# Patient Record
Sex: Female | Born: 1975
Health system: Southern US, Community
[De-identification: ages and names within clinical notes are randomized; demographics above are authoritative.]

## PROBLEM LIST (undated history)

## (undated) DIAGNOSIS — F411 Generalized anxiety disorder: Secondary | ICD-10-CM

## (undated) DIAGNOSIS — F419 Anxiety disorder, unspecified: Secondary | ICD-10-CM

## (undated) DIAGNOSIS — F3289 Other specified depressive episodes: Secondary | ICD-10-CM

## (undated) DIAGNOSIS — F329 Major depressive disorder, single episode, unspecified: Secondary | ICD-10-CM

## (undated) DIAGNOSIS — E119 Type 2 diabetes mellitus without complications: Secondary | ICD-10-CM

## (undated) DIAGNOSIS — I10 Essential (primary) hypertension: Secondary | ICD-10-CM

## (undated) DIAGNOSIS — K219 Gastro-esophageal reflux disease without esophagitis: Secondary | ICD-10-CM

## (undated) DIAGNOSIS — F410 Panic disorder [episodic paroxysmal anxiety] without agoraphobia: Secondary | ICD-10-CM

## (undated) DIAGNOSIS — E785 Hyperlipidemia, unspecified: Secondary | ICD-10-CM

## (undated) DIAGNOSIS — E669 Obesity, unspecified: Secondary | ICD-10-CM

## (undated) HISTORY — DX: Major depressive disorder, single episode, unspecified: F32.9

## (undated) HISTORY — DX: Hyperlipidemia, unspecified: E78.5

## (undated) HISTORY — DX: Panic disorder (episodic paroxysmal anxiety): F41.0

## (undated) HISTORY — DX: Gastro-esophageal reflux disease without esophagitis: K21.9

## (undated) HISTORY — DX: Obesity, unspecified: E66.9

## (undated) HISTORY — PX: NO PAST SURGERIES: SHX2092

## (undated) HISTORY — DX: Type 2 diabetes mellitus without complications: E11.9

## (undated) HISTORY — DX: Generalized anxiety disorder: F41.1

## (undated) HISTORY — DX: Other specified depressive episodes: F32.89

## (undated) HISTORY — DX: Anxiety disorder, unspecified: F41.9

---

## 1997-12-13 ENCOUNTER — Emergency Department (HOSPITAL_COMMUNITY): Admission: EM | Admit: 1997-12-13 | Discharge: 1997-12-13 | Payer: Self-pay | Admitting: Internal Medicine

## 1998-02-28 ENCOUNTER — Emergency Department (HOSPITAL_COMMUNITY): Admission: EM | Admit: 1998-02-28 | Discharge: 1998-02-28 | Payer: Self-pay

## 1998-03-04 ENCOUNTER — Emergency Department (HOSPITAL_COMMUNITY): Admission: EM | Admit: 1998-03-04 | Discharge: 1998-03-04 | Payer: Self-pay | Admitting: Internal Medicine

## 1998-05-04 ENCOUNTER — Emergency Department (HOSPITAL_COMMUNITY): Admission: EM | Admit: 1998-05-04 | Discharge: 1998-05-04 | Payer: Self-pay | Admitting: Emergency Medicine

## 1999-01-16 ENCOUNTER — Emergency Department (HOSPITAL_COMMUNITY): Admission: EM | Admit: 1999-01-16 | Discharge: 1999-01-16 | Payer: Self-pay | Admitting: Emergency Medicine

## 1999-07-01 ENCOUNTER — Emergency Department (HOSPITAL_COMMUNITY): Admission: EM | Admit: 1999-07-01 | Discharge: 1999-07-01 | Payer: Self-pay | Admitting: Emergency Medicine

## 2000-02-22 ENCOUNTER — Ambulatory Visit (HOSPITAL_COMMUNITY): Admission: RE | Admit: 2000-02-22 | Discharge: 2000-02-22 | Payer: Self-pay | Admitting: *Deleted

## 2000-03-22 ENCOUNTER — Inpatient Hospital Stay (HOSPITAL_COMMUNITY): Admission: AD | Admit: 2000-03-22 | Discharge: 2000-03-22 | Payer: Self-pay | Admitting: *Deleted

## 2000-03-27 ENCOUNTER — Ambulatory Visit (HOSPITAL_COMMUNITY): Admission: RE | Admit: 2000-03-27 | Discharge: 2000-03-27 | Payer: Self-pay | Admitting: *Deleted

## 2000-06-11 ENCOUNTER — Inpatient Hospital Stay (HOSPITAL_COMMUNITY): Admission: AD | Admit: 2000-06-11 | Discharge: 2000-06-11 | Payer: Self-pay | Admitting: *Deleted

## 2000-06-28 ENCOUNTER — Inpatient Hospital Stay (HOSPITAL_COMMUNITY): Admission: AD | Admit: 2000-06-28 | Discharge: 2000-07-01 | Payer: Self-pay | Admitting: *Deleted

## 2000-08-09 ENCOUNTER — Inpatient Hospital Stay (HOSPITAL_COMMUNITY): Admission: AD | Admit: 2000-08-09 | Discharge: 2000-08-09 | Payer: Self-pay | Admitting: *Deleted

## 2000-08-15 ENCOUNTER — Encounter (HOSPITAL_COMMUNITY): Admission: RE | Admit: 2000-08-15 | Discharge: 2000-08-31 | Payer: Self-pay | Admitting: *Deleted

## 2000-08-16 ENCOUNTER — Inpatient Hospital Stay (HOSPITAL_COMMUNITY): Admission: AD | Admit: 2000-08-16 | Discharge: 2000-08-16 | Payer: Self-pay | Admitting: *Deleted

## 2000-08-29 ENCOUNTER — Encounter (INDEPENDENT_AMBULATORY_CARE_PROVIDER_SITE_OTHER): Payer: Self-pay | Admitting: *Deleted

## 2000-08-29 ENCOUNTER — Inpatient Hospital Stay (HOSPITAL_COMMUNITY): Admission: AD | Admit: 2000-08-29 | Discharge: 2000-09-01 | Payer: Self-pay | Admitting: Obstetrics & Gynecology

## 2001-06-05 ENCOUNTER — Emergency Department (HOSPITAL_COMMUNITY): Admission: EM | Admit: 2001-06-05 | Discharge: 2001-06-05 | Payer: Self-pay

## 2001-08-20 ENCOUNTER — Emergency Department (HOSPITAL_COMMUNITY): Admission: EM | Admit: 2001-08-20 | Discharge: 2001-08-20 | Payer: Self-pay | Admitting: Emergency Medicine

## 2001-08-26 ENCOUNTER — Encounter: Admission: RE | Admit: 2001-08-26 | Discharge: 2001-11-24 | Payer: Self-pay | Admitting: Orthopedic Surgery

## 2001-09-26 ENCOUNTER — Emergency Department (HOSPITAL_COMMUNITY): Admission: EM | Admit: 2001-09-26 | Discharge: 2001-09-26 | Payer: Self-pay | Admitting: Emergency Medicine

## 2002-01-13 ENCOUNTER — Encounter: Payer: Self-pay | Admitting: Emergency Medicine

## 2002-01-13 ENCOUNTER — Emergency Department (HOSPITAL_COMMUNITY): Admission: EM | Admit: 2002-01-13 | Discharge: 2002-01-13 | Payer: Self-pay | Admitting: Emergency Medicine

## 2002-02-23 ENCOUNTER — Emergency Department (HOSPITAL_COMMUNITY): Admission: EM | Admit: 2002-02-23 | Discharge: 2002-02-23 | Payer: Self-pay | Admitting: Emergency Medicine

## 2002-04-15 ENCOUNTER — Emergency Department (HOSPITAL_COMMUNITY): Admission: EM | Admit: 2002-04-15 | Discharge: 2002-04-15 | Payer: Self-pay | Admitting: Emergency Medicine

## 2002-06-15 ENCOUNTER — Emergency Department (HOSPITAL_COMMUNITY): Admission: EM | Admit: 2002-06-15 | Discharge: 2002-06-15 | Payer: Self-pay | Admitting: Emergency Medicine

## 2002-06-15 ENCOUNTER — Encounter: Payer: Self-pay | Admitting: Emergency Medicine

## 2002-11-16 ENCOUNTER — Encounter: Payer: Self-pay | Admitting: Family Medicine

## 2002-11-16 ENCOUNTER — Ambulatory Visit (HOSPITAL_COMMUNITY): Admission: RE | Admit: 2002-11-16 | Discharge: 2002-11-16 | Payer: Self-pay | Admitting: Family Medicine

## 2003-04-14 ENCOUNTER — Emergency Department (HOSPITAL_COMMUNITY): Admission: EM | Admit: 2003-04-14 | Discharge: 2003-04-14 | Payer: Self-pay | Admitting: Emergency Medicine

## 2003-06-08 ENCOUNTER — Inpatient Hospital Stay (HOSPITAL_COMMUNITY): Admission: AD | Admit: 2003-06-08 | Discharge: 2003-06-09 | Payer: Self-pay | Admitting: *Deleted

## 2003-06-17 ENCOUNTER — Encounter: Admission: RE | Admit: 2003-06-17 | Discharge: 2003-06-17 | Payer: Self-pay | Admitting: Obstetrics and Gynecology

## 2003-07-11 ENCOUNTER — Inpatient Hospital Stay (HOSPITAL_COMMUNITY): Admission: AD | Admit: 2003-07-11 | Discharge: 2003-07-11 | Payer: Self-pay | Admitting: *Deleted

## 2003-07-29 ENCOUNTER — Encounter: Admission: RE | Admit: 2003-07-29 | Discharge: 2003-07-29 | Payer: Self-pay | Admitting: Obstetrics and Gynecology

## 2004-02-03 ENCOUNTER — Ambulatory Visit: Payer: Self-pay | Admitting: Obstetrics and Gynecology

## 2004-03-31 ENCOUNTER — Ambulatory Visit: Payer: Self-pay | Admitting: Family Medicine

## 2004-06-16 ENCOUNTER — Ambulatory Visit: Payer: Self-pay | Admitting: *Deleted

## 2004-07-20 ENCOUNTER — Emergency Department (HOSPITAL_COMMUNITY): Admission: EM | Admit: 2004-07-20 | Discharge: 2004-07-20 | Payer: Self-pay | Admitting: Emergency Medicine

## 2004-08-21 ENCOUNTER — Emergency Department (HOSPITAL_COMMUNITY): Admission: EM | Admit: 2004-08-21 | Discharge: 2004-08-21 | Payer: Self-pay | Admitting: Emergency Medicine

## 2004-09-01 ENCOUNTER — Ambulatory Visit: Payer: Self-pay | Admitting: Family Medicine

## 2004-11-17 ENCOUNTER — Ambulatory Visit: Payer: Self-pay | Admitting: *Deleted

## 2005-02-02 ENCOUNTER — Ambulatory Visit: Payer: Self-pay | Admitting: Obstetrics and Gynecology

## 2005-04-19 ENCOUNTER — Ambulatory Visit: Payer: Self-pay | Admitting: Obstetrics & Gynecology

## 2005-07-05 ENCOUNTER — Ambulatory Visit: Payer: Self-pay | Admitting: Family Medicine

## 2005-09-19 ENCOUNTER — Ambulatory Visit: Payer: Self-pay | Admitting: Gynecology

## 2005-12-06 ENCOUNTER — Ambulatory Visit: Payer: Self-pay | Admitting: Obstetrics & Gynecology

## 2006-02-11 ENCOUNTER — Emergency Department (HOSPITAL_COMMUNITY): Admission: EM | Admit: 2006-02-11 | Discharge: 2006-02-11 | Payer: Self-pay | Admitting: Emergency Medicine

## 2006-02-21 ENCOUNTER — Ambulatory Visit: Payer: Self-pay | Admitting: Obstetrics and Gynecology

## 2006-05-10 ENCOUNTER — Ambulatory Visit: Payer: Self-pay | Admitting: Gynecology

## 2006-07-26 ENCOUNTER — Ambulatory Visit: Payer: Self-pay | Admitting: Gynecology

## 2006-10-11 ENCOUNTER — Encounter: Payer: Self-pay | Admitting: Obstetrics and Gynecology

## 2006-10-11 ENCOUNTER — Ambulatory Visit: Payer: Self-pay | Admitting: Obstetrics and Gynecology

## 2006-12-26 ENCOUNTER — Ambulatory Visit: Payer: Self-pay | Admitting: Obstetrics and Gynecology

## 2007-03-13 ENCOUNTER — Ambulatory Visit: Payer: Self-pay | Admitting: Family Medicine

## 2007-05-04 ENCOUNTER — Emergency Department (HOSPITAL_COMMUNITY): Admission: EM | Admit: 2007-05-04 | Discharge: 2007-05-04 | Payer: Self-pay | Admitting: Emergency Medicine

## 2007-05-29 ENCOUNTER — Ambulatory Visit: Payer: Self-pay | Admitting: Obstetrics and Gynecology

## 2007-08-14 ENCOUNTER — Ambulatory Visit: Payer: Self-pay | Admitting: Obstetrics and Gynecology

## 2007-10-28 LAB — CONVERTED CEMR LAB

## 2007-10-30 ENCOUNTER — Ambulatory Visit: Payer: Self-pay | Admitting: Obstetrics and Gynecology

## 2007-10-30 ENCOUNTER — Encounter: Payer: Self-pay | Admitting: Obstetrics and Gynecology

## 2007-10-30 LAB — HM PAP SMEAR: HM Pap smear: NORMAL

## 2008-01-15 ENCOUNTER — Ambulatory Visit: Payer: Self-pay | Admitting: Obstetrics & Gynecology

## 2008-04-01 ENCOUNTER — Ambulatory Visit: Payer: Self-pay | Admitting: Obstetrics & Gynecology

## 2008-04-07 ENCOUNTER — Emergency Department (HOSPITAL_COMMUNITY): Admission: EM | Admit: 2008-04-07 | Discharge: 2008-04-07 | Payer: Self-pay | Admitting: Emergency Medicine

## 2008-04-15 ENCOUNTER — Ambulatory Visit: Payer: Self-pay | Admitting: Family Medicine

## 2008-05-18 ENCOUNTER — Ambulatory Visit: Payer: Self-pay | Admitting: Family Medicine

## 2008-05-18 DIAGNOSIS — F419 Anxiety disorder, unspecified: Secondary | ICD-10-CM | POA: Insufficient documentation

## 2008-05-18 DIAGNOSIS — F410 Panic disorder [episodic paroxysmal anxiety] without agoraphobia: Secondary | ICD-10-CM | POA: Insufficient documentation

## 2008-05-18 HISTORY — DX: Anxiety disorder, unspecified: F41.9

## 2008-06-17 ENCOUNTER — Ambulatory Visit: Payer: Self-pay | Admitting: Family Medicine

## 2008-07-20 ENCOUNTER — Ambulatory Visit: Payer: Self-pay | Admitting: Family Medicine

## 2008-07-20 DIAGNOSIS — F329 Major depressive disorder, single episode, unspecified: Secondary | ICD-10-CM

## 2008-07-20 DIAGNOSIS — F32A Depression, unspecified: Secondary | ICD-10-CM

## 2008-07-20 HISTORY — DX: Depression, unspecified: F32.A

## 2008-09-02 ENCOUNTER — Ambulatory Visit: Payer: Self-pay | Admitting: Obstetrics and Gynecology

## 2008-11-18 ENCOUNTER — Ambulatory Visit: Payer: Self-pay | Admitting: Obstetrics & Gynecology

## 2009-01-25 ENCOUNTER — Emergency Department (HOSPITAL_COMMUNITY): Admission: EM | Admit: 2009-01-25 | Discharge: 2009-01-25 | Payer: Self-pay | Admitting: Emergency Medicine

## 2009-02-03 ENCOUNTER — Ambulatory Visit: Payer: Self-pay | Admitting: Obstetrics and Gynecology

## 2009-04-21 ENCOUNTER — Ambulatory Visit: Payer: Self-pay | Admitting: Obstetrics and Gynecology

## 2009-05-19 ENCOUNTER — Encounter (INDEPENDENT_AMBULATORY_CARE_PROVIDER_SITE_OTHER): Payer: Self-pay | Admitting: *Deleted

## 2009-05-19 ENCOUNTER — Ambulatory Visit: Payer: Self-pay | Admitting: Internal Medicine

## 2009-05-19 DIAGNOSIS — K219 Gastro-esophageal reflux disease without esophagitis: Secondary | ICD-10-CM | POA: Insufficient documentation

## 2009-05-20 ENCOUNTER — Ambulatory Visit: Payer: Self-pay | Admitting: Internal Medicine

## 2009-05-20 DIAGNOSIS — E119 Type 2 diabetes mellitus without complications: Secondary | ICD-10-CM | POA: Insufficient documentation

## 2009-05-20 LAB — CONVERTED CEMR LAB
AST: 16 units/L (ref 0–37)
Alkaline Phosphatase: 54 units/L (ref 39–117)
Basophils Absolute: 0.1 10*3/uL (ref 0.0–0.1)
Basophils Relative: 0.9 % (ref 0.0–3.0)
CO2: 28 meq/L (ref 19–32)
Chloride: 106 meq/L (ref 96–112)
HCT: 44 % (ref 36.0–46.0)
Hemoglobin: 14.3 g/dL (ref 12.0–15.0)
Ketones, ur: NEGATIVE mg/dL
MCHC: 32.5 g/dL (ref 30.0–36.0)
MCV: 92.2 fL (ref 78.0–100.0)
Monocytes Absolute: 0.4 10*3/uL (ref 0.1–1.0)
Neutrophils Relative %: 63.5 % (ref 43.0–77.0)
Nitrite: NEGATIVE
RDW: 12.8 % (ref 11.5–14.6)
Sodium: 142 meq/L (ref 135–145)
Specific Gravity, Urine: >=1.03 (ref 1.000–1.030)
Total Bilirubin: 0.3 mg/dL (ref 0.3–1.2)
Total Protein, Urine: NEGATIVE mg/dL
Urobilinogen, UA: 0.2 (ref 0.0–1.0)
WBC: 8.7 10*3/uL (ref 4.5–10.5)

## 2009-05-25 ENCOUNTER — Telehealth: Payer: Self-pay | Admitting: Internal Medicine

## 2009-06-09 ENCOUNTER — Telehealth: Payer: Self-pay | Admitting: Internal Medicine

## 2009-06-12 ENCOUNTER — Encounter: Payer: Self-pay | Admitting: Internal Medicine

## 2009-06-27 ENCOUNTER — Encounter: Admission: RE | Admit: 2009-06-27 | Discharge: 2009-06-27 | Payer: Self-pay | Admitting: Internal Medicine

## 2009-06-28 ENCOUNTER — Encounter: Payer: Self-pay | Admitting: Internal Medicine

## 2009-07-07 ENCOUNTER — Ambulatory Visit: Payer: Self-pay | Admitting: Family Medicine

## 2009-08-03 ENCOUNTER — Emergency Department (HOSPITAL_COMMUNITY): Admission: EM | Admit: 2009-08-03 | Discharge: 2009-08-03 | Payer: Self-pay | Admitting: Emergency Medicine

## 2009-08-18 ENCOUNTER — Ambulatory Visit: Payer: Self-pay | Admitting: Internal Medicine

## 2009-08-19 LAB — CONVERTED CEMR LAB
Creatinine,U: 208.4 mg/dL
Hgb A1c MFr Bld: 7.2 % — ABNORMAL HIGH (ref 4.6–6.5)
Microalb Creat Ratio: 0.5 mg/g (ref 0.0–30.0)

## 2009-09-19 ENCOUNTER — Ambulatory Visit: Payer: Self-pay | Admitting: Internal Medicine

## 2009-09-19 DIAGNOSIS — J0110 Acute frontal sinusitis, unspecified: Secondary | ICD-10-CM | POA: Insufficient documentation

## 2009-09-22 ENCOUNTER — Ambulatory Visit: Payer: Self-pay | Admitting: Obstetrics & Gynecology

## 2009-11-02 ENCOUNTER — Ambulatory Visit: Payer: Self-pay | Admitting: Internal Medicine

## 2009-11-02 DIAGNOSIS — H6120 Impacted cerumen, unspecified ear: Secondary | ICD-10-CM | POA: Insufficient documentation

## 2009-12-08 ENCOUNTER — Ambulatory Visit: Payer: Self-pay | Admitting: Obstetrics & Gynecology

## 2009-12-15 ENCOUNTER — Telehealth: Payer: Self-pay | Admitting: Internal Medicine

## 2009-12-15 ENCOUNTER — Ambulatory Visit: Payer: Self-pay | Admitting: Internal Medicine

## 2009-12-22 ENCOUNTER — Ambulatory Visit (HOSPITAL_COMMUNITY): Admission: RE | Admit: 2009-12-22 | Discharge: 2009-12-22 | Payer: Self-pay | Admitting: Obstetrics & Gynecology

## 2010-01-17 ENCOUNTER — Telehealth (INDEPENDENT_AMBULATORY_CARE_PROVIDER_SITE_OTHER): Payer: Self-pay | Admitting: *Deleted

## 2010-01-26 ENCOUNTER — Ambulatory Visit: Payer: Self-pay | Admitting: Obstetrics and Gynecology

## 2010-01-30 ENCOUNTER — Encounter: Payer: Self-pay | Admitting: Internal Medicine

## 2010-02-23 ENCOUNTER — Ambulatory Visit: Payer: Self-pay | Admitting: Obstetrics and Gynecology

## 2010-03-01 ENCOUNTER — Telehealth: Payer: Self-pay | Admitting: Internal Medicine

## 2010-03-14 ENCOUNTER — Ambulatory Visit
Admission: RE | Admit: 2010-03-14 | Discharge: 2010-03-14 | Payer: Self-pay | Source: Home / Self Care | Attending: Internal Medicine | Admitting: Internal Medicine

## 2010-03-14 ENCOUNTER — Other Ambulatory Visit: Payer: Self-pay | Admitting: Internal Medicine

## 2010-03-14 LAB — HEMOGLOBIN A1C: Hgb A1c MFr Bld: 7.4 % — ABNORMAL HIGH (ref 4.6–6.5)

## 2010-03-28 NOTE — Letter (Signed)
Summary: Generic Letter  [facility]  [address] [place], [place] [address]   Phone: [phone]  Fax: [phone]    05/19/2009  Aslan Hardaway [address] [place], [place]  [address]  To whom it may concern,  When patient (Emily [name]) experience panic attacks please allow 20 minutes time for medical exception to treat her condition.     Sincerely,   Dr. Rene Paci

## 2010-03-28 NOTE — Assessment & Plan Note (Signed)
Summary: NEW [employer] PT--PKG/GIVEN--STC   Vital Signs:  Patient profile:   35 year old female Height:      64 inches (162.56 cm) Weight:      247.0 pounds (112.27 kg) BMI:     42.55 O2 Sat:      98 % on Room air Temp:     98.1 degrees F (36.72 degrees C) oral Pulse rate:   79 / minute BP sitting:   124 / 88  (left arm) Cuff size:   large  Vitals Entered By: Orlan Leavens (May 19, 2009 8:14 AM)  O2 Flow:  Room air CC: New patient Is Patient Diabetic? No Pain Assessment Patient in pain? no        Primary Care Provider:  Newt Lukes MD  CC:  New patient.  History of Present Illness: new pt to me and our practice - here to est care patient is here today for annual physical. Patient feels well and has no complaints.   needs refills on meds needs letter for work re: panic attacks and permission for medical time out as needed for tx of same GERD - well controlled, no reflux flares or abd pain obesity - working at gym with trainer - water aerobics, weights; diet subobtimal anxiety w/ panic attacks - 1-2 episodes per mo - no escalation of symptoms or attacks  Preventive Screening-Counseling & Management  Alcohol-Tobacco     Alcohol drinks/day: 0     Alcohol Counseling: not indicated; patient does not drink     Smoking Status: never     Tobacco Counseling: not indicated; no tobacco use  Caffeine-Diet-Exercise     Diet Counseling: to improve diet; diet is suboptimal     Does Patient Exercise: yes     Exercise Counseling: to improve exercise regimen     Depression Counseling: not indicated; screening negative for depression  Safety-Violence-Falls     Seat Belt Use: yes     Seat Belt Counseling: not indicated; patient wears seat belts     Helmet Use: n/a     Firearms in the Home: no firearms in the home     Smoke Detectors: yes     Violence in the Home: no risk noted     Sexual Abuse: no     Fall Risk Counseling: not indicated; no significant falls  noted  Clinical Review Panels:  Prevention   Last Pap Smear:  NEGATIVE FOR INTRAEPITHELIAL LESIONS OR MALIGNANCY. (10/30/2007)  Immunizations   Last Tetanus Booster:  Tdap (05/19/2009)   Current Medications (verified): 1)  Ativan 0.5 Mg Tabs (Lorazepam) .... Take 1 Tablet Every 8 Hours As Needed 2)  Omeprazole 20 Mg Tbec (Omeprazole) .... Take 1 Tablet Two Times A Day 3)  Medroxyprogesterone Acetate 150 Mg/ml Susp (Medroxyprogesterone Acetate) .Marland Kitchen.. 1 Im Q 3 Mos  Allergies (verified): No Known Drug Allergies  Past History:  Past Medical History: Anemia  Depression/anxiety with panic attacks Eczema GERD morbid obesiety  [facility]  Past Surgical History: Denies surgical history  Social History: Has 20 yr old son Dorma Russell who lives with her.  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 I have reviewed all other systems and they were negative.   Physical Exam  General:  overweight-appearing.  alert, well-developed, well-nourished, and cooperative to examination.   sister at side Eyes:  vision grossly intact; pupils equal, round and reactive to light.  conjunctiva and lids normal.    Ears:  normal pinnae bilaterally, without erythema, swelling, or tenderness to palpation. TMs clear, without effusion, or cerumen impaction. Hearing grossly normal bilaterally  Mouth:  teeth and gums in good repair; mucous membranes moist, without lesions or ulcers. oropharynx clear without exudate,  no erythema.  Neck:  thick,  supple, full ROM, no masses, no thyromegaly; no thyroid nodules or tenderness. no JVD or carotid bruits.   Lungs:  normal respiratory effort, no intercostal retractions or use of accessory muscles; normal breath sounds bilaterally - no crackles and no wheezes.    Heart:  normal rate, regular rhythm, no murmur, and no rub. BLE without edema. normal DP pulses and normal  cap refill in all 4 extremities    Abdomen:  soft, non-tender, normal bowel sounds, no distention; no masses and no appreciable hepatomegaly or splenomegaly.   Genitalia:  defer to gyn Msk:  No deformity or scoliosis noted of thoracic or lumbar spine.   Neurologic:  alert & oriented X3 and cranial nerves II-XII symetrically intact.  strength normal in all extremities, sensation intact to light touch, and gait normal. speech fluent without dysarthria or aphasia; follows commands with good comprehension.  Skin:  acanthosis nigrans at nape of neck- no rashes, vesicles, ulcers, or erythema. No nodules or irregularity to palpation.  Psych:  Oriented X3, memory intact for recent and remote, normally interactive, good eye contact, not anxious appearing, not depressed appearing, and not agitated.      Impression & Recommendations:  Problem # 1:  PREVENTIVE HEALTH CARE (ICD-V70.0) Patient has been counseled on age-appropriate routine health concerns for screening and prevention. These are reviewed and up-to-date. Immunizations are up-to-date or declined. Labs to be done in AM when fasting, then reviewed.   Problem # 2:  ANXIETY (ICD-300.00) letter provided as per pt request for time at work as needed for panic attack tx The following medications were removed from the medication list:    Zoloft 50 Mg Tabs (Sertraline hcl) .Marland Kitchen... 1/2 tab by mouth daily x 1 wk then 1 by mouth daily Her updated medication list for this problem includes:    Ativan 0.5 Mg Tabs (Lorazepam) .Marland Kitchen... Take 1 tablet every 8 hours as needed  Problem # 3:  GERD (ICD-530.81)  Her updated medication list for this problem includes:    Omeprazole 20 Mg Tbec (Omeprazole) .Marland Kitchen... Take 1 tablet two times a day  Problem # 4:  OBESITY, UNSPECIFIED (ICD-278.00) education and counseling on diet and exercise Ht: 64 (05/19/2009)   Wt: 247.0 (05/19/2009)   BMI: 42.55 (05/19/2009)  Problem # 5:  FAMILY HISTORY OF ASTHMA (ICD-V17.5) pt concenred  about FH of sarcoid and BOOP -  exam benign and no pulm symptoms present at this time - consider need for CXR or pulm eval at future date  Complete Medication List: 1)  Ativan 0.5 Mg Tabs (Lorazepam) .... Take 1 tablet every 8 hours as needed 2)  Omeprazole 20 Mg Tbec (Omeprazole) .... Take 1 tablet two times a day 3)  Medroxyprogesterone Acetate 150 Mg/ml Susp (Medroxyprogesterone acetate) .Marland Kitchen.. 1 im q 3 mos 4)  Oscal 500/200 D-3 500-200 Mg-unit Tabs (Calcium-vitamin d) .... 2 by mouth two times a day  Other Orders: Tdap => 56yrs IM ([address]) Admin 1st Vaccine (60454)  Patient Instructions: 1)  it was good to see you today. 2)  return in AM fasting (nothing to eat to drink  for 8 hours) to fo physical lab work - your results will be posted on the phone tree for review in 48-72 hours from the time of test completion; call [phone] and enter your 9 digit MRN (listed above on this page, just below your name); if any changes need to be made or there are abnormal results, you will be contacted directly.  3)  letter for work regarding panic attacks as requested 4)  calcium recommendation as below 5)  followup with gynecology for your PAP/pelvic and depo shots 6)  it is important that you continue to work on losing weight as you are doing - monitor your diet and consume fewer calories such as less carbohydrates (sugar) and less fat. you also need to continue to increase your physical activity level -work with your trainer on this as you are doing 7)  refills on medications today 8)  Please schedule a follow-up appointment in 6 months, sooner if problems, sooner if problems.  Prescriptions: OMEPRAZOLE 20 MG TBEC (OMEPRAZOLE) take 1 tablet two times a day  #60 x 5   Entered and Authorized by:   Newt Lukes MD   Signed by:   Newt Lukes MD on 05/19/2009   Method used:   Print then Give to Patient   RxID:   [id number] ATIVAN 0.5 MG TABS (LORAZEPAM) take 1 tablet every 8 hours as  needed  #30 x 2   Entered and Authorized by:   Newt Lukes MD   Signed by:   Newt Lukes MD on 05/19/2009   Method used:   Print then Give to Patient   RxID:   [id number]    Immunizations Administered:  Tetanus Vaccine:    Vaccine Type: Tdap    Site: right deltoid    Mfr: GlaxoSmithKline    Dose: 0.5 ml    Route: IM    Given by: Orlan Leavens    Exp. Date: 12/22/2009    Lot #: [id number]    VIS given: 05/19/09    Pap Smear  Procedure date:  10/28/2007  Findings:      Pt states [facility] Interpretation/Result:Negative for intraepithelial Lesion or Malignancy, she alos stated had a pelvic check in 10/2008

## 2010-03-28 NOTE — Assessment & Plan Note (Signed)
Summary: PER PT 4 MTH FU--STC   Vital Signs:  Patient profile:   35 year old female Height:      64 inches (162.56 cm) Weight:      246.8 pounds (112.18 kg) O2 Sat:      99 % on Room air Temp:     98.7 degrees F (37.06 degrees C) oral Pulse rate:   72 / minute BP sitting:   124 / 82  (left arm) Cuff size:   large  Vitals Entered By: Orlan Leavens RMA (December 15, 2009 8:16 AM)  O2 Flow:  Room air CC: 4 month follow-up Is Patient Diabetic? Yes Did you bring your meter with you today? No Pain Assessment Patient in pain? no      CBG Result 138 CBG Device ID pt check this am at home Comments Decline flu shot   Primary Care Provider:  Newt Lukes MD  CC:  4 month follow-up.  History of Present Illness: c/o left ear feeling clogged - onset 4 days ago - no ear pain, no drainage - no fever or tooth pain- denies placing foreign body into ears  also reviewed prior OV: 1) DM2 - new dx 3/2011at [facility] - has been to a nutrition class, planning more. checks sugars 1-2x/day - range 110-210 - inc metformin dose 07/2009- reports compliance with ongoing medical treatment and no changes in medication dose or frequency. denies adverse side effects related to current therapy. no signs or symptoms hypogylcemia  2) anxiety/ panic attacks - continues to use medical "time out" at work as needed for tx of same "meditation time calms me down"; 1-2 episodes per mo - no escalation of symptoms or attacks, even with recent death of family member (uncle, dad)  3)GERD - well controlled, no reflux flares or abd pain -  4) obesity - working at gym with trainer, but only sporadic due to time restraints - water aerobics, weights; diet still subobtimal  Clinical Review Panels:  Immunizations   Last Tetanus Booster:  Tdap (05/19/2009)  Lipid Management   Cholesterol:  171 (05/20/2009)   LDL (bad choesterol):  112 (05/20/2009)   HDL (good cholesterol):  34.20 (05/20/2009)  Diabetes Management  HgBA1C:  7.2 (08/18/2009)   Creatinine:  0.8 (05/20/2009)  CBC   WBC:  8.7 (05/20/2009)   RBC:  4.77 (05/20/2009)   Hgb:  14.3 (05/20/2009)   Hct:  44.0 (05/20/2009)   Platelets:  274.0 (05/20/2009)   MCV  92.2 (05/20/2009)   MCHC  32.5 (05/20/2009)   RDW  12.8 (05/20/2009)   PMN:  63.5 (05/20/2009)   Lymphs:  30.3 (05/20/2009)   Monos:  4.7 (05/20/2009)   Eosinophils:  0.6 (05/20/2009)   Basophil:  0.9 (05/20/2009)  Complete Metabolic Panel   Glucose:  126 (05/20/2009)   Sodium:  142 (05/20/2009)   Potassium:  4.0 (05/20/2009)   Chloride:  106 (05/20/2009)   CO2:  28 (05/20/2009)   BUN:  5 (05/20/2009)   Creatinine:  0.8 (05/20/2009)   Albumin:  3.8 (05/20/2009)   Total Protein:  7.5 (05/20/2009)   Calcium:  9.4 (05/20/2009)   Total Bili:  0.3 (05/20/2009)   Alk Phos:  54 (05/20/2009)   SGPT (ALT):  23 (05/20/2009)   SGOT (AST):  16 (05/20/2009)   Current Medications (verified): 1)  Ativan 0.5 Mg Tabs (Lorazepam) .... Take 1 Tablet Every 8 Hours As Needed 2)  Omeprazole 20 Mg Tbec (Omeprazole) .... Take 1 Tablet Two Times A Day 3)  Medroxyprogesterone  Acetate 150 Mg/ml Susp (Medroxyprogesterone Acetate) .Marland Kitchen.. 1 Im Q 3 Mos 4)  Metformin Hcl 500 Mg Tabs (Metformin Hcl) .Marland Kitchen.. 1 By Mouth Two Times A Day 5)  Onetouch Ultra Test  Strp (Glucose Blood) .... Check Blood Sugar Two Times A Day  Dx: 250.00 6)  Onetouch Lancets  Misc (Lancets) .... Use Two Times A Day  Dx: 250.00 7)  Viactiv Multi-Vitamin  Chew (Multiple Vitamins-Calcium) .... Take 1 Three Times A Day 8)  Tylenol Sinus Max St 30-500 Mg Tabs (Pseudoephedrine-Apap) .Marland Kitchen.. 1 or 2 By Mouth Three Times A Day As Needed For Sinus and Headache Symptoms  Allergies (verified): No Known Drug Allergies  Past History:  Past Medical History: Anemia  Depression/anxiety with panic attacks Eczema GERD morbid obesity diabetes mellitus type 2    MD roster:   [facility]  Family History: Family History of  CAD Female 1st degree relative <50- father had CABG and angioplasty around age 72 Family History Diabetes 1st degree relative- father - age 36 Pulmonary fibrosis - sister at age 22 Sarcoidosis - mother age 14 Family History of Asthma- mother and sister  dad expired age 13s due to CAD/MI  Social History: Has 64 yr old son Dorma Russell who lives with her.  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    Physical Exam  General:  overweight-appearing.  alert, well-developed, well-nourished, and cooperative to examination. Lungs:  normal respiratory effort, no intercostal retractions or use of accessory muscles; normal breath sounds bilaterally - no crackles and no wheezes.    Heart:  normal rate, regular rhythm, no murmur, and no rub. BLE without edema.    Impression & Recommendations:  Problem # 1:  DIABETES MELLITUS, TYPE II (ICD-250.00)  Her updated medication list for this problem includes:    Metformin Hcl 500 Mg Tabs (Metformin hcl) .Marland Kitchen... 1 by mouth two times a day  Orders: TLB-A1C / Hgb A1C (Glycohemoglobin) (83036-A1C)  new dx 04/2009 cont nutrition followup -  need for weight loss rviewed and encouraged cont metformin , consider inc dose  Labs Reviewed: Creat: 0.8 (05/20/2009)    Reviewed HgBA1c results: 7.2 (08/18/2009)  7.3 (05/20/2009)  Problem # 2:  DEPRESSION (ICD-311) stable off zoloft even with family stressors (dad's death 16-Nov-2009) support offered Her updated medication list for this problem includes:    Ativan 0.5 Mg Tabs (Lorazepam) .Marland Kitchen... Take 1 tablet every 8 hours as needed  Problem # 3:  ANXIETY (ICD-300.00)  Her updated medication list for this problem includes:    Ativan 0.5 Mg Tabs (Lorazepam) .Marland Kitchen... Take 1 tablet every 8 hours as neede  on zolft prev but now controls with meditation and as needed ativan -  letter  previously provided 04/2009 as per pt request for time at work as needed for panic attack tx  Problem # 4:  OBESITY, UNSPECIFIED (ICD-278.00)  education and counseling on diet and exercise Ht: 64 (05/19/2009)   Wt: 247.0 (05/19/2009)   BMI: 42.55 (05/19/2009)  Ht: 64 (08/18/2009)   Wt: 247.4 (08/18/2009)   Ht: 64 (11/02/2009)   Wt: 247 (11/02/2009)   BMI: 42.55 (05/19/2009)  Ht: 64 (12/15/2009)   Wt: 246.8 (12/15/2009)   BMI: 42.55 (05/19/2009)  Complete Medication List: 1)  Ativan 0.5 Mg Tabs (Lorazepam) .... Take 1 tablet every 8 hours as needed 2)  Omeprazole  20 Mg Tbec (Omeprazole) .... Take 1 tablet two times a day 3)  Medroxyprogesterone Acetate 150 Mg/ml Susp (Medroxyprogesterone acetate) .Marland Kitchen.. 1 im q 3 mos 4)  Metformin Hcl 500 Mg Tabs (Metformin hcl) .Marland Kitchen.. 1 by mouth two times a day 5)  Onetouch Ultra Test Strp (Glucose blood) .... Check blood sugar two times a day  dx: 250.00 6)  Onetouch Lancets Misc (Lancets) .... Use two times a day  dx: 250.00 7)  Viactiv Multi-vitamin Chew (Multiple vitamins-calcium) .... Take 1 three times a day 8)  Tylenol Sinus Max St 30-500 Mg Tabs (Pseudoephedrine-apap) .Marland Kitchen.. 1 or 2 by mouth three times a day as needed for sinus and headache symptoms  Patient Instructions: 1)  it was good to see you today. 2)  test(s) ordered today - your results will be posted on the phone tree for review in 48-72 hours from the time of test completion; call [phone] and enter your 9 digit MRN (listed above on this page, just below your name); if any changes need to be made or there are abnormal results, you will be contacted directly. 3)  medications reviewed, no changes today 4)  Please schedule a follow-up appointment in 3-4 months to monitor diabetes and weight, call sooner if problems.    Orders Added: 1)  Est. Patient Level IV [91478] 2)  TLB-A1C / Hgb A1C (Glycohemoglobin) [83036-A1C]

## 2010-03-28 NOTE — Assessment & Plan Note (Signed)
Summary: 3 MTH FU--STC  RS'D PER PHONE CALL FROM NURSE/NWS   Vital Signs:  Patient profile:   35 year old female Height:      64 inches (162.56 cm) Weight:      247.4 pounds (112.45 kg) O2 Sat:      99 % on Room air Temp:     97.9 degrees F (36.61 degrees C) oral Pulse rate:   77 / minute BP sitting:   130 / 82  (left arm) Cuff size:   large  Vitals Entered By: Orlan Leavens (August 18, 2009 8:18 AM)  O2 Flow:  Room air CC: 3 month follow-up Is Patient Diabetic? Yes Did you bring your meter with you today? Yes Pain Assessment Patient in pain? no        Primary Care Provider:  Newt Lukes MD  CC:  3 month follow-up.  History of Present Illness:  here for followup -  1) DM2 - new dx 3/2011at [facility] - has been to a nutrition class, planning more. checks sugars 1-2x/day - range 110-210 - reports compliance with ongoing medical treatment and no changes in medication dose or frequency. denies adverse side effects related to current therapy.   2) anxiety/ panic attacks  continues to use medical "time out" at work as needed for tx of same "meditation time calms me down" 1-2 episodes per mo - no escalation of symptoms or attacks, even with recent death of family member (uncle)  3)GERD - well controlled, no reflux flares or abd pain  4) obesity - working at gym with trainer, but only sporadic due to time restraints - water aerobics, weights; diet still subobtimal  Current Medications (verified): 1)  Ativan 0.5 Mg Tabs (Lorazepam) .... Take 1 Tablet Every 8 Hours As Needed 2)  Omeprazole 20 Mg Tbec (Omeprazole) .... Take 1 Tablet Two Times A Day 3)  Medroxyprogesterone Acetate 150 Mg/ml Susp (Medroxyprogesterone Acetate) .Marland Kitchen.. 1 Im Q 3 Mos 4)  Oscal 500/200 D-3 500-200 Mg-Unit Tabs (Calcium-Vitamin D) .... 2 By Mouth Two Times A Day 5)  Metformin Hcl 500 Mg Tabs (Metformin Hcl) .Marland Kitchen.. 1 By Mouth  Every Morning 6)  Onetouch Ultra Test  Strp (Glucose Blood) .... Check Blood Sugar  Two Times A Day  Dx: 250.00 7)  Onetouch Lancets  Misc (Lancets) .... Use Two Times A Day  Dx: 250.00  Allergies (verified): No Known Drug Allergies  Past History:  Past Medical History: Anemia  Depression/anxiety with panic attacks Eczema GERD morbid obesity diabetes mellitus type 2  MD roster: [facility]  Review of Systems  The patient denies weight loss, weight gain, chest pain, and headaches.    Physical Exam  General:  overweight-appearing.  alert, well-developed, well-nourished, and cooperative to examination.  Lungs:  normal respiratory effort, no intercostal retractions or use of accessory muscles; normal breath sounds bilaterally - no crackles and no wheezes.    Heart:  normal rate, regular rhythm, no murmur, and no rub. BLE without edema.    Impression & Recommendations:  Problem # 1:  DIABETES MELLITUS, TYPE II (ICD-250.00)  new dx 04/2009 home log revewed - cont nutrition followup -  need for weight loss rviewed and encouraged inc metformin  check labs including microalb -  pt to f/u with eye doc this summer   Her updated medication list for this problem includes:    Metformin Hcl 500 Mg Tabs (Metformin hcl) .Marland Kitchen... 1 by mouth two times a day  Labs  Reviewed: Creat: 0.8 (05/20/2009)    Reviewed HgBA1c results: 7.3 (05/20/2009)  Orders: TLB-A1C / Hgb A1C (Glycohemoglobin) (83036-A1C) TLB-Microalbumin/Creat Ratio, Urine (82043-MALB)  Problem # 2:  OBESITY, UNSPECIFIED (ICD-278.00)  education and counseling on diet and exercise Ht: 64 (05/19/2009)   Wt: 247.0 (05/19/2009)   BMI: 42.55 (05/19/2009)  Ht: 64 (08/18/2009)   Wt: 247.4 (08/18/2009)   Problem # 3:  ANXIETY (ICD-300.00) on zolft prev but now controls with meditation and as needed ativan -  Her updated medication list for this problem includes:    Ativan 0.5 Mg Tabs (Lorazepam) .Marland Kitchen... Take 1 tablet every 8 hours as needed  letter previously provided 04/2009 as per pt  request for time at work as needed for panic attack tx  Complete Medication List: 1)  Ativan 0.5 Mg Tabs (Lorazepam) .... Take 1 tablet every 8 hours as needed 2)  Omeprazole 20 Mg Tbec (Omeprazole) .... Take 1 tablet two times a day 3)  Medroxyprogesterone Acetate 150 Mg/ml Susp (Medroxyprogesterone acetate) .Marland Kitchen.. 1 im q 3 mos 4)  Oscal 500/200 D-3 500-200 Mg-unit Tabs (Calcium-vitamin d) .... 2 by mouth two times a day 5)  Metformin Hcl 500 Mg Tabs (Metformin hcl) .Marland Kitchen.. 1 by mouth two times a day 6)  Onetouch Ultra Test Strp (Glucose blood) .... Check blood sugar two times a day  dx: 250.00 7)  Onetouch Lancets Misc (Lancets) .... Use two times a day  dx: 250.00  Patient Instructions: 1)  it was good to see you today. 2)  test(s) ordered today - your results will be posted on the phone tree for review in 48-72 hours from the time of test completion; call [phone] and enter your 9 digit MRN (listed above on this page, just below your name); if any changes need to be made or there are abnormal results, you will be contacted directly. 3)  increase the metformin as discussed - your prescriptions have been electronically submitted to your pharmacy. Please take as directed. Contact our office if you believe you're having problems with the medication(s).  4)  it is important that continue to work on losing weight - monitor your diet and consume fewer calories such as less carbohydrates (sugar) and less fat. you also need to increase your physical activity level - start by walking for 10-20 minutes 3 times per week and work up to 30 minutes 4-5 times each week.  5)  Please schedule a follow-up appointment in 4 months, sooner if problems.  6)  See your eye doctor yearly to check for diabetic eye damage. Prescriptions: METFORMIN HCL 500 MG TABS (METFORMIN HCL) 1 by mouth two times a day  #60 x 3   Entered and Authorized by:   Newt Lukes MD   Signed by:   Newt Lukes MD on 08/18/2009    Method used:   Electronically to        [facility] (retail)       [address]       [place], [place]  [address]       Ph: [phone]       Fax: [phone]   RxID:   [id number]

## 2010-03-28 NOTE — Assessment & Plan Note (Signed)
Summary: ?sinus infection/cd   Vital Signs:  Patient profile:   35 year old female Height:      64 inches (162.56 cm) Weight:      247 pounds (112.27 kg) O2 Sat:      97 % on Room air Temp:     98.3 degrees F (36.83 degrees C) oral Pulse rate:   86 / minute BP sitting:   130 / 82  (left arm) Cuff size:   large  Vitals Entered By: Orlan Leavens RMA (September 19, 2009 3:32 PM)  O2 Flow:  Room air CC: ? sinus infection, URI symptoms Is Patient Diabetic? Yes Did you bring your meter with you today? No Pain Assessment Patient in pain? no        Primary Care Provider:  Newt Lukes MD  CC:  ? sinus infection and URI symptoms.  History of Present Illness:  URI Symptoms      This is a 35 year old woman who presents with URI symptoms.  The symptoms began 3 days ago.  The severity is described as moderate.  The patient reports nasal congestion, purulent nasal discharge, and earache, but denies sore throat, dry cough, productive cough, and sick contacts.  The patient denies fever, stiff neck, dyspnea, wheezing, rash, vomiting, diarrhea, and use of an antipyretic.  The patient also reports headache and severe fatigue.  The patient denies itchy watery eyes, itchy throat, sneezing, seasonal symptoms, and muscle aches.  Risk factors for Strep sinusitis include unilateral facial pain and absence of cough.  The patient denies the following risk factors for Strep sinusitis: double sickening, tooth pain, and tender adenopathy.     also reviewed prior OV: 1) DM2 - new dx 3/2011at [facility] - has been to a nutrition class, planning more. checks sugars 1-2x/day - range 110-210 - reports compliance with ongoing medical treatment and no changes in medication dose or frequency. denies adverse side effects related to current therapy.   2) anxiety/ panic attacks  continues to use medical "time out" at work as needed for tx of same "meditation time calms me down" 1-2 episodes per mo - no escalation of  symptoms or attacks, even with recent death of family member (uncle)  3)GERD - well controlled, no reflux flares or abd pain  4) obesity - working at gym with trainer, but only sporadic due to time restraints - water aerobics, weights; diet still subobtimal  Current Medications (verified): 1)  Ativan 0.5 Mg Tabs (Lorazepam) .... Take 1 Tablet Every 8 Hours As Needed 2)  Omeprazole 20 Mg Tbec (Omeprazole) .... Take 1 Tablet Two Times A Day 3)  Medroxyprogesterone Acetate 150 Mg/ml Susp (Medroxyprogesterone Acetate) .Marland Kitchen.. 1 Im Q 3 Mos 4)  Metformin Hcl 500 Mg Tabs (Metformin Hcl) .Marland Kitchen.. 1 By Mouth Two Times A Day 5)  Onetouch Ultra Test  Strp (Glucose Blood) .... Check Blood Sugar Two Times A Day  Dx: 250.00 6)  Onetouch Lancets  Misc (Lancets) .... Use Two Times A Day  Dx: 250.00 7)  Viactiv Multi-Vitamin  Chew (Multiple Vitamins-Calcium) .... Take 1 Three Times A Day  Allergies (verified): No Known Drug Allergies  Past History:  Past Medical History: Anemia  Depression/anxiety with panic attacks Eczema GERD morbid obesity diabetes mellitus type 2   MD roster: [facility] - [facility] ([facility])  Review of Systems  The patient denies chest pain, syncope, hemoptysis, abdominal pain, and severe indigestion/heartburn.    Physical Exam  General:  overweight-appearing.  alert, well-developed,  well-nourished, and cooperative to examination. mildly ill and very fatigued Eyes:  vision grossly intact; pupils equal, round and reactive to light.  conjunctiva and lids normal.    Ears:  normal pinnae bilaterally, without erythema, swelling, or tenderness to palpation. TMs clear, without effusion, or cerumen impaction. Hearing grossly normal bilaterally  Nose:  +frontal sinus tenderness to palp over right side Mouth:  teeth and gums in good repair; mucous membranes moist, without lesions or ulcers. oropharynx clear without exudate, erythema.  Lungs:  normal respiratory effort, no intercostal  retractions or use of accessory muscles; normal breath sounds bilaterally - no crackles and no wheezes.    Heart:  normal rate, regular rhythm, no murmur, and no rub. BLE without edema.  Neurologic:  alert & oriented X3 and cranial nerves II-XII symetrically intact.  strength normal in all extremities, sensation intact to light touch, and gait normal. speech fluent without dysarthria or aphasia; follows commands with good comprehension.    Impression & Recommendations:  Problem # 1:  ACUTE FRONTAL SINUSITIS (ICD-461.1)  Her updated medication list for this problem includes:    Amoxicillin-pot Clavulanate 875-125 Mg Tabs (Amoxicillin-pot clavulanate) .Marland Kitchen... 1 by mouth two times a day x 7 days    Tylenol Sinus Max St 30-500 Mg Tabs (Pseudoephedrine-apap) .Marland Kitchen... 1 or 2 by mouth three times a day as needed for sinus and headache symptoms  Instructed on treatment. Call if symptoms persist or worsen.   Orders: Prescription Created Electronically [phone])  Complete Medication List: 1)  Ativan 0.5 Mg Tabs (Lorazepam) .... Take 1 tablet every 8 hours as needed 2)  Omeprazole 20 Mg Tbec (Omeprazole) .... Take 1 tablet two times a day 3)  Medroxyprogesterone Acetate 150 Mg/ml Susp (Medroxyprogesterone acetate) .Marland Kitchen.. 1 im q 3 mos 4)  Metformin Hcl 500 Mg Tabs (Metformin hcl) .Marland Kitchen.. 1 by mouth two times a day 5)  Onetouch Ultra Test Strp (Glucose blood) .... Check blood sugar two times a day  dx: 250.00 6)  Onetouch Lancets Misc (Lancets) .... Use two times a day  dx: 250.00 7)  Viactiv Multi-vitamin Chew (Multiple vitamins-calcium) .... Take 1 three times a day 8)  Amoxicillin-pot Clavulanate 875-125 Mg Tabs (Amoxicillin-pot clavulanate) .Marland Kitchen.. 1 by mouth two times a day x 7 days 9)  Tylenol Sinus Max St 30-500 Mg Tabs (Pseudoephedrine-apap) .Marland Kitchen.. 1 or 2 by mouth three times a day as needed for sinus and headache symptoms  Patient Instructions: 1)  it was good to see you today. 2)  Augmentin antibioitcs  and tylenol sinus for your sinus symptoms as discussed - your antibiotic prescription has been electronically submitted to your pharmacy and printed presciption for flex card use given to you for tylenol sinus. Please take all as directed. Contact our office if you believe you're having problems with the medication(s).  3)  Get plenty of rest, drink lots of clear liquids, and use Ibuprofen for fever and comfort (if unrelieved with tylenol sinus). Return in 7-10 days if you're not better:sooner if you're feeling worse. Prescriptions: TYLENOL SINUS MAX ST 30-500 MG TABS (PSEUDOEPHEDRINE-APAP) 1 or 2 by mouth three times a day as needed for sinus and headache symptoms  #20 x 0   Entered and Authorized by:   Newt Lukes MD   Signed by:   Newt Lukes MD on 09/19/2009   Method used:   Print then Give to Patient   RxID:   [id number] AMOXICILLIN-POT CLAVULANATE 875-125 MG TABS (AMOXICILLIN-POT CLAVULANATE) 1 by mouth two  times a day x 7 days  #14 x 0   Entered and Authorized by:   Newt Lukes MD   Signed by:   Newt Lukes MD on 09/19/2009   Method used:   Electronically to        [facility] (retail)       [address]       [place], [place]  [address]       Ph: [phone]       Fax: [phone]   RxID:   [id number]

## 2010-03-28 NOTE — Progress Notes (Signed)
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 call when form is ready for pick-up. Initial call taken by: Orlan Leavens,  June 09, 2009 11:30 AM  Follow-up for Phone Call        completed - will need $20 form charge for pick up if not already collected -thanks Follow-up by: Newt Lukes MD,  June 09, 2009 4:53 PM  Additional Follow-up for Phone Call Additional follow up Details #1::        Notified pt form is readdy for pick-up, but there will be a $20 fee. She states ok will pick-up today. Giving form to [facility] who take care of paperwork/charges for forms Additional Follow-up by: Orlan Leavens,  June 10, 2009 8:56 AM    Additional Follow-up for Phone Call Additional follow up Details #2::    Called pt again to see when she will be able to pick-form up.Pt states will pick-up Monday 06/20/09. will get paperwork @ my desk. willneed to pay $20 fee before she can pick form up Follow-up by: Orlan Leavens,  June 16, 2009 3:15 PM

## 2010-03-28 NOTE — Assessment & Plan Note (Signed)
Summary: EAR IS CLOGGED UP/NWS   Vital Signs:  Patient profile:   35 year old female Height:      64 inches (162.56 cm) Weight:      247 pounds (112.27 kg) O2 Sat:      96 % on Room air Temp:     97.2 degrees F (36.22 degrees C) oral Pulse rate:   79 / minute BP sitting:   132 / 90  (left arm) Cuff size:   large  Vitals Entered By: Orlan Leavens RMA (November 02, 2009 4:01 PM)  O2 Flow:  Room air CC: (L) ear clogged Is Patient Diabetic? Yes Did you bring your meter with you today? No Pain Assessment Patient in pain? no        Primary Care Benn Tarver:  Newt Lukes MD  CC:  (L) ear clogged.  History of Present Illness: c/o left ear feeling clogged - onset 4 days ago - no ear pain, no drainage - no fever or tooth pain- denies placing foreign body into ears  also reviewed prior OV: 1) DM2 - new dx 3/2011at [facility] - has been to a nutrition class, planning more. checks sugars 1-2x/day - range 110-210 - reports compliance with ongoing medical treatment and no changes in medication dose or frequency. denies adverse side effects related to current therapy.   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 Take 1 Tablet Every 8 Hours As Needed 2)  Omeprazole 20 Mg Tbec (Omeprazole) .... Take 1 Tablet Two Times A Day 3)  Medroxyprogesterone Acetate 150 Mg/ml Susp (Medroxyprogesterone Acetate) .Marland Kitchen.. 1 Im Q 3 Mos 4)   Metformin Hcl 500 Mg Tabs (Metformin Hcl) .Marland Kitchen.. 1 By Mouth Two Times A Day 5)  Onetouch Ultra Test  Strp (Glucose Blood) .... Check Blood Sugar Two Times A Day  Dx: 250.00 6)  Onetouch Lancets  Misc (Lancets) .... Use Two Times A Day  Dx: 250.00 7)  Viactiv Multi-Vitamin  Chew (Multiple Vitamins-Calcium) .... Take 1 Three Times A Day 8)  Tylenol Sinus Max St 30-500 Mg Tabs (Pseudoephedrine-Apap) .Marland Kitchen.. 1 or 2 By Mouth Three Times A Day As Needed For Sinus and Headache Symptoms  Allergies (verified): No Known Drug Allergies  Past History:  Past Medical History: Anemia  Depression/anxiety with panic attacks Eczema GERD morbid obesity diabetes mellitus type 2    MD roster: gyn - women's health ([facility])  Review of Systems       The patient complains of decreased hearing.  The patient denies fever, vision loss, hoarseness, chest pain, syncope, headaches, and hemoptysis.    Physical Exam  General:  overweight-appearing.  alert, well-developed, well-nourished, and cooperative to examination. nontoxic Eyes:  vision grossly intact; pupils equal, round and reactive to light.  conjunctiva and lids normal.    Ears:  before irrigation, occluded L TM with soft cerumen - after irrigation, normal pinnae bilaterally, without erythema,  swelling, or tenderness to palpation. TMs clear, without effusion, or cerumen impaction. Hearing grossly normal bilaterally  Mouth:  teeth and gums in good repair; mucous membranes moist, without lesions or ulcers. oropharynx clear without exudate, no erythema.  Lungs:  normal respiratory effort, no intercostal retractions or use of accessory muscles; normal breath sounds bilaterally - no crackles and no wheezes.    Heart:  normal rate, regular rhythm, no murmur, and no rub. BLE without edema.  Additional Exam:  Procedure: ear irrigation Reason: wax impaction Risk/Benefit were discussed with patient who agrees to proceed. ear was irrigated with warm water. Large amount  of wax was removed. Instrumentation with metal ear loop was performed to accomplish the wax removal. Patient tolerated procedure well. Complications: none    Impression & Recommendations:  Problem # 1:  CERUMEN IMPACTION, LEFT (ICD-380.4)  assoc with hearing decrease, improved after irrigation  Orders: Cerumen Impaction Removal ([address])  Problem # 2:  DIABETES MELLITUS, TYPE II (ICD-250.00)  Her updated medication list for this problem includes:    Metformin Hcl 500 Mg Tabs (Metformin hcl) .Marland Kitchen... 1 by mouth two times a day  new dx 04/2009 cont nutrition followup -  need for weight loss rviewed and encouraged cont metformin   Labs Reviewed: Creat: 0.8 (05/20/2009)    Reviewed HgBA1c results: 7.2 (08/18/2009)  7.3 (05/20/2009)  Problem # 3:  OBESITY, UNSPECIFIED (ICD-278.00)  education and counseling on diet and exercise Ht: 64 (05/19/2009)   Wt: 247.0 (05/19/2009)   BMI: 42.55 (05/19/2009)  Ht: 64 (08/18/2009)   Wt: 247.4 (08/18/2009)   Ht: 64 (11/02/2009)   Wt: 247 (11/02/2009)   BMI: 42.55 (05/19/2009)  Problem # 4:  ANXIETY (ICD-300.00)  Her updated medication list for this problem includes:    Ativan 0.5 Mg Tabs (Lorazepam) .Marland Kitchen... Take 1 tablet every 8 hours as needed  on zolft prev but now controls with meditation and as needed ativan -  letter previously provided 04/2009 as per pt request for time at work as needed for panic attack tx  Complete Medication List: 1)  Ativan 0.5 Mg Tabs (Lorazepam) .... Take 1 tablet every 8 hours as needed 2)  Omeprazole 20 Mg Tbec (Omeprazole) .... Take 1 tablet two times a day 3)  Medroxyprogesterone Acetate 150 Mg/ml Susp (Medroxyprogesterone acetate) .Marland Kitchen.. 1 im q 3 mos 4)  Metformin Hcl 500 Mg Tabs (Metformin hcl) .Marland Kitchen.. 1 by mouth two times a day 5)  Onetouch Ultra Test Strp (Glucose blood) .... Check blood sugar two times a day  dx: 250.00 6)  Onetouch Lancets Misc (Lancets) .... Use two times a day  dx: 250.00 7)  Viactiv  Multi-vitamin Chew (Multiple vitamins-calcium) .... Take 1 three times a day 8)  Tylenol Sinus Max St 30-500 Mg Tabs (Pseudoephedrine-apap) .Marland Kitchen.. 1 or 2 by mouth three times a day as needed for sinus and headache symptoms  Patient Instructions: 1)  ear irrigated and cleaned today - 2)  use over the counter drops for ear wax to prevent recurrence 3)  Please keep scheduled follow-up appointment as planned, call sooner if problems.

## 2010-03-28 NOTE — Progress Notes (Signed)
Summary: blood sugar  Phone Note Call from Patient   Caller: Patient/ [phone] Reason for Call: Talk to Nurse Summary of Call: Pt left msg on triage req call back from nurse Valentina Gu). I called pt back wanting to know exact numbers for A1C and her cholestrol levels. also req rx for BS meter. Let pt know she can pick up meter @ office will send test strips & lancets to her pharmacy. Initial call taken by: Orlan Leavens,  May 25, 2009 3:59 PM    New/Updated Medications: ONETOUCH ULTRA TEST  STRP (GLUCOSE BLOOD) check blood sugar two times a day  Dx: 250.00 ONETOUCH LANCETS  MISC (LANCETS) use two times a day  Dx: 250.00 Prescriptions: ONETOUCH LANCETS  MISC (LANCETS) use two times a day  Dx: 250.00  #60 x 5   Entered by:   Orlan Leavens   Authorized by:   Newt Lukes MD   Signed by:   Orlan Leavens on 05/25/2009   Method used:   Electronically to        [facility] (retail)       [address]       [place], [place]  [address]       Ph: [phone]       Fax: [phone]   RxID:   [id number] ONETOUCH ULTRA TEST  STRP (GLUCOSE BLOOD) check blood sugar two times a day  Dx: 250.00  #60 x 5   Entered by:   Orlan Leavens   Authorized by:   Newt Lukes MD   Signed by:   Orlan Leavens on 05/25/2009   Method used:   Electronically to        [facility] (retail)       [address]       [place], [place]  [address]       Ph: [phone]       Fax: [phone]   RxID:   [id number]

## 2010-03-28 NOTE — Progress Notes (Signed)
Summary: Muscle Relaxer  Phone Note Call from Patient Call back at [facility] Phone [phone] Call back at [facility] ON HOME #   Summary of Call: Patient is requesting a call back.  Initial call taken by: Lamar Sprinkles, CMA,  December 15, 2009 3:22 PM  Follow-up for Phone Call        left mess to call office back. ...................Marland KitchenLamar Sprinkles, CMA  December 15, 2009 5:26 PM   Pt c/o low back pain and she says MD offered to send in rx for muscle relaxers to the pharmacy. She would like rx for same to go to [facility].  Follow-up by: Lamar Sprinkles, CMA,  December 16, 2009 10:07 AM  Additional Follow-up for Phone Call Additional follow up Details #1::        robaxin - erx done Additional Follow-up by: Newt Lukes MD,  December 16, 2009 10:08 AM    Additional Follow-up for Phone Call Additional follow up Details #2::    left detailed mess informing pt of above. Follow-up by: Lanier Prude, [facility]),  December 16, 2009 10:58 AM  New/Updated Medications: METHOCARBAMOL 750 MG TABS (METHOCARBAMOL) 1 by mouth three times a day as needed for muscle spasm pain Prescriptions: METHOCARBAMOL 750 MG TABS (METHOCARBAMOL) 1 by mouth three times a day as needed for muscle spasm pain  #40 x 1   Entered and Authorized by:   Newt Lukes MD   Signed by:   Newt Lukes MD on 12/16/2009   Method used:   Electronically to        [facility] (retail)       [address]       [place], [place]  [address]       Ph: [phone]       Fax: [phone]   RxID:   [id number]

## 2010-03-28 NOTE — Letter (Signed)
Summary: [facility]  [facility]   Imported By: Sherian Rein 07/04/2009 09:45:03  _____________________________________________________________________  External Attachment:    Type:   Image     Comment:   External Document

## 2010-03-28 NOTE — Letter (Signed)
Summary: Accommodation request/never picked up  Accommodation request/never picked up   Imported By: Lester Twain 08/19/2009 09:09:16  _____________________________________________________________________  External Attachment:    Type:   Image     Comment:   External Document

## 2010-03-28 NOTE — Progress Notes (Signed)
Summary: PA Omeprazole  Phone Note From Pharmacy   Summary of Call: PA Omeprazole called [facility] [phone] case [id number], form sent to Dr Ruby Cola  January 17, 2010 3:11 PM PA Faxed to [facility] @ [phone], awaiting approval. Dagoberto Reef  January 30, 2010 10:12 AM  PA Approved 01/09/10-07/29/10, case # [id number], pt aware  Initial call taken by: Dagoberto Reef,  February 02, 2010 10:22 AM

## 2010-03-30 NOTE — Medication Information (Signed)
Summary: Prior Auth and Approval/[facility]  Prior Auth and Approval/[facility]   Imported By: Lester [place] 02/07/2010 09:39:17  _____________________________________________________________________  External Attachment:    Type:   Image     Comment:   External Document

## 2010-03-30 NOTE — Assessment & Plan Note (Signed)
Summary: 3-4 MTH FU--STC   Vital Signs:  Patient profile:   35 year old female Height:      64 inches (162.56 cm) Weight:      242.0 pounds (110.00 kg) BMI:     41.69 O2 Sat:      99 % on Room air Temp:     98.2 degrees F (36.78 degrees C) oral Pulse rate:   80 / minute BP sitting:   132 / 88  (left arm) Cuff size:   large  Vitals Entered By: Orlan Leavens RMA (March 14, 2010 8:20 AM)  O2 Flow:  Room air CC: 3 month follow-up Is Patient Diabetic? Yes Did you bring your meter with you today? Yes Pain Assessment Patient in pain? no      Comments Req refill on ativan   Primary Care Provider:  Newt Lukes MD  CC:  3 month follow-up.  History of Present Illness: here for f/u  1) DM2 - new dx 3/2011at [facility] - has been to a nutrition class, planning more. checks sugars 1-2x/day - range 110-210 - inc metformin dose 07/2009 and added onglyza 02/2009 due to elev cbgs - reports compliance with ongoing medical treatment and no changes in medication dose or frequency. denies adverse side effects related to current therapy. no signs or symptoms hypogylcemia  2) anxiety/ panic attacks - continues to use medical "time out" at work as needed for tx of same "meditation time calms me down"; 1-2 episodes per mo - no escalation of symptoms or attacks, even with recent death of family member (uncle, dad)  3)GERD - well controlled, no reflux flares or abd pain -  4) obesity - working at gym with trainer, but only sporadic due to time restraints - water aerobics, weights; diet still subobtimal - goal 215# by end of 07/2010  Clinical Review Panels:  Lipid Management   Cholesterol:  171 (05/20/2009)   LDL (bad choesterol):  112 (05/20/2009)   HDL (good cholesterol):  34.20 (05/20/2009)  Diabetes Management   HgBA1C:  7.7 (12/15/2009)   Creatinine:  0.8 (05/20/2009)   Last Foot Exam:  yes (03/14/2010)  CBC   WBC:  8.7 (05/20/2009)   RBC:  4.77 (05/20/2009)   Hgb:  14.3 (05/20/2009)   Hct:  44.0 (05/20/2009)   Platelets:  274.0 (05/20/2009)   MCV  92.2 (05/20/2009)   MCHC  32.5 (05/20/2009)   RDW  12.8 (05/20/2009)   PMN:  63.5 (05/20/2009)   Lymphs:  30.3 (05/20/2009)   Monos:  4.7 (05/20/2009)   Eosinophils:  0.6 (05/20/2009)   Basophil:  0.9 (05/20/2009)  Complete Metabolic Panel   Glucose:  126 (05/20/2009)   Sodium:  142 (05/20/2009)   Potassium:  4.0 (05/20/2009)   Chloride:  106 (05/20/2009)   CO2:  28 (05/20/2009)   BUN:  5 (05/20/2009)   Creatinine:  0.8 (05/20/2009)   Albumin:  3.8 (05/20/2009)   Total Protein:  7.5 (05/20/2009)   Calcium:  9.4 (05/20/2009)   Total Bili:  0.3 (05/20/2009)   Alk Phos:  54 (05/20/2009)   SGPT (ALT):  23 (05/20/2009)   SGOT (AST):  16 (05/20/2009)   Current Medications (verified): 1)  Ativan 0.5 Mg Tabs (Lorazepam) .... Take 1 Tablet Every 8 Hours As Needed 2)  Omeprazole 20 Mg Tbec (Omeprazole) .... Take 1 Tablet Two Times A Day 3)  Medroxyprogesterone Acetate 150 Mg/ml Susp (Medroxyprogesterone Acetate) .Marland Kitchen.. 1 Im Q 3 Mos 4)  Metformin Hcl 1000 Mg Tabs (Metformin Hcl) .[facility] Kitchen[facility]  1 By Mouth Two Times A Day 5)  Onetouch Ultra Test  Strp (Glucose Blood) .... Check Blood Sugar Two Times A Day  Dx: 250.00 6)  Onetouch Lancets  Misc (Lancets) .... Use Two Times A Day  Dx: 250.00 7)  Viactiv Multi-Vitamin  Chew (Multiple Vitamins-Calcium) .... Take 1 Three Times A Day 8)  Tylenol Sinus Max St 30-500 Mg Tabs (Pseudoephedrine-Apap) .Marland Kitchen.. 1 or 2 By Mouth Three Times A Day As Needed For Sinus and Headache Symptoms 9)  Methocarbamol 750 Mg Tabs (Methocarbamol) .Marland Kitchen.. 1 By Mouth Three Times A Day As Needed For Muscle Spasm Pain 10)  Onglyza 2.5 Mg Tabs (Saxagliptin Hcl) .Marland Kitchen.. 1 By Mouth Once Daily  Allergies (verified): No Known Drug Allergies  Past History:  Past Medical History: Anemia  Depression/anxiety with panic attacks Eczema GERD morbid obesity diabetes mellitus type 2    MD roster:    gyn - [facility]  ([facility])  Family History: Family History of CAD Female 1st degree relative <50- father had CABG and angioplasty around age 12 Family History Diabetes 1st degree relative- father - age 72 Pulmonary fibrosis - sister at age 26 Sarcoidosis - mother age 36 Family History of Asthma- mother and sister  dad expired age 55s due to CAD/MI    Social History: Has 56 yr old son Dorma Russell who lives with her.  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    Physical Exam  General:  overweight-appearing.  alert, well-developed, well-nourished, and cooperative to examination. Lungs:  normal respiratory effort, no intercostal retractions or use of accessory muscles; normal breath sounds bilaterally - no crackles and no wheezes.    Heart:  normal rate, regular rhythm, no murmur, and no rub. BLE without edema.  Psych:  Oriented X3, memory intact for recent and remote, normally interactive, good eye contact, not anxious appearing, not depressed appearing, and not agitated.     Diabetes Management Exam:    Foot Exam (with socks and/or shoes not present):       Sensory-Pinprick/Light touch:          Left medial foot (L-4): normal          Left dorsal foot (L-5): normal          Left lateral foot (S-1): normal          Right medial foot (L-4): normal          Right dorsal foot (L-5): normal          Right lateral foot (S-1): normal       Sensory-Monofilament:          Left foot: normal          Right foot: normal       Inspection:          Left foot: normal          Right foot: normal       Nails:          Left foot: normal          Right foot: normal   Impression & Recommendations:  Problem # 1:  DIABETES MELLITUS, TYPE II (ICD-250.00)  Her updated medication list for this problem includes:    Metformin Hcl 1000 Mg Tabs (Metformin hcl) .Marland Kitchen... 1 by mouth two times a day     Onglyza  2.5 Mg Tabs (Saxagliptin hcl) .Marland Kitchen... 1 by mouth once daily    Lisinopril 10 Mg Tabs (Lisinopril) .Marland Kitchen... 1 by mouth once daily  Orders: TLB-A1C / Hgb A1C (Glycohemoglobin) (83036-A1C) Prescription Created Electronically ([id number])  new dx 04/2009 cont nutrition followup -  need for weight loss rviewed and encouraged cont metformin , added onglyza 02/2009 and doing well -  cont same also add ACEI for kidney and mild elev bp - erx done  Labs Reviewed: Creat: 0.8 (05/20/2009)    Reviewed HgBA1c results: 7.7 (12/15/2009)  7.2 (08/18/2009)  Problem # 2:  OBESITY, UNSPECIFIED (ICD-278.00)  education and counseling on diet and exercise Ht: 64 (05/19/2009)   Wt: 247.0 (05/19/2009)   BMI: 42.55 (05/19/2009)  Ht: 64 (08/18/2009)   Wt: 247.4 (08/18/2009)   Ht: 64 (11/02/2009)   Wt: 247 (11/02/2009)   BMI: 42.55 (05/19/2009)  Ht: 64 (12/15/2009)   Wt: 246.8 (12/15/2009)   BMI: 42.55 (05/19/2009)  Ht: 64 (03/14/2010)   Wt: 242.0 (03/14/2010)   BMI: 41.69 (03/14/2010)  Problem # 3:  ANXIETY (ICD-300.00)  Her updated medication list for this problem includes:    Ativan 0.5 Mg Tabs (Lorazepam) .Marland Kitchen... Take 1 tablet every 8 hours as needed  on zolft prev but now controls with meditation and as needed ativan -  letter previously provided 04/2009 as per pt request for time at work as needed for panic attack tx  Problem # 4:  GERD (ICD-530.81)  Her updated medication list for this problem includes:    Omeprazole 20 Mg Tbec (Omeprazole) .Marland Kitchen... Take 1 tablet two times a day  Labs Reviewed: Hgb: 14.3 (05/20/2009)   Hct: 44.0 (05/20/2009)  Complete Medication List: 1)  Ativan 0.5 Mg Tabs (Lorazepam) .... Take 1 tablet every 8 hours as needed 2)  Omeprazole 20 Mg Tbec (Omeprazole) .... Take 1 tablet two times a day 3)  Medroxyprogesterone Acetate 150 Mg/ml Susp (Medroxyprogesterone acetate) .Marland Kitchen.. 1 im q 3 mos 4)  Metformin Hcl 1000 Mg Tabs (Metformin hcl) .Marland Kitchen.. 1 by mouth two times a day 5)   Onetouch Ultra Test Strp (Glucose blood) .... Check blood sugar two times a day  dx: 250.00 6)  Onetouch Lancets Misc (Lancets) .... Use two times a day  dx: 250.00 7)  Viactiv Multi-vitamin Chew (Multiple vitamins-calcium) .... Take 1 three times a day 8)  Tylenol Sinus Max St 30-500 Mg Tabs (Pseudoephedrine-apap) .Marland Kitchen.. 1 or 2 by mouth three times a day as needed for sinus and headache symptoms 9)  Methocarbamol 750 Mg Tabs (Methocarbamol) .Marland Kitchen.. 1 by mouth three times a day as needed for muscle spasm pain 10)  Onglyza 2.5 Mg Tabs (Saxagliptin hcl) .Marland Kitchen.. 1 by mouth once daily 11)  Lisinopril 10 Mg Tabs (Lisinopril) .Marland Kitchen.. 1 by mouth once daily  Patient Instructions: 1)  it was good to see you today. 2)  test(s) ordered today - your results will be posted on the phone tree for review in 48-72 hours from the time of test completion; call [phone] and enter your 9 digit MRN (listed above on this page, just below your name); if any changes need to be made or there are abnormal results, you will be contacted directly. 3)  medications reviewed, continue same dose onglyza and add lisinopril for diabetic kidney and blood pressure. 4)  Please schedule a follow-up appointment in 3-4 months to monitor diabetes and weight, call sooner if problems.  5)  will call for your employment paperwork pickup when complete Prescriptions: ATIVAN 0.5 MG  TABS (LORAZEPAM) take 1 tablet every 8 hours as needed  #30 x 2   Entered and Authorized by:   Newt Lukes MD   Signed by:   Newt Lukes MD on 03/14/2010   Method used:   Printed then faxed to ...       [facility]       [address]       [place], [place]  [address]       Ph: [phone]       Fax: [phone]   RxID:   [id number] LISINOPRIL 10 MG TABS (LISINOPRIL) 1 by mouth once daily  #30 x 3   Entered and Authorized by:   Newt Lukes MD   Signed by:   Newt Lukes MD on 03/14/2010    Method used:   Printed then faxed to ...       [facility]       [address]       [place], [place]  [address]       Ph: [phone]       Fax: [phone]   RxID:   [id number]    Orders Added: 1)  Est. Patient Level IV [88416] 2)  TLB-A1C / Hgb A1C (Glycohemoglobin) [83036-A1C] 3)  Prescription Created Electronically [phone]

## 2010-03-30 NOTE — Progress Notes (Signed)
Summary: Elevated BP and CBG  Phone Note Call from Patient Call back at [facility] Phone [phone]   Caller: Patient Summary of Call: Pt called to report BP of 135/94 and CBG of 193. Pt states she is dizzy, lightheaded and mildly fatigued. Pt says her CBGs have been elevated for the last few days at an average of 150. Pt is concerned about elevation and is requesting med adjustment. Initial call taken by: Margaret Pyle, CMA,  March 01, 2010 11:38 AM  Follow-up for Phone Call        cont metformin 1000 two times a day and start onglyza one once daily  - erx done for same - keep ROV as planned 03/14/09 to review BP and cbg in further detail Follow-up by: Newt Lukes MD,  March 01, 2010 12:14 PM  Additional Follow-up for Phone Call Additional follow up Details #1::        Pt advised and aware coupon are in cabinet for pt pick up Additional Follow-up by: Margaret Pyle, CMA,  March 01, 2010 2:33 PM    New/Updated Medications: ONGLYZA 2.5 MG TABS (SAXAGLIPTIN HCL) 1 by mouth once daily Prescriptions: ONGLYZA 2.5 MG TABS (SAXAGLIPTIN HCL) 1 by mouth once daily  #30 x 3   Entered and Authorized by:   Newt Lukes MD   Signed by:   Newt Lukes MD on 03/01/2010   Method used:   Electronically to        [facility]retail)       [address]       [place], [place]  [address]       Ph: [phone]       Fax: [phone]   RxID:   [id number]

## 2010-04-25 ENCOUNTER — Telehealth: Payer: Self-pay | Admitting: Internal Medicine

## 2010-05-04 NOTE — Progress Notes (Signed)
Summary: omeprazole  Phone Note Refill Request Message from:  Fax from Pharmacy on April 25, 2010 11:34 AM  Refills Requested: Medication #1:  OMEPRAZOLE 20 MG TBEC take 1 tablet two times a day Lunette Tapp [phone]   Method Requested: Electronic Initial call taken by: Orlan Leavens RMA,  April 25, 2010 11:34 AM    Prescriptions: OMEPRAZOLE 20 MG TBEC (OMEPRAZOLE) take 1 tablet two times a day  #60 x 5   Entered by:   Orlan Leavens RMA   Authorized by:   Newt Lukes MD   Signed by:   Orlan Leavens RMA on 04/25/2010   Method used:   Electronically to        [facility]* (retail)       [address]       [place], [place]  [address]       Ph: [phone]       Fax: [phone]   RxID:   [id number]

## 2010-05-12 ENCOUNTER — Ambulatory Visit: Payer: BC Managed Care – PPO

## 2010-05-12 DIAGNOSIS — Z3049 Encounter for surveillance of other contraceptives: Secondary | ICD-10-CM

## 2010-05-15 LAB — DIFFERENTIAL
Basophils Absolute: 0 10*3/uL (ref 0.0–0.1)
Eosinophils Relative: 1 % (ref 0–5)
Lymphs Abs: 3.2 10*3/uL (ref 0.7–4.0)
Monocytes Absolute: 0.5 10*3/uL (ref 0.1–1.0)
Neutro Abs: 6.2 10*3/uL (ref 1.7–7.7)

## 2010-05-15 LAB — URINALYSIS, ROUTINE W REFLEX MICROSCOPIC
Bilirubin Urine: NEGATIVE
Urobilinogen, UA: 1 mg/dL (ref 0.0–1.0)
pH: 6 (ref 5.0–8.0)

## 2010-05-15 LAB — URINE MICROSCOPIC-ADD ON

## 2010-05-15 LAB — POCT CARDIAC MARKERS
CKMB, poc: <1 ng/mL — ABNORMAL LOW (ref 1.0–8.0)
Myoglobin, poc: 37.2 ng/mL (ref 12–200)
Troponin i, poc: <0.05 ng/mL (ref 0.00–0.09)

## 2010-05-15 LAB — BASIC METABOLIC PANEL
BUN: 7 mg/dL (ref 6–23)
CO2: 24 mEq/L (ref 19–32)
Calcium: 9.5 mg/dL (ref 8.4–10.5)
Chloride: 108 mEq/L (ref 96–112)
Creatinine, Ser: 0.76 mg/dL (ref 0.4–1.2)

## 2010-05-15 LAB — CBC
HCT: 40.8 % (ref 36.0–46.0)
Platelets: 264 10*3/uL (ref 150–400)
RDW: 13.4 % (ref 11.5–15.5)

## 2010-05-15 LAB — RAPID URINE DRUG SCREEN, HOSP PERFORMED
Benzodiazepines: NOT DETECTED
Cocaine: NOT DETECTED
Opiates: NOT DETECTED

## 2010-05-17 ENCOUNTER — Encounter: Payer: Self-pay | Admitting: *Deleted

## 2010-06-12 ENCOUNTER — Telehealth: Payer: Self-pay

## 2010-06-12 NOTE — Telephone Encounter (Signed)
Patient called lmovm c/o low back pain that radiates down Left side pelvis and leg. She is requesting a call back. I returned call to patient

## 2010-06-12 NOTE — Telephone Encounter (Signed)
Spoke with patient who is not able to come in today. I advised can seek urgent is next avail appt not good.

## 2010-06-13 LAB — DIFFERENTIAL
Basophils Absolute: 0.1 10*3/uL (ref 0.0–0.1)
Eosinophils Relative: 0 % (ref 0–5)
Lymphocytes Relative: 34 % (ref 12–46)
Lymphs Abs: 3.5 10*3/uL (ref 0.7–4.0)
Monocytes Absolute: 0.6 10*3/uL (ref 0.1–1.0)
Neutro Abs: 6.2 10*3/uL (ref 1.7–7.7)

## 2010-06-13 LAB — URINE CULTURE: Colony Count: >=100000

## 2010-06-13 LAB — POCT PREGNANCY, URINE: Preg Test, Ur: NEGATIVE

## 2010-06-13 LAB — CBC
HCT: 42.8 % (ref 36.0–46.0)
MCHC: 33.9 g/dL (ref 30.0–36.0)
MCV: 91.5 fL (ref 78.0–100.0)
Platelets: 287 10*3/uL (ref 150–400)
RDW: 13.5 % (ref 11.5–15.5)
WBC: 10.4 10*3/uL (ref 4.0–10.5)

## 2010-06-13 LAB — POCT I-STAT, CHEM 8
BUN: 5 mg/dL — ABNORMAL LOW (ref 6–23)
Creatinine, Ser: 0.8 mg/dL (ref 0.4–1.2)
Hemoglobin: 16 g/dL — ABNORMAL HIGH (ref 12.0–15.0)
Potassium: 3.8 mEq/L (ref 3.5–5.1)
Sodium: 142 mEq/L (ref 135–145)
TCO2: 25 mmol/L (ref 0–100)

## 2010-06-13 LAB — COMPREHENSIVE METABOLIC PANEL
AST: 19 U/L (ref 0–37)
Albumin: 3.7 g/dL (ref 3.5–5.2)
BUN: 5 mg/dL — ABNORMAL LOW (ref 6–23)
Calcium: 9.4 mg/dL (ref 8.4–10.5)
Creatinine, Ser: 0.77 mg/dL (ref 0.4–1.2)
GFR calc Af Amer: >60 mL/min (ref >60)
Total Protein: 7.1 g/dL (ref 6.0–8.3)

## 2010-06-13 LAB — URINALYSIS, ROUTINE W REFLEX MICROSCOPIC
Bilirubin Urine: NEGATIVE
Glucose, UA: 100 mg/dL — AB
Nitrite: NEGATIVE
Protein, ur: NEGATIVE mg/dL
Specific Gravity, Urine: 1.022 (ref 1.005–1.030)
pH: 6 (ref 5.0–8.0)

## 2010-06-13 LAB — POCT CARDIAC MARKERS
CKMB, poc: <1 ng/mL — ABNORMAL LOW (ref 1.0–8.0)
Myoglobin, poc: 33.2 ng/mL (ref 12–200)
Myoglobin, poc: 37.8 ng/mL (ref 12–200)

## 2010-06-13 LAB — APTT: aPTT: 26 seconds (ref 24–37)

## 2010-06-13 LAB — URINE MICROSCOPIC-ADD ON

## 2010-06-20 ENCOUNTER — Telehealth: Payer: Self-pay | Admitting: Internal Medicine

## 2010-06-20 ENCOUNTER — Encounter: Payer: Self-pay | Admitting: Internal Medicine

## 2010-06-20 ENCOUNTER — Other Ambulatory Visit (INDEPENDENT_AMBULATORY_CARE_PROVIDER_SITE_OTHER): Payer: BC Managed Care – PPO

## 2010-06-20 ENCOUNTER — Ambulatory Visit (INDEPENDENT_AMBULATORY_CARE_PROVIDER_SITE_OTHER): Payer: BC Managed Care – PPO | Admitting: Internal Medicine

## 2010-06-20 DIAGNOSIS — F411 Generalized anxiety disorder: Secondary | ICD-10-CM

## 2010-06-20 DIAGNOSIS — E119 Type 2 diabetes mellitus without complications: Secondary | ICD-10-CM

## 2010-06-20 LAB — HEMOGLOBIN A1C: Hgb A1c MFr Bld: 7.4 % — ABNORMAL HIGH (ref 4.6–6.5)

## 2010-06-20 MED ORDER — SAXAGLIPTIN HCL 2.5 MG PO TABS: 2.5000 mg | ORAL_TABLET | Freq: Every day | ORAL | Status: DC

## 2010-06-20 MED ORDER — LORAZEPAM 0.5 MG PO TABS: 0.5000 mg | ORAL_TABLET | Freq: Three times a day (TID) | ORAL | Status: DC | PRN

## 2010-06-20 MED ORDER — OMEPRAZOLE 20 MG PO CPDR: 20.0000 mg | DELAYED_RELEASE_CAPSULE | Freq: Every day | ORAL | Status: DC

## 2010-06-20 MED ORDER — LISINOPRIL 10 MG PO TABS: 10.0000 mg | ORAL_TABLET | Freq: Every day | ORAL | Status: DC

## 2010-06-20 MED ORDER — METFORMIN HCL 1000 MG PO TABS: 1000.0000 mg | ORAL_TABLET | Freq: Two times a day (BID) | ORAL | Status: DC

## 2010-06-20 MED ORDER — CITALOPRAM HYDROBROMIDE 10 MG PO TABS: 10.0000 mg | ORAL_TABLET | Freq: Every day | ORAL | Status: DC

## 2010-06-20 NOTE — Telephone Encounter (Signed)
Please call patient - unchanged a1c results (7.4). No medication changes recommended, continue improved compliance as we discussed at OV. Thanks.

## 2010-06-20 NOTE — Progress Notes (Signed)
  Subjective:    Patient ID: Emily [name], female    DOB: 05-Sep-1975, 35 y.o.   MRN: [id number]  HPI  here for follow up - reviewed chronic medcal issues:  DM2 - new dx 3/2011at [facility] - has been to a nutrition class. checks sugars 1-2x/day - range 110-210 - inc metformin dose 07/2009 and added onglyza 02/2009. reports variable compliance with ongoing medical treatment and no changes in medication dose or frequency. denies adverse side effects related to current therapy. no signs or symptoms hypogylcemia  anxiety/ panic attacks - continues to use medical "time out" at work as needed for tx of same "meditation time calms me down"; 1-2 episodes per mo - complains of escalation of symptoms or attacks, related to death of family members (uncle, dad) summer 2011 and job termination planned 06/2010  GERD - well controlled, no reflux flares or abd pain -  obesity - working at gym with trainer, but only sporadic due to time restraints - water aerobics, weights; diet still subobtimal - goal 215# by end of 07/2010  Past Medical History  Diagnosis Date  . DIABETES MELLITUS, TYPE II 05/20/2009  . Obesity, unspecified 05/19/2009  . ANXIETY 05/18/2008  . PANIC DISORDER 05/18/2008  . DEPRESSION 07/20/2008  . Acute frontal sinusitis 09/19/2009  . GERD 05/19/2009    Review of Systems  Constitutional: Negative for fever.  Respiratory: Negative for cough.   Cardiovascular: Negative for chest pain.       Objective:   Physical Exam  Constitutional: She appears well-developed and well-nourished. No distress.       Obese.  Cardiovascular: Normal rate, regular rhythm and normal heart sounds.   Pulmonary/Chest: Effort normal and breath sounds normal. She has no wheezes.  Psychiatric: She has a normal mood and affect. Her behavior is normal.  There were no vitals taken for this visit. Lab Results  Component Value Date   WBC 10.1 08/03/2009   HGB 13.9 08/03/2009   HCT 40.8 08/03/2009   PLT 264 08/03/2009   CHOL  171 05/20/2009   TRIG 123.0 05/20/2009   HDL 34.20* 05/20/2009   ALT 23 05/20/2009   AST 16 05/20/2009   NA 139 08/03/2009   K 3.8 08/03/2009   CL 108 08/03/2009   CREATININE 0.76 08/03/2009   BUN 7 08/03/2009   CO2 24 08/03/2009   TSH 1.35 05/20/2009   INR 0.9 04/07/2008   HGBA1C 7.4* 03/14/2010   MICROALBUR 1.1 08/18/2009   Wt Readings from Last 3 Encounters:  03/14/10 242 lb (109.77 kg)  12/15/09 246 lb 12.8 oz (111.948 kg)  11/02/09 247 lb (112.038 kg)        Assessment & Plan:  See problem list. Medications and labs reviewed today.

## 2010-06-20 NOTE — Assessment & Plan Note (Signed)
exac by stressors and work and family and health Intol of sertraline previously - try citalopram in addition to prn BZ - new erx done

## 2010-06-20 NOTE — Patient Instructions (Addendum)
It was good to see you today. Test(s) ordered today. Your results will be called to you after review (48-72hours after test completion). If any changes need to be made, you will be notified at that time. Medications reviewed - will start citalopram as discussed - Your prescription(s) have been submitted to your pharmacy. Please take as directed and contact our office if you believe you are having problem(s) with the medication(s). Refill on other medication(s) as discussed today. Please schedule followup in 3-4 months, call sooner if problems.

## 2010-06-20 NOTE — Telephone Encounter (Signed)
Pt Notified with lab results.[facility]4/24/12@4 :48pm/LMB

## 2010-06-20 NOTE — Assessment & Plan Note (Signed)
The current medical regimen is effective;  continue present plan and medications.  Reminded of need for exercise, diet and ongoing weight loss Check a1c now

## 2010-07-10 ENCOUNTER — Telehealth: Payer: Self-pay

## 2010-07-10 DIAGNOSIS — M545 Low back pain, unspecified: Secondary | ICD-10-CM

## 2010-07-10 NOTE — Telephone Encounter (Signed)
celexa SE should improved with time - stop if intolerable, otherwise, continue as rx'd - thanks

## 2010-07-10 NOTE — Telephone Encounter (Signed)
Pt advised of referral and will expect a call from [facility] with appt info.   Pt also states that Celexa is causing nausea and diarrhea. She started medication Friday. Pt says that is this a SE that will subside in time she is willing to continue

## 2010-07-10 NOTE — Telephone Encounter (Signed)
will order MRI as per pt preference

## 2010-07-10 NOTE — Telephone Encounter (Signed)
Pt called requesting referral to have MRI for persistent lower back pain. Pt states that she will no longer have Medical Insurance after 05/31 and would like MRI scheduled as soon as possible.

## 2010-07-11 ENCOUNTER — Emergency Department (HOSPITAL_COMMUNITY)
Admission: EM | Admit: 2010-07-11 | Discharge: 2010-07-11 | Disposition: A | Discharge status: Home / Self Care | Payer: BC Managed Care – PPO | Attending: Emergency Medicine | Admitting: Emergency Medicine

## 2010-07-11 DIAGNOSIS — I10 Essential (primary) hypertension: Secondary | ICD-10-CM | POA: Insufficient documentation

## 2010-07-11 DIAGNOSIS — R42 Dizziness and giddiness: Secondary | ICD-10-CM | POA: Insufficient documentation

## 2010-07-11 DIAGNOSIS — E119 Type 2 diabetes mellitus without complications: Secondary | ICD-10-CM | POA: Insufficient documentation

## 2010-07-11 DIAGNOSIS — L259 Unspecified contact dermatitis, unspecified cause: Secondary | ICD-10-CM | POA: Insufficient documentation

## 2010-07-11 DIAGNOSIS — R110 Nausea: Secondary | ICD-10-CM | POA: Insufficient documentation

## 2010-07-11 DIAGNOSIS — Z79899 Other long term (current) drug therapy: Secondary | ICD-10-CM | POA: Insufficient documentation

## 2010-07-11 DIAGNOSIS — M549 Dorsalgia, unspecified: Secondary | ICD-10-CM | POA: Insufficient documentation

## 2010-07-11 LAB — POCT CARDIAC MARKERS: Troponin i, poc: <0.05 ng/mL (ref 0.00–0.09)

## 2010-07-11 LAB — POTASSIUM: Potassium: 4 mEq/L (ref 3.5–5.1)

## 2010-07-11 LAB — URINALYSIS, ROUTINE W REFLEX MICROSCOPIC
Nitrite: NEGATIVE
Specific Gravity, Urine: 1.024 (ref 1.005–1.030)
Urobilinogen, UA: 0.2 mg/dL (ref 0.0–1.0)
pH: 5 (ref 5.0–8.0)

## 2010-07-11 LAB — DIFFERENTIAL
Eosinophils Absolute: 0 10*3/uL (ref 0.0–0.7)
Lymphocytes Relative: 31 % (ref 12–46)
Lymphs Abs: 3.5 10*3/uL (ref 0.7–4.0)
Neutro Abs: 7.4 10*3/uL (ref 1.7–7.7)
Neutrophils Relative %: 64 % (ref 43–77)

## 2010-07-11 LAB — CBC
Hemoglobin: 14.2 g/dL (ref 12.0–15.0)
MCH: 29.8 pg (ref 26.0–34.0)
Platelets: 323 10*3/uL (ref 150–400)
RBC: 4.77 MIL/uL (ref 3.87–5.11)
WBC: 11.5 10*3/uL — ABNORMAL HIGH (ref 4.0–10.5)

## 2010-07-11 LAB — URINE MICROSCOPIC-ADD ON

## 2010-07-11 LAB — POCT PREGNANCY, URINE: Preg Test, Ur: NEGATIVE

## 2010-07-11 NOTE — Telephone Encounter (Signed)
Pt was advised by ER MD to be seen by Neurologist for dizziness. Pt is requesting referral

## 2010-07-11 NOTE — Group Therapy Note (Signed)
Emily [name], Emily [name] NO.:  [id number]   MEDICAL RECORD NO.:  [id number]          PATIENT TYPE:  WOC   LOCATION:  [facility]                   FACILITY:  [facility]   PHYSICIAN:  Argentina Donovan, MD        DATE OF BIRTH:  [birth year]   DATE OF SERVICE:  10/11/2006                                  CLINIC NOTE   The patient is a 35 year old gravida 2, para 1-0-0-1 who has been on  Depo-Provera now for the last couple years and is in for her injection.  She has had no complaint of any bleeding, spotting, or any other health  problems with exception of her obesity.  She has no medical problems.   She does weigh 247 pounds and is 5-4 inches tall.  Blood pressure  135/83, temperature 97.8, and pulse of 87 per minute.  She is in for her breast examination and breasts are very large and  pendulous, no dominant masses palpated and no nipple discharge.  ABDOMEN:  Soft, flat, obese, nontender.  No masses or organomegaly  palpable.  GENITALIA:  Examination external genitalia is normal.  BUS is within  normal limits.  Vagina is clean and well rugated.  Cervix is clean and  parous.  The uterus and adnexa could not be well palpated cause of  habitus of the patient.  Pap smear was taken.  The last Pap smear 1 year  ago in May was completely normal.   IMPRESSION:  Normal gynecological examination.   PLAN:  Depo-Provera 150 and to return in 3 months for another injection.  She has been counseled to take calcium with vitamin D supplement and  multivitamins, in general it would be advantageous we have told her.           ______________________________  Argentina Donovan, MD     PR/MEDQ  D:  10/11/2006  T:  10/11/2006  Job:  [id number]

## 2010-07-11 NOTE — Group Therapy Note (Signed)
NAMEAINSLIE, MAZUREK NO.:  [id number]   MEDICAL RECORD NO.:  [id number]          PATIENT TYPE:  WOC   LOCATION:  [facility]                   FACILITY:  [facility]   PHYSICIAN:  Argentina Donovan, MD        DATE OF BIRTH:  March 01, 1975   DATE OF SERVICE:  10/30/2007                                  CLINIC NOTE   The patient is a 35 year old gravida 2, para 1-0-1-1 who has been on  Depo-Provera for sometime.  She is in for her shot and her annual  examination.  In annual examination, her breasts are pendulous and  large.  No dominant masses.  No nipple discharge.  Abdomen is soft,  nontender, and obese with no organomegaly noted.  External genitalia is  normal.  BUS within normal limits.  Vagina clean and well rugated.  Cervix is parous.  Uterus and adnexa could not be outlined because of  habitus of the patient.  Pap smear was taken.   Normal gynecological examination.           ______________________________  Argentina Donovan, MD     PR/MEDQ  D:  10/30/2007  T:  10/31/2007  Job:  [id number]

## 2010-07-11 NOTE — Telephone Encounter (Signed)
Pt advised and states that she is at the ER for lightheadedness and fatigue. Pt is unsure of sxs are due to medication but will call back once seen and update of ER MD findings.

## 2010-07-11 NOTE — Group Therapy Note (Signed)
[name], [name] NO.:  [id number]   MEDICAL RECORD NO.:  [id number]          PATIENT TYPE:  WOC   LOCATION:  [facility]                   FACILITY:  [facility]   PHYSICIAN:  Argentina Donovan, MD        DATE OF BIRTH:  [birth year]   DATE OF SERVICE:  12/26/2006                                  CLINIC NOTE   The patient is a 35 year old African American female who has been on  long-term Depo-Provera to control dysfunctional bleeding.  She came in  today for her routine injection and was complaining of what she thought  was a lump between her breasts.   EXAMINATION:  Her breasts are large, pendulous with no dominant masses.  But what she is complaining of seems to be a soreness that is a typical  costochondritis CC syndrome, on the right side around the fourth rib  where it joins the sternum.  Other than that, everything seems fine.  She will get her Depo-Provera as per usual.   DIAGNOSIS:  Costochondritis.           ______________________________  Argentina Donovan, MD     PR/MEDQ  D:  12/26/2006  T:  12/27/2006  Job:  [id number]

## 2010-07-11 NOTE — Telephone Encounter (Signed)
Pt needs to be seen here, not neurology, for ER f/u - will make referral at that time if still needed - thanks

## 2010-07-11 NOTE — Telephone Encounter (Signed)
Pt advised and transferred to schedule appt.  

## 2010-07-13 LAB — POCT I-STAT, CHEM 8
BUN: 8 mg/dL (ref 6–23)
Calcium, Ion: 1.06 mmol/L — ABNORMAL LOW (ref 1.12–1.32)
Creatinine, Ser: 0.8 mg/dL (ref 0.4–1.2)
Glucose, Bld: 137 mg/dL — ABNORMAL HIGH (ref 70–99)
Hemoglobin: 15.3 g/dL — ABNORMAL HIGH (ref 12.0–15.0)
TCO2: 24 mmol/L (ref 0–100)

## 2010-07-14 NOTE — Discharge Summary (Signed)
[facility]  Patient:    Emily [name], Emily [name]                    MRN: [id number] Adm. Date:  06/28/00 Disc. Date: 07/01/00 Attending:  Bing Neighbors. Clearance Coots, M.D. Dictator:   Marvis Moeller, C.N.M.                           Discharge Summary  ADMISSION DIAGNOSES:          1. A 33-week intrauterine pregnancy.                               2. Asymptomatic bacteriuria with persistent                                  Pseudomonas.  DISCHARGE DIAGNOSES:          1. A 33-3/7 week intrauterine pregnancy.                               2. Pseudomonas bacteriuria, treated.  HISTORY:                      This is a 35 year old, G2, P0-0-1-0, who was being followed at the [facility] Department. Her urine C&S at the [facility] Department grew out greater than 100,000 colonies or Pseudomonas aeruginosa. The patient still had a positive urinalysis and denied any symptoms of fever, chills, nausea, vomiting, back pain, dysuria, hematuria. Her prenatal care was uncomplicated other than having some mild tension-type headaches.  LABORATORY DATA:              Her labs were significant for three negative urine cultures done due to urinalysis being positive, and on June 24, 2000 her urine C&S did grow out Pseudomonas.  PAST MEDICAL HISTORY:         She did have cystitis twice but no history of kidney infection.  MEDICATIONS:                  Prenatal vitamins.  ALLERGIES:                    None.  PAST SURGICAL HISTORY:        None.  FAMILY HISTORY:               Noncontributory.  ADMISSION PHYSICAL EXAMINATION:                  The patient is a well-nourished, well-developed female in no distress. Lungs clear to auscultation. Heart regular rate and rhythm. Abdomen gravid consistent with 33-week size, nontender. Pelvic exam is omitted. Extremities without edema.  HOSPITAL COURSE:              The decision was made to admit the patient  for 72 hours worth of Fortax treatment. This was in consultation with Dr. Clearance Coots to Dr. Rockey Situ. Roxan Hockey in the infectious disease department. Her exam remained benign with no CVA tenderness and no fevers. On hospital day #3 she was reporting good fetal movement, no complaints, and reviewing her records again, she had no pregnancy complications. Her vital signs were stable. Doptone fetal heart tones were in the 150s. She had negative CVA tenderness. Urine  culture done on May 3 came back negative. She was clinically doing well so the plan was made in consultation with Dr. Clearance Coots to discharge her home and to follow up a urine culture in one week. She was to follow up in one week at [facility] and her discharge condition was good.  DISCHARGE MEDICATIONS:        Prenatal vitamin one a day. DD:  10/03/00 TD:  10/03/00 Job: [id number]

## 2010-07-14 NOTE — Group Therapy Note (Signed)
Emily [name], Emily [name]                       ACCOUNT NO.:  [id number]   MEDICAL RECORD NO.:  [id number]                   PATIENT TYPE:  OUT   LOCATION:  [facility]                           FACILITY:  [facility]   PHYSICIAN:  Tinnie Gens, MD                     DATE OF BIRTH:  03-Nov-1975   DATE OF SERVICE:  06/17/2003                                    CLINIC NOTE   CHIEF COMPLAINT:  Abnormal uterine bleeding.   HISTORY OF PRESENT ILLNESS:  The patient is a 35 year old gravida 2 para 1-0-  1-1 who is approximately 3 years post delivery.  She got Depo x2 post  delivery and then has had regular cycles from then on.  She reports that her  last cycle prior to this one was normal and then for the past month she has  been bleeding.  She was seen in the MAU for this on June 09, 2003 at which  time she had a normal ultrasound except for a prominent endometrium  measuring 16 mm and she had a normal hemoglobin at 13.1, a negative UPT, a  negative wet prep, negative GC and chlamydia testing.   PAST MEDICAL HISTORY:  Negative.   PAST SURGICAL HISTORY:  Negative.   MEDICATIONS:  None.   ALLERGIES:  None known.   GYNECOLOGICAL HISTORY:  Menarche at age 41, periods once a month, last 7  days.  She uses condoms for birth control.  No history of abnormal Pap.  No  history of STDs.   OBSTETRICAL HISTORY:  She is a G2 P1.  She had one SAB in 1999 and a vaginal  delivery at 40 weeks two-and-a-half years ago.   FAMILY HISTORY:  Significant for diabetes, coronary artery disease,  hypertension, blood clots, sarcoidosis, BOOP, and eczema.   SOCIAL HISTORY:  She does work outside the home.  She lives with her parents  and her son.  She denies tobacco, alcohol, or drug use.   REVIEW OF SYMPTOMS:  A 14-point review of systems is reviewed and is  positive for weight gain and fatigue as well as vaginal bleeding.  Otherwise, it is negative.   PHYSICAL EXAMINATION:  VITAL SIGNS:  Her pulse is 71, blood  pressure 111/74,  weight 243.8, height 5 feet 4 inches.  GENERAL:  She is an obese black female in no acute distress.  NECK:  Reveals normal thyroid without mass.  GENITOURINARY:  She has normal external female genitalia.  The vagina is  rugated.  The cervix is visualized, has numerous nabothian cysts present  both anteriorly and posteriorly with somewhat friable.  The uterus was small  and anteverted.  The adnexa were without mass or tenderness.   IMPRESSION:  1. Abnormal uterine bleeding, questionably related to weight gain; rule out     thyroid disease.  2. Gynecological examination.   PLAN:  1. Check TSH today.  2. Ovcon-35 one p.o. daily for  bleeding control.  3. Follow up in 4-6 weeks to see how her bleeding is doing.  4. Pap smear today.                                               Tinnie Gens, MD    TP/MEDQ  D:  06/17/2003  T:  06/17/2003  Job:  [id number]

## 2010-07-14 NOTE — Group Therapy Note (Signed)
NAMEBROOKLIN, RIEGER NO.:  [id number]   MEDICAL RECORD NO.:  [id number]          PATIENT TYPE:  WOC   LOCATION:  [facility]                   FACILITY:  [facility]   PHYSICIAN:  Tinnie Gens, MD        DATE OF BIRTH:  December 23, 1975   DATE OF SERVICE:                                    CLINIC NOTE   She is presenting for a Depo-Provera shot and is also due for a Pap smear.   This is a 35 year old, G2, P 1-0-1-1, who is happy with using Depo for the  past year.  She does report some slight spotting which on further  questioning seems to be post coital spotting.  It last occurred a few days  ago.  Her other concern is sweating in intertriginous areas particularly  below her breasts and somewhat in her groin.  She has been to a  dermatologist for this and was at one point treated for a fungus.  She does  not have a rash at present and is doing the measures that the dermatologist  recommended.  She has otherwise been amenorrheic for the past year while she  has been on Depo.  She denies having a history of STIs or any previous  episodes of post coital spotting in the past.  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  She does try to watch her diet, and she goes to [employer] for the purpose  of weight loss.  Of note, she has a strong family history of diabetes but  regularly gets tested at the health department.  This last was done a couple  of years ago.   REVIEW OF SYSTEMS:  Otherwise essentially negative.   PHYSICAL EXAMINATION:  VITAL SIGNS:  Temp 97.4, pulse 89, BP 115/73, weight  236.4, and she is 5 feet 4 inches.  GENERAL:  No acute distress.  NECK:  Thyroid normal size, shape, position.  ABDOMEN:  Protuberant, nontender.  No masses palpated.  PELVIC:  NEFG.  Vagina pink with normal rugae.  Cervix pink, multiparous,  one small  nabothian cyst but no bleeding with Pap smear.  Uterus difficult  to outline.  Adnexa without masses or tenderness.   ASSESSMENT:  1.  Obesity.  2.  Routine gynecologic care.  3.  Possible post coital bleeding.   PLAN:  1.  Pap, GC, and Chlamydia are sent.  2.  She is advised to return to the health department for her glucose      tolerance test on a periodic basis.  3.  Discussed measures for keeping intertriginous areas dry and treatment if      fungal rash appears.  4.  She is given Depo-Provera 150 mg IM today and will return in three      months for same.     ______________________________  Sharolyn Douglas, CNM    ______________________________  Tinnie Gens, MD    DP/MEDQ  D:  07/05/2005  T:  07/06/2005  Job:  [id number]

## 2010-07-16 ENCOUNTER — Ambulatory Visit
Admission: RE | Admit: 2010-07-16 | Discharge: 2010-07-16 | Disposition: A | Discharge status: Home / Self Care | Payer: BC Managed Care – PPO | Source: Ambulatory Visit | Attending: Internal Medicine | Admitting: Internal Medicine

## 2010-07-16 DIAGNOSIS — M545 Low back pain, unspecified: Secondary | ICD-10-CM

## 2010-07-17 ENCOUNTER — Encounter: Payer: Self-pay | Admitting: Internal Medicine

## 2010-07-17 ENCOUNTER — Ambulatory Visit (INDEPENDENT_AMBULATORY_CARE_PROVIDER_SITE_OTHER): Payer: BC Managed Care – PPO | Admitting: Internal Medicine

## 2010-07-17 ENCOUNTER — Encounter: Payer: Self-pay | Admitting: *Deleted

## 2010-07-17 ENCOUNTER — Telehealth: Payer: Self-pay | Admitting: Internal Medicine

## 2010-07-17 VITALS — BP 122/78 | HR 89 | Temp 97.6°F | Ht 64.0 in | Wt 234.4 lb

## 2010-07-17 DIAGNOSIS — IMO0002 Reserved for concepts with insufficient information to code with codable children: Secondary | ICD-10-CM

## 2010-07-17 DIAGNOSIS — M5416 Radiculopathy, lumbar region: Secondary | ICD-10-CM

## 2010-07-17 DIAGNOSIS — M546 Pain in thoracic spine: Secondary | ICD-10-CM

## 2010-07-17 MED ORDER — HYDROCODONE-ACETAMINOPHEN 5-500 MG PO TABS: 1.0000 | ORAL_TABLET | ORAL | Status: DC | PRN

## 2010-07-17 MED ORDER — METHOCARBAMOL 750 MG PO TABS: 750.0000 mg | ORAL_TABLET | Freq: Three times a day (TID) | ORAL | Status: DC | PRN

## 2010-07-17 NOTE — Telephone Encounter (Signed)
Please cal pt - MRI shows low disc protrusion on left side may be causing her sx - will refer to Nsurg for eval and tx as needed - thanks

## 2010-07-17 NOTE — Patient Instructions (Signed)
It was good to see you today. We have reviewed your prior records including labs and tests today - will hold on referral to neurology at this time. we'll make referral to Dr. Jordan Likes Neurosurgeon (or whoever can see you next). Our office will contact you regarding appointment(s) once made. Until that appointment, use Diclofenac, muscle relaxer and Vicodin as needed for severe pain symptoms - Your prescription(s) have been submitted to your pharmacy. Please take as directed and contact our office if you believe you are having problem(s) with the medication(s). Work note provided today Other medications reviewed, no other changes at this time.

## 2010-07-17 NOTE — Telephone Encounter (Signed)
Message copied by Rene Paci ANN on Mon Jul 17, 2010 12:58 PM ------      Message from: Margaret Pyle      Created: Mon Jul 17, 2010  8:24 AM                   ----- Message -----         From: Rad Results In Interface         Sent: 07/17/2010   8:13 AM           To: Margaret Pyle, CMA

## 2010-07-17 NOTE — Progress Notes (Signed)
  Subjective:    Patient ID: Emily [name], female    DOB: [birth year], 35 y.o.   MRN: [id number]  HPI  Here for ER follow up - seen 5/15 for dizziness - improved with meclizine but worse with pain  Continued pain in low back radiating into left hip/buttock - MRI l spine done - results reviewed today: L5-S1 left disc protrusion Pain not improved with pred, has not yet started diclofenac from ER Past Medical History  Diagnosis Date  . Obesity, unspecified   . PANIC DISORDER   . ANXIETY   . DEPRESSION   . DIABETES MELLITUS, TYPE II dx 04/2009  . GERD      Review of Systems  Constitutional: Negative for fever.  Respiratory: Negative for shortness of breath.   Cardiovascular: Negative for chest pain.  Musculoskeletal: Positive for back pain. Negative for joint swelling.  Neurological: Negative for syncope, weakness, light-headedness, numbness and headaches.       Objective:   Physical Exam BP 122/78  Pulse 89  Temp(Src) 97.6 F (36.4 C) (Oral)  Ht 5\' 4"  (1.626 m)  Wt 234 lb 6.4 oz (106.323 kg)  BMI 40.23 kg/m2  SpO2 98% Physical Exam  Constitutional: She is obese; oriented to person, place, and time. She appears well-developed and well-nourished. Uncomfortable but no acute distress.  HENT: Head: Normocephalic and atraumatic. Ears; B TMs ok, no erythema or effusion; Nose: Nose normal.  Mouth/Throat: Oropharynx is clear and moist. No oropharyngeal exudate.  Eyes: Conjunctivae and EOM are normal. Pupils are equal, round, and reactive to light. No scleral icterus.  Neck: Normal range of motion. Neck supple. No JVD present. No thyromegaly present.  Cardiovascular: Normal rate, regular rhythm and normal heart sounds.  No murmur heard. No BLE edema. Pulmonary/Chest: Effort normal and breath sounds normal. No respiratory distress. She has no wheezes. Musculoskeletal:  Back: full range of motion of thoracic and lumbar spine. Non tender to palpation. DTR's are symmetrically intact.  Sensation intact in all dermatomes of the lower extremities. Full strength to manual muscle testing - able to heel toe walk without difficulty and ambulates with a normal gait. Neurological: She is alert and oriented to person, place, and time. No cranial nerve deficit. Coordination normal.  Psychiatric: She has a normal mood and affect. Her behavior is normal. Judgment and thought content normal.   Lab Results  Component Value Date   WBC 11.5* 07/11/2010   HGB 15.3* 07/11/2010   HCT 45.0 07/11/2010   PLT 323 07/11/2010   CHOL 171 05/20/2009   TRIG 123.0 05/20/2009   HDL 34.20* 05/20/2009   ALT 23 05/20/2009   AST 16 05/20/2009   NA 135 07/11/2010   K 4.0 DELTA CHECK NOTED REPEATED TO VERIFY 07/11/2010   CL 107 07/11/2010   CREATININE 0.80 07/11/2010   BUN 8 07/11/2010   CO2 24 08/03/2009   TSH 1.35 05/20/2009   INR 0.9 04/07/2008   HGBA1C 7.4* 06/20/2010   MICROALBUR 1.1 08/18/2009          Assessment & Plan:  See problem list. Medications and labs reviewed today.

## 2010-07-17 NOTE — Telephone Encounter (Signed)
Pt in office today and will be advised by MD

## 2010-07-17 NOTE — Assessment & Plan Note (Signed)
MRI L spine reviewed with pt today - large disc protrusion L L5-S1 - refer to Nsurg done (Pool) Little relief with prior pred pack - ok to use diclofenac as rx'd by ER, continue Robaxin and few vicodin provided for breakthru symptoms until seen by Nsurg Work note provided for today at pt request for OV

## 2010-07-17 NOTE — Telephone Encounter (Signed)
Left message on machine for pt to return my call  

## 2010-07-20 ENCOUNTER — Observation Stay (HOSPITAL_COMMUNITY)
Admission: RE | Admit: 2010-07-20 | Discharge: 2010-07-20 | Disposition: A | Discharge status: Home / Self Care | Payer: BC Managed Care – PPO | Source: Ambulatory Visit | Attending: Neurological Surgery | Admitting: Neurological Surgery

## 2010-07-20 ENCOUNTER — Ambulatory Visit (HOSPITAL_COMMUNITY): Payer: BC Managed Care – PPO

## 2010-07-20 DIAGNOSIS — K219 Gastro-esophageal reflux disease without esophagitis: Secondary | ICD-10-CM | POA: Insufficient documentation

## 2010-07-20 DIAGNOSIS — Z01812 Encounter for preprocedural laboratory examination: Secondary | ICD-10-CM | POA: Insufficient documentation

## 2010-07-20 DIAGNOSIS — E669 Obesity, unspecified: Secondary | ICD-10-CM | POA: Insufficient documentation

## 2010-07-20 DIAGNOSIS — E119 Type 2 diabetes mellitus without complications: Secondary | ICD-10-CM | POA: Insufficient documentation

## 2010-07-20 DIAGNOSIS — Z79899 Other long term (current) drug therapy: Secondary | ICD-10-CM | POA: Insufficient documentation

## 2010-07-20 DIAGNOSIS — M5126 Other intervertebral disc displacement, lumbar region: Principal | ICD-10-CM | POA: Insufficient documentation

## 2010-07-20 HISTORY — PX: SPINE SURGERY: SHX786

## 2010-07-20 LAB — COMPREHENSIVE METABOLIC PANEL
CO2: 27 mEq/L (ref 19–32)
Calcium: 9.4 mg/dL (ref 8.4–10.5)
Creatinine, Ser: 0.63 mg/dL (ref 0.4–1.2)
GFR calc non Af Amer: >60 mL/min (ref >60)
Glucose, Bld: 125 mg/dL — ABNORMAL HIGH (ref 70–99)
Total Protein: 6.6 g/dL (ref 6.0–8.3)

## 2010-07-20 LAB — PROTIME-INR
INR: 1.07 (ref 0.00–1.49)
Prothrombin Time: 14.1 seconds (ref 11.6–15.2)

## 2010-07-20 LAB — GLUCOSE, CAPILLARY
Glucose-Capillary: 137 mg/dL — ABNORMAL HIGH (ref 70–99)
Glucose-Capillary: 149 mg/dL — ABNORMAL HIGH (ref 70–99)

## 2010-07-20 LAB — CBC
Hemoglobin: 13.9 g/dL (ref 12.0–15.0)
MCHC: 33.4 g/dL (ref 30.0–36.0)
RDW: 13.1 % (ref 11.5–15.5)
WBC: 10.5 10*3/uL (ref 4.0–10.5)

## 2010-07-20 LAB — DIFFERENTIAL
Basophils Absolute: 0 10*3/uL (ref 0.0–0.1)
Basophils Relative: 0 % (ref 0–1)
Lymphocytes Relative: 38 % (ref 12–46)
Monocytes Absolute: 0.7 10*3/uL (ref 0.1–1.0)
Neutro Abs: 5.8 10*3/uL (ref 1.7–7.7)

## 2010-07-20 LAB — SURGICAL PCR SCREEN
MRSA, PCR: NEGATIVE
Staphylococcus aureus: NEGATIVE

## 2010-07-20 LAB — APTT: aPTT: 25 seconds (ref 24–37)

## 2010-07-28 ENCOUNTER — Ambulatory Visit: Payer: BC Managed Care – PPO

## 2010-07-28 DIAGNOSIS — Z3049 Encounter for surveillance of other contraceptives: Secondary | ICD-10-CM

## 2010-08-08 NOTE — Op Note (Signed)
Emily [name], Emily [name]           ACCOUNT NO.:  [id number]  MEDICAL RECORD NO.:  [id number]           PATIENT TYPE:  O  LOCATION:  3534                         FACILITY:  [facility]  PHYSICIAN:  Tia Alert, MD     DATE OF BIRTH:  1976-02-11  DATE OF PROCEDURE:  07/20/2010 DATE OF DISCHARGE:  07/20/2010                              OPERATIVE REPORT   PREOPERATIVE DIAGNOSIS:  Left L5-S1 herniated disk with left S1 radiculopathy.  POSTOPERATIVE DIAGNOSIS:  Left L5-S1 herniated disk with left S1 radiculopathy.  PROCEDURE:  Left L5-S1 hemilaminectomy, medial facetectomy, and foraminotomy followed by microdiskectomy at L5-S1 left utilizing microscopic dissection.  SURGEON:  Tia Alert, MD  ASSISTANT:  Donalee Citrin, MD  ANESTHESIA:  General endotracheal.  COMPLICATIONS:  None apparent.  INDICATIONS FOR PROCEDURE:  Ms. Yzaguirre is a 35 year old female who presented to the office with severe left leg pain in S1 distribution. She had an MRI which showed a large herniated disk at L5-S1.  I recommended a decompressive laminectomy and microdiskectomy at L5-S1 on the left.  She understood the risks, benefits, and expected outcome and wished to proceed.  DESCRIPTION OF PROCEDURE:  The patient was taken to operating room and after induction of adequate generalized endotracheal anesthesia, she was rolled into the prone position on the Wilson frame and all pressure points were padded.  Her lumbar region was prepped with DuraPrep and then draped in usual sterile fashion.  Local anesthesia 4 mL was injected and a dorsal midline incision was made and carried down to lumbosacral fascia.  The fascia was opened and the paraspinous musculature was taken down in subperiosteal fashion at L5-S1 on the left.  Intraoperative x-ray confirmed my level and then I used a Kerrison punch and a high-speed drill to perform a hemilaminectomy, medial facetectomy, and foraminotomy at L4-S1 on the left side.   The underlying yellow ligament was opened and removed in a piecemeal fashion to expose the underlying dura and S1 nerve root.  The S1 nerve root was retracted medially by a D'Errico retractor.  We brought in the operating microscope.  We found a large disk herniation.  There was a calcified component to the midportion of the disk.  I was able to remove a free fragment at the beginning, then incised the disk space.  I was able to bring down the calcified portion with an Epstein curette.  I performed a thorough intradiscal diskectomy reaching the midline.  Once the diskectomy was complete, we palpated with a coronary dilator into the midline and along the S1 nerve root.  We felt no more compression of the nerve root.  We felt like we had an adequate decompression and diskectomy.  We irrigated with saline solution containing bacitracin, dried all bleeding points, lined the dura with Gelfoam, then closed the fascia with 0 Vicryl, closed the subcutaneous and subcuticular tissue with 2-0 and 3-0 Vicryl and closed the skin with Benzoin and Steri-Strips.  The drapes were removed.  Sterile dressing was applied.  The patient was awakened from general anesthesia and transferred to recovery room in stable condition.  At the end of the procedure, all  sponge, needle and instrument counts were correct.     Tia Alert, MD     DSJ/MEDQ  D:  07/20/2010  T:  07/21/2010  Job:  [id number]  Electronically Signed by Marikay Alar MD on 08/08/2010 09:46:33 AM

## 2010-08-09 NOTE — Discharge Summary (Signed)
  NAMEOLIVINE, HIERS NO.:  [id number]  MEDICAL RECORD NO.:  [id number]  LOCATION:  3534                         FACILITY:  [facility]  PHYSICIAN:  Tia Alert, MD     DATE OF BIRTH:  Aug 03, 1975  DATE OF ADMISSION:  07/20/2010 DATE OF DISCHARGE:  07/20/2010                              DISCHARGE SUMMARY   PROCEDURE:  Left L5-S1 microdiskectomy.  HOSPITAL COURSE:  The patient was admitted on Jul 20, 2010, taken to the operating room where she underwent a left L5-S1 microdiskectomy.  The patient tolerated the procedure well, was taken to the recovery room, and then to the floor in stable condition.  For details of the operative procedure, please see the dictated operative note.  The patient's hospital course was routine.  There were no complications.  She did very well following her surgery.  She had resolution of her leg pain.  She had appropriate back soreness and good strength in lower extremity.  She was ambulating in the halls.  She voided and ate without difficulty. She was discharged home in stable condition.  On Jul 20, 2010, we plans to follow up in 2 weeks.  FINAL DIAGNOSIS:  Microdiskectomy of the left L5-S1 on the left.     Tia Alert, MD     DSJ/MEDQ  D:  08/08/2010  T:  08/09/2010  Job:  [id number]  Electronically Signed by Marikay Alar MD on 08/09/2010 04:36:32 PM

## 2010-09-01 ENCOUNTER — Other Ambulatory Visit: Payer: Self-pay | Admitting: Internal Medicine

## 2010-09-01 ENCOUNTER — Ambulatory Visit (INDEPENDENT_AMBULATORY_CARE_PROVIDER_SITE_OTHER): Payer: Self-pay | Admitting: Internal Medicine

## 2010-09-01 ENCOUNTER — Encounter: Payer: Self-pay | Admitting: Internal Medicine

## 2010-09-01 VITALS — BP 128/82 | HR 92 | Temp 96.6°F | Ht 64.0 in | Wt 241.2 lb

## 2010-09-01 DIAGNOSIS — E119 Type 2 diabetes mellitus without complications: Secondary | ICD-10-CM

## 2010-09-01 DIAGNOSIS — F411 Generalized anxiety disorder: Secondary | ICD-10-CM

## 2010-09-01 DIAGNOSIS — E669 Obesity, unspecified: Secondary | ICD-10-CM

## 2010-09-01 DIAGNOSIS — M5416 Radiculopathy, lumbar region: Secondary | ICD-10-CM

## 2010-09-01 DIAGNOSIS — IMO0002 Reserved for concepts with insufficient information to code with codable children: Secondary | ICD-10-CM

## 2010-09-01 DIAGNOSIS — F410 Panic disorder [episodic paroxysmal anxiety] without agoraphobia: Secondary | ICD-10-CM

## 2010-09-01 DIAGNOSIS — K219 Gastro-esophageal reflux disease without esophagitis: Secondary | ICD-10-CM

## 2010-09-01 MED ORDER — SAXAGLIPTIN HCL 2.5 MG PO TABS: 2.5000 mg | ORAL_TABLET | Freq: Every day | ORAL | Status: DC

## 2010-09-01 MED ORDER — METFORMIN HCL 1000 MG PO TABS
1000.0000 mg | ORAL_TABLET | Freq: Two times a day (BID) | ORAL | Status: DC
Start: 2010-09-01 — End: 2012-06-13

## 2010-09-01 MED ORDER — ONETOUCH LANCETS MISC: Status: DC

## 2010-09-01 MED ORDER — OMEPRAZOLE 20 MG PO CPDR: 20.0000 mg | DELAYED_RELEASE_CAPSULE | Freq: Every day | ORAL | Status: DC

## 2010-09-01 MED ORDER — CITALOPRAM HYDROBROMIDE 10 MG PO TABS: 10.0000 mg | ORAL_TABLET | Freq: Every day | ORAL | Status: DC

## 2010-09-01 MED ORDER — LORAZEPAM 0.5 MG PO TABS: 0.5000 mg | ORAL_TABLET | Freq: Three times a day (TID) | ORAL | Status: DC | PRN

## 2010-09-01 MED ORDER — GLUCOSE BLOOD VI STRP: ORAL_STRIP | Status: AC

## 2010-09-01 MED ORDER — LISINOPRIL 10 MG PO TABS: 10.0000 mg | ORAL_TABLET | Freq: Every day | ORAL | Status: DC

## 2010-09-01 NOTE — Assessment & Plan Note (Signed)
Her diabetes is under fair control on oral medications, but could improve.  She explained that poor diet is her biggest difficulty.  Her recent weight gain is likely due to inactivity following lumbar surgery, but this has improved.  She has been walking every day at 6am if the weather has not been too hot.  Leg tingling may be related to diabetes, or may be related to her lumbar radiculopathy since she believes it has improved since surgery.  We will re-evaluate this on follow-up.  Plan: -Emphasized the importance of improving diet -Encouraged her to continue and lengthen her walks -Return to clinic in 3 wks for follow-up, for HbA1c, and to meet with our diabetes coordinator.  She also needs a pneumovax on that day.  -Records release signed to attain Lipid Panel information from [facility] [employer]) from March/April.

## 2010-09-01 NOTE — Assessment & Plan Note (Signed)
Emily [name]' weight is back up to 241 lbs Today from 234 lbs at last visit, likely due to inactivity since undergoing lumbar surgery.  We spoke at length about her difficulties eating well while taking care of her son.  Plan:  -Emphasized importance of improving diet -Encouraged her to continue and lengthen her morning walks -When she returns to clinic in 3 wks she will meet with our diabetes coordinator for advice.

## 2010-09-01 NOTE — Patient Instructions (Signed)
We have renewed your prescriptions for your medicines today.  Please return to clinic in 3 wks to see Dr. Yaakov Guthrie, have follow-up blood testing, and to meet with our diabetes coordinator.

## 2010-09-01 NOTE — Progress Notes (Deleted)
  Subjective:    Patient ID: Emily [name], female    DOB: 1975/08/03, 35 y.o.   MRN: [id number]  HPI    Review of Systems     Objective:   Physical Exam        Assessment & Plan:

## 2010-09-01 NOTE — Progress Notes (Signed)
Patient seen and examined. I agree with assessment and plan as per Dr. Yaakov Guthrie.

## 2010-09-01 NOTE — Assessment & Plan Note (Addendum)
Underwent L5-S1 L hemilaminectomy, medial facetectomy, and foraminotomy followed by microdiskectomy at L5-S1 on 07/21/10 by Dr. Yetta Barre in neurosurgery.  Ms. Lemley' back and leg pain are significant reduced since surgery.  Plan:  -Continue methocarbamol 750mg  TID PRN.

## 2010-09-01 NOTE — Progress Notes (Signed)
Subjective:    Patient ID: Emily [name], female    DOB: [birth year], 35 y.o.   MRN: [id number]  HPI  Ms. Campus is a 35 y/o woman with a a history of diabetes mellitus type 2, obesity, and anxiety who presents for diabetes care.  She has no acute issues today.  She is new to our clinic and was previous seen by Dr. Felicity Coyer at [facility].  She takes metformin and saxagliptan for her diabetes.  Her HbA1c has been between 7.2% and 7.7% since February 2010.  She explains that she has difficulty eating healthy, especially in the setting of caring for a 17 year old son with autism.  She does not overeat very much, but the foods she picks are often unhealthy.  She has lumbar radiculopathy which was significantly decreasing her mobility until surgery in May by Dr. Yetta Barre, since which her pain has been significantly improved.  While she did gain weight due to inactivity during recovery, she has since begun to take morning walks down her block.  She does experience some tingling and burning in her feet, but believes this has improved since the surgery as well.  She believes that she can increase her physical activity now that she has recovered from the surgery and has much less pain than beforehand.    She has a history of panic attacks and anxiety.  These have been improved in recent months compared to last year.  She takes scheduled citalopram for this.  She also takes Ativan when she feels she needs extra help with anxiety, but she has not needed to take this for six months.     Review of Systems  Review of Systems - History obtained from the patient General ROS: negative for - chills, fatigue, fever or sleep disturbance Endocrine ROS: negative for - palpitations, polydipsia/polyuria or unexpected weight changes Respiratory ROS: negative for - cough or shortness of breath Cardiovascular ROS: negative for - chest pain, palpitations or shortness of breath Gastrointestinal ROS: negative for -  constipation, diarrhea or nausea/vomiting Genito-Urinary ROS: negative for - dysuria or urinary frequency/urgency Neurological ROS: positive for - tingling and burning in feet Dermatological ROS: positive for - dry skin on feet, negative for- rashes      Objective:   Physical Exam  General: alert, well-developed, and cooperative to examination.  Head: normocephalic and atraumatic.  Eyes: vision grossly intact, pupils equal, pupils round, pupils reactive to light, no injection and anicteric.  Mouth: pharynx pink and moist, no erythema, and no exudates.  Lungs: normal respiratory effort, no accessory muscle use, normal breath sounds, no crackles, and no wheezes. Heart: normal rate, regular rhythm, no murmur, no gallop, and no rub.  Abdomen: soft, non-tender, normal bowel sounds, no distention, no guarding, no rebound tenderness. Pulses: 2+ DP/PT pulses bilaterally Extremities: No cyanosis, clubbing, edema         Foot exam: Sensation intact to light touch throughout.  No sores or erythema.  Mild dryness around the periphery of the soles bilaterally. Neurologic: alert & oriented X3, cranial nerves II-XII intact, strength normal in all extremities. Skin: turgor normal and no rashes.  Psych: Oriented X3, memory intact for recent and remote, normally interactive, good eye contact, not anxious appearing, and not depressed appearing.       Assessment & Plan:

## 2010-09-01 NOTE — Assessment & Plan Note (Addendum)
Emily [name] has been doing well with her anxiety and panic attacks recently.  We spoke at length about how she copes with her panic attacks, and she is happy with her current medicines for this.  Plan:  -Continue citalopram 10mg  daily and Ativan 0.5mg  [id number]

## 2010-09-19 ENCOUNTER — Ambulatory Visit: Payer: BC Managed Care – PPO | Admitting: Internal Medicine

## 2010-09-19 ENCOUNTER — Ambulatory Visit (INDEPENDENT_AMBULATORY_CARE_PROVIDER_SITE_OTHER): Payer: Self-pay | Admitting: Internal Medicine

## 2010-09-19 ENCOUNTER — Other Ambulatory Visit: Payer: Self-pay | Admitting: Internal Medicine

## 2010-09-19 ENCOUNTER — Other Ambulatory Visit (INDEPENDENT_AMBULATORY_CARE_PROVIDER_SITE_OTHER): Payer: Self-pay

## 2010-09-19 ENCOUNTER — Encounter: Payer: Self-pay | Admitting: Internal Medicine

## 2010-09-19 DIAGNOSIS — E119 Type 2 diabetes mellitus without complications: Secondary | ICD-10-CM

## 2010-09-19 DIAGNOSIS — Z23 Encounter for immunization: Secondary | ICD-10-CM

## 2010-09-19 DIAGNOSIS — F411 Generalized anxiety disorder: Secondary | ICD-10-CM

## 2010-09-19 LAB — LDL CHOLESTEROL, DIRECT: Direct LDL: 96.5 mg/dL

## 2010-09-19 LAB — LIPID PANEL
HDL: 38.5 mg/dL — ABNORMAL LOW (ref >39.00)
Total CHOL/HDL Ratio: 4
VLDL: 51.2 mg/dL — ABNORMAL HIGH (ref 0.0–40.0)

## 2010-09-19 MED ORDER — PNEUMOCOCCAL VAC POLYVALENT 25 MCG/0.5ML IJ INJ: 0.5000 mL | INJECTION | Freq: Once | INTRAMUSCULAR | Status: DC

## 2010-09-19 NOTE — Assessment & Plan Note (Signed)
Med use complicated by uninsured status - now following with [facility] for same Meeting with nutritionist this week for same - On metformin - also onglyza was doing well - but $$$ prohibitive. Samples given today until further discussion with new provider Reminded of need for exercise, diet and ongoing weight loss Check a1c now +lipids pneumo shot done today, continue ACEI

## 2010-09-19 NOTE — Patient Instructions (Signed)
It was good to see you today. Medications reviewed, no changes at this time. Samples on onglyza and card for 30days free medicine given to you today Test(s) ordered today. Your results will be called to you after review (48-72hours after test completion). If any changes need to be made, you will be notified at that time. Pneumonia shot done today Continue to work with your new doctors at the outpatient clinic and call us as needed

## 2010-09-19 NOTE — Progress Notes (Signed)
  Subjective:    Patient ID: Emily [name], female    DOB: [birth year], 35 y.o.   MRN: [id number]  HPI  here for follow up - reviewed chronic medcal issues: Changed PCP to [facility] due to loss of insurance & need for med asst but also plans to follow here "as needed"  DM2 - new dx 04/2009 at [facility] - has been to a nutrition class. checks sugars 1-2x/day - range 110-210 - inc metformin dose 07/2009 and added onglyza 02/2009. reports variable compliance with ongoing medical treatment, but denies adverse side effects related to current therapy.  anxiety/ panic attacks - on citalopram for same + prn ativan. complains of escalation of symptoms or attacks, related to death of family members (uncle, dad) summer 2011 and job termination 06/2010  GERD - well controlled, no reflux flares or abd pain -  obesity - working at gym with trainer, but only sporadic due to time restraints - water aerobics, weights; diet still subobtimal -   Past Medical History  Diagnosis Date  . Obesity, unspecified   . PANIC DISORDER   . ANXIETY   . DEPRESSION   . DIABETES MELLITUS, TYPE II dx 04/2009  . GERD     Review of Systems  Constitutional: Negative for fever.  Respiratory: Negative for cough.   Cardiovascular: Negative for chest pain.       Objective:   Physical Exam  Constitutional: She appears well-developed and well-nourished. No distress.       Obese.  Cardiovascular: Normal rate, regular rhythm and normal heart sounds.   Pulmonary/Chest: Effort normal and breath sounds normal. She has no wheezes.  Psychiatric: She has a normal mood and affect. Her behavior is normal.  BP 122/96  Pulse 98  Temp(Src) 97.8 F (36.6 C) (Oral)  Ht 5\' 4"  (1.626 m)  Wt 240 lb 12.8 oz (109.226 kg)  BMI 41.33 kg/m2  SpO2 98% Lab Results  Component Value Date   WBC 10.5 07/20/2010   HGB 13.9 07/20/2010   HCT 41.6 07/20/2010   PLT 325 07/20/2010   CHOL 170 09/19/2010   TRIG 256.0* 09/19/2010   HDL 38.50* 09/19/2010   LDLDIRECT 96.5 09/19/2010   ALT 41* 07/20/2010   AST 24 07/20/2010   NA 137 07/20/2010   K 3.6 07/20/2010   CL 101 07/20/2010   CREATININE 0.63 07/20/2010   BUN 9 07/20/2010   CO2 27 07/20/2010   TSH 1.35 05/20/2009   INR 1.07 07/20/2010   HGBA1C 8.4* 09/19/2010   MICROALBUR 1.1 08/18/2009   Wt Readings from Last 3 Encounters:  09/19/10 240 lb 12.8 oz (109.226 kg)  09/01/10 241 lb 3.2 oz (109.408 kg)  07/17/10 234 lb 6.4 oz (106.323 kg)      Assessment & Plan:  See problem list. Medications and labs reviewed today.

## 2010-09-19 NOTE — Assessment & Plan Note (Signed)
exac by stressors and work and family and health Prior sertraline not tolerated - doing well on citalopram in addition to prn BZ - continue same

## 2010-09-21 ENCOUNTER — Encounter: Payer: Self-pay | Admitting: Internal Medicine

## 2010-09-21 ENCOUNTER — Ambulatory Visit (INDEPENDENT_AMBULATORY_CARE_PROVIDER_SITE_OTHER): Payer: Self-pay | Admitting: Internal Medicine

## 2010-09-21 VITALS — BP 130/82 | HR 98 | Temp 98.0°F | Ht 63.0 in

## 2010-09-21 DIAGNOSIS — M659 Synovitis and tenosynovitis, unspecified: Secondary | ICD-10-CM

## 2010-09-21 DIAGNOSIS — M7751 Other enthesopathy of right foot: Secondary | ICD-10-CM

## 2010-09-21 MED ORDER — DICLOFENAC SODIUM 75 MG PO TBEC: 75.0000 mg | DELAYED_RELEASE_TABLET | Freq: Two times a day (BID) | ORAL | Status: DC

## 2010-09-21 MED ORDER — KETOROLAC TROMETHAMINE 60 MG/2ML IM SOLN
60.0000 mg | Freq: Once | INTRAMUSCULAR | Status: AC
  Administered 2010-09-21: 60 mg via INTRAMUSCULAR

## 2010-09-21 NOTE — Patient Instructions (Addendum)
It was good to see you today. Shot for pain today - Voltaren antiinflammatory pain meds for ankle tendonitis - ice 3-4x/day and keep elevated, wear good shoes and avoid injury until pain resolved If worse, call for xray or reevalution as needed

## 2010-09-21 NOTE — Progress Notes (Signed)
  Subjective:    Patient ID: Emily [name], female    DOB: Jan 08, 1976, 35 y.o.   MRN: [id number]  HPI  complains of right ankle pain Onset 12h ago -  Pain over inner right ankle - Denies precipitating injury - no rolling ankle or fall No hx same No bruising or swelling  Past Medical History  Diagnosis Date  . Obesity, unspecified   . PANIC DISORDER   . ANXIETY   . DEPRESSION   . DIABETES MELLITUS, TYPE II dx 04/2009  . GERD     Review of Systems  Constitutional: Negative for fever.  Cardiovascular: Negative for leg swelling.  Musculoskeletal: Positive for gait problem. Negative for joint swelling.       Objective:   Physical Exam BP 130/82  Pulse 98  Temp(Src) 98 F (36.7 C) (Oral)  Ht 5\' 3"  (1.6 m)  SpO2 98%  Constitutional: She is overweight -oriented to person, place, and time. She appears well-developed and well-nourished. No distress. Cardiovascular: Normal rate, regular rhythm and normal heart sounds.  No murmur heard. No BLE edema. Pulmonary/Chest: Effort normal and breath sounds normal. No respiratory distress. She has no wheezes.  Musculoskeletal: R ankle - tender over medial malleolus - no swelling or bruise - Normal range of motion, no joint effusions. No gross deformities. neurovasc intact       Assessment & Plan:  R ankle pain - due to tendonitis - no trauma for hx avulsion fx or injjury - NSAIDs - IM now and po votaren x 10d - ice and elevate, avoid painful activity - good foot/shoe support

## 2010-09-22 ENCOUNTER — Encounter: Payer: Self-pay | Admitting: Internal Medicine

## 2010-09-22 ENCOUNTER — Other Ambulatory Visit: Payer: Self-pay | Admitting: Dietician

## 2010-09-22 ENCOUNTER — Ambulatory Visit (INDEPENDENT_AMBULATORY_CARE_PROVIDER_SITE_OTHER): Payer: Self-pay | Admitting: Internal Medicine

## 2010-09-22 ENCOUNTER — Ambulatory Visit (INDEPENDENT_AMBULATORY_CARE_PROVIDER_SITE_OTHER): Payer: Self-pay | Admitting: Dietician

## 2010-09-22 VITALS — BP 115/78 | HR 85 | Temp 98.2°F | Resp 20 | Ht 64.0 in | Wt 243.8 lb

## 2010-09-22 DIAGNOSIS — F329 Major depressive disorder, single episode, unspecified: Secondary | ICD-10-CM

## 2010-09-22 DIAGNOSIS — M76829 Posterior tibial tendinitis, unspecified leg: Secondary | ICD-10-CM | POA: Insufficient documentation

## 2010-09-22 DIAGNOSIS — E119 Type 2 diabetes mellitus without complications: Secondary | ICD-10-CM

## 2010-09-22 LAB — GLUCOSE, CAPILLARY: Glucose-Capillary: 156 mg/dL — ABNORMAL HIGH (ref 70–99)

## 2010-09-22 MED ORDER — WAVESENSE PRESTO W/DEVICE KIT: PACK | Status: DC

## 2010-09-22 MED ORDER — CITALOPRAM HYDROBROMIDE 20 MG PO TABS: 10.0000 mg | ORAL_TABLET | Freq: Every day | ORAL | Status: DC

## 2010-09-22 NOTE — Progress Notes (Signed)
Diabetes Self-Management Training (DSMT)  Initial Visit  09/22/2010 [name], identified by name and date of birth, is a 35 y.o. female with Type 2 Diabetes. Year of diabetes diagnosis: 2011 Other persons present: no  ASSESSMENT Patient concerns are Medication, Monitoring, Healthy Lifestyle, Problem solving, Glycemic control, Weight control and Support.  There were no vitals taken for this visit. There is no height or weight on file to calculate BMI. Lab Results  Component Value Date   LDLCALC 112* 05/20/2009   Lab Results  Component Value Date   HGBA1C 8.4* 09/19/2010   Medication Nutrition Monitor: wavesense presto  Labs reviewed.  DIABETES BUNDLE: A1C in past 6 months? Yes.  Less than 7%? No LDL in past year? Yes.  Less than 100 mg/dL? No Microalbumin ratio in past year? No. Patient taking ACE or ARB? Yes. Blood pressure less than 130/80? Yes. Foot exam in last year? Yes. Eye exam in past year? Yes. Tobacco use? No. Pneumovax? Yes Flu vaccine? Yes Asprin? No  Family history of diabetes: No  Support systems: friends and family  Special needs: None  Prior DM Education: Yes   Medications See Medications list.  Is interested in learning more  Patients belief/attitude about diabetes: Diabetes can be controlled.  Self foot exams daily: Yes  Diabetes Complications: None   Exercise Plan Doing walking for 30 minutesa day.   Self-Monitoring Frequency of testing: daily  Hyperglycemia: No Hypoglycemia: No   Meal Planning Some knowledge and Interested in improving   Assessment comments: VERY MOtivated to make behavior changes and is currently working on some. Has good social support in her family and friends and good outlook. Suggest encouraging continued behavior change without weight gain and hypoglycemia suggested using medications such as DPP-4 inhibitors and incretin analogs/mimetics.    INDIVIDUAL DIABETES EDUCATION PLAN:  Nutrition  management Medication Monitoring _______________________________________________________________________  Intervention TOPICS COVERED TODAY:  Nutrition management  Role of diet in the treatment of diabetes and the relationship between the three main macronutritents and blood glucose control. Food label reading, portion sizes and measuring food. Reviewed blood glucose goals for pre and post meals and how to evaluate the patients' food intake on their blood glucose level. Information on hints to eating out and maintain blood glucose control. Medication  Reviewed patients medication for diabetes, action, purpose, timing of dose and side effects. Monitoring  Purpose and frequency of SMBG.  PATIENTS GOALS/PLAN (copy and paste in patient instructions so patient receives a copy): 1.  Learning Objective:       Understand actions of medications. Know how to get support for behavior change 2.  Behavioral Objective:         Nutrition: To improve blood glucose control I will follow meal plan of low calories and low carbs for weight loss and blood sugar control Half of the time 50% Medications: To improve blood glucose levels, I will take my medication as prescribed Most of the time 75% Monitoring: To identify blood glucose trends, I will test my blood glucose once daily as needed and report blood sugar consistently more than 200mg /dl day and  Sometimes 16%  Personalized Follow-Up Plan for Ongoing Self Management Support:  Doctor's Office, friends, family and CDE visits ______________________________________________________________________   Outcomes Expected outcomes: Demonstrated interest in learning.Expect positive changes in lifestyle.  Self-care Barriers: Lack of material resources  Education material provided: carb counting handout  Patient to contact team via Phone if problems or questions.  Time in: 1530     Time  out: 1600  Future DSMT - 4-6 wks   Emily [name]

## 2010-09-22 NOTE — Progress Notes (Signed)
Subjective:    Patient ID: Emily [name], female    DOB: 1975/12/05, 35 y.o.   MRN: [id number]  HPI Emily [name] is a 35 year old woman with a history of type 2 DM, obesity, and depression who presents for follow-up.  She was previously seen by Dr. Felicity Coyer at [facility] and switching to our clinic since she lost insurance.  However, she actually returned to Dr. Felicity Coyer this week for both a diabetes follow-up and an acute visit for ankle pain.  She returns to [place] today for diabetes follow-up as well.    Her HbA1c was elevated to 8.4% this week from 7.4% in April.  She reports worsened diet.  She also has not had her pills at times.  For 2-3 weeks before last seeing me, she was without both metformin and onglyza.  She has not been on metformin for 3 weeks and just started onglyza this week, so she was without onglyza for over a month.  She reports no fever, chills, numbness, tingling, or polyuria.  She has a lipid panel done by Dr. Felicity Coyer that showed no evidence of hyperlipidemia, and she received a pneumovax.  She has depression treated with citalopram.  She reports that the county pharmacy does not have this on formulary and she would need to get it through the medication assistance program, so she has been not taking this for a few weeks as well.   She also has had recent ankle pain with use and no history of trauma.  She was diagnosed with tendonitis by Dr. Felicity Coyer and given a toradol shot, 10 days of diclofenac, and advised to ice and rest the ankle.      Review of Systems ROS: General: no fevers, chills, weight loss, changes in appetite Skin: no rash HEENT: no blurry vision, hearing changes, sore throat Pulm: no dyspnea, coughing, wheezing CV: no chest pain, palpitations, shortness of breath Abd: no abdominal pain, nausea/vomiting, diarrhea/constipation GU: no dysuria, hematuria, polyuria Ext: no arthralgias, myalgias Neuro: no weakness, numbness, or tingling Psych: No  thoughts of self-harm    Objective:   Physical Exam General: alert, well-developed, and cooperative to examination.  Head: normocephalic and atraumatic.  Eyes: vision grossly intact, pupils equal, pupils round, pupils reactive to light, no injection and anicteric.  Mouth: pharynx pink and moist, no erythema, and no exudates.  Neck: no JVD.  Lungs: normal respiratory effort, no accessory muscle use, normal breath sounds, no crackles, and no wheezes. Heart: normal rate, regular rhythm, no murmur, no gallop, and no rub.  Msk: no joint swelling, no joint warmth, and no redness over joints.  Pulses: 2+ DP/PT pulses bilaterally Extremities: No cyanosis, clubbing, edema  R foot: Pain to palpation over ankle medially, worst inferior to medial malleolus.  Attempts to plantarflex against resistance cause mild pain.  Attempts to plantarflex-evert against resistance cause significantly more pain.  Neurologic: alert & oriented X3, cranial nerves II-XII intact, strength normal in all extremities, sensation intact to light touch, and gait normal.  Skin: turgor normal and no rashes.  Psych: Oriented X3, memory intact for recent and remote, normally interactive, good eye contact, not anxious appearing, and not depressed appearing.    Assessment & Plan:

## 2010-09-22 NOTE — Assessment & Plan Note (Signed)
Citalopram not on county pharmacy formulary and Ms. Hogrefe does not want to have to get this through the med assistance program.  [facility] offers 20mg  as part of the $4 plan.  I gave a new prescription today (printed) for 20mg  Citalopram 1/2 tablet daily.  Instructed Ms. Delio to take this to [facility] and also inquire about a pill cutter there.  If these particular pills cannot be cut in half, she will have to get Celexa 10mg  through med assistance program at the [employer].

## 2010-09-22 NOTE — Progress Notes (Signed)
I discussed patient with resident Dr. Yaakov Guthrie.  I agree with the clinical findings and plans as outlined in his note.

## 2010-09-22 NOTE — Assessment & Plan Note (Addendum)
HbA1c this week elevated from April.  This is due to worsened diet and worsened medication compliance.  Ms. Victory Dakin notes improved sugars this week since restarting onglyza and after restarting metformin 2-3 weeks earlier.    Patient and I spoke with Rudell Cobb today about the rules of the orange card program.  She cannot continue to see [facility], so we alone will be following her diabetes for now on.  Plan: Continue metformin 1000mg  BID and Onglyza.  Monitor daily sugars and return to clinic in one month to re-evaluate. Due to her obesity, onglyza (weight neutral) is a better medication for her than glipizide would be, so if possible it would be best if she could stay on this beyond her 1 month of supply she current has.  The med assistance program through county pharmacy may be able to get onglyza. Meet with Jamison Neighbor today.

## 2010-09-22 NOTE — Progress Notes (Deleted)
  Subjective:    Patient ID: [name], female    DOB: [birth year], 35 y.o.   MRN: [id number]  HPI    Review of Systems     Objective:   Physical Exam        Assessment & Plan:   DIABETES MELLITUS, TYPE II HbA1c this week elevated from April.  This is due to worsened diet and worsened medication compliance.  Ms. Victory Dakin notes improved sugars this week since restarting onglyza and after restarting metformin 2-3 weeks earlier.    Patient and I spoke with Rudell Cobb today about the rules of the orange card program.  She cannot continue to see  primary care, so we alone will be following her diabetes for now on.  Plan: Continue metformin 1000mg  BID and Onglyza.  Monitor daily sugars and return to clinic in one month to re-evaluate. Consider switching onglyza to glipizide at that time for financial reasons in this patient.  Med assistance program through county pharmacy may be able to get onglyza, but as Ms. Swiech' situation changes in the future there could be an issue of continuity on this expensive medicine.  DEPRESSION Citalopram not on county pharmacy formulary and Ms. Craigo does not want to have to get this through the med assistance program.  [facility] offers 20mg  as part of the $4 plan.  I gave a new prescription today (printed) for 20mg  Citalopram 1/2 tablet daily.  Instructed Ms. Konicek to take this to [facility] and also inquire about a pill cutter there.  If these particular pills cannot be cut in half, she will have to get Celexa 10mg  through med assistance program at the [employer].    Posterior tibial tendonitis Ms. Vosler's ankle pain is most likely posterior tibial tendonitis based on location and exam findings.  Received a toradol shot by Dr. Felicity Coyer already.  Agree with Dr. Diamantina Monks plan of 10 days of antiinflammatory, icing, elevation, and rest of the ankle.   Emphasized importance of good arch supports in shoes in future, especially when going a lot  of walking.  I also explained that weight loss may help prevent progression to pes planus.

## 2010-09-22 NOTE — Patient Instructions (Signed)
Please return in 1 month

## 2010-09-22 NOTE — Assessment & Plan Note (Addendum)
[name]'s ankle pain is most likely posterior tibial tendonitis based on location and exam findings.  Received a toradol shot by Dr. Felicity Coyer already.  Agree with Dr. Diamantina Monks plan of 10 days of antiinflammatory, icing, elevation, and rest of the ankle.   Emphasized importance of good arch supports in shoes in future, especially when going a lot of walking.  I also explained that weight loss may help prevent progression to pes planus.

## 2010-10-13 ENCOUNTER — Ambulatory Visit: Payer: BC Managed Care – PPO

## 2010-10-24 ENCOUNTER — Encounter: Payer: Self-pay | Admitting: Internal Medicine

## 2010-10-24 ENCOUNTER — Ambulatory Visit (INDEPENDENT_AMBULATORY_CARE_PROVIDER_SITE_OTHER): Payer: Self-pay | Admitting: Internal Medicine

## 2010-10-24 VITALS — BP 134/89 | HR 99 | Temp 96.9°F | Ht 64.0 in | Wt 242.4 lb

## 2010-10-24 DIAGNOSIS — E119 Type 2 diabetes mellitus without complications: Secondary | ICD-10-CM

## 2010-10-24 MED ORDER — GLIPIZIDE 5 MG PO TABS: 5.0000 mg | ORAL_TABLET | Freq: Two times a day (BID) | ORAL | Status: DC

## 2010-10-24 MED ORDER — TRAMADOL HCL 50 MG PO TABS: 50.0000 mg | ORAL_TABLET | Freq: Four times a day (QID) | ORAL | Status: DC | PRN

## 2010-10-24 NOTE — Assessment & Plan Note (Signed)
We will switch to Glipizide and will continue Metformin at the same dose 1000 mg twice daily.

## 2010-10-24 NOTE — Progress Notes (Signed)
  Subjective:    Patient ID: Emily [name], female    DOB: 1975-12-07, 35 y.o.   MRN: [id number]  HPI  Patient 35 year old female with past medical history as outlined below who presents to clinic for regular followup on diabetes and blood pressure. She reports not being able to afford one of her blood pressure medications and is questioning if we can possibly change the medication regimen to something more affordable. She denies recent sicknesses or hospitalizations, no episodes chest pain or shortness of breath, no abdominal urinary concerns, no fevers or chills.  Review of Systems Constitutional: Denies fever, chills, diaphoresis, appetite change and fatigue.  HEENT: Denies photophobia, eye pain, redness, hearing loss, ear pain, congestion, sore throat, rhinorrhea, sneezing, mouth sores, trouble swallowing, neck pain, neck stiffness and tinnitus.   Respiratory: Denies SOB, DOE, cough, chest tightness,  and wheezing.   Cardiovascular: Denies chest pain, palpitations and leg swelling.  Gastrointestinal: Denies nausea, vomiting, abdominal pain, diarrhea, constipation, blood in stool and abdominal distention.  Genitourinary: Denies dysuria, urgency, frequency, hematuria, flank pain and difficulty urinating.  Musculoskeletal: Denies myalgias, back pain, joint swelling, arthralgias and gait problem.  Skin: Denies pallor, rash and wound.  Neurological: Denies dizziness, seizures, syncope, weakness, light-headedness, numbness and headaches.  Hematological: Denies adenopathy. Easy bruising, personal or family bleeding history  Psychiatric/Behavioral: Denies suicidal ideation, mood changes, confusion, nervousness, sleep disturbance and agitation      Objective:   Physical Exam Constitutional: Vital signs reviewed.  Patient is a well-developed and well-nourished in no acute distress and cooperative with exam. Alert and oriented x3.  Cardiovascular: RRR, S1 normal, S2 normal, no MRG, pulses  symmetric and intact bilaterally Pulmonary/Chest: CTAB, no wheezes, rales, or rhonchi Abdominal: Soft. Non-tender, non-distended, bowel sounds are normal, no masses, organomegaly, or guarding present.  Psychiatric: Normal mood and affect. speech and behavior is normal. Judgment and thought content normal. Cognition and memory are normal.          Assessment & Plan:

## 2010-12-18 ENCOUNTER — Ambulatory Visit: Payer: Self-pay | Admitting: Internal Medicine

## 2010-12-20 ENCOUNTER — Ambulatory Visit: Payer: Self-pay | Admitting: Internal Medicine

## 2011-01-12 ENCOUNTER — Encounter: Payer: Self-pay | Admitting: Dietician

## 2011-03-14 ENCOUNTER — Encounter: Payer: Self-pay | Admitting: Internal Medicine

## 2011-03-14 ENCOUNTER — Ambulatory Visit (INDEPENDENT_AMBULATORY_CARE_PROVIDER_SITE_OTHER): Payer: Self-pay | Admitting: Internal Medicine

## 2011-03-14 DIAGNOSIS — N938 Other specified abnormal uterine and vaginal bleeding: Secondary | ICD-10-CM | POA: Insufficient documentation

## 2011-03-14 DIAGNOSIS — N949 Unspecified condition associated with female genital organs and menstrual cycle: Secondary | ICD-10-CM

## 2011-03-14 DIAGNOSIS — F329 Major depressive disorder, single episode, unspecified: Secondary | ICD-10-CM

## 2011-03-14 DIAGNOSIS — K219 Gastro-esophageal reflux disease without esophagitis: Secondary | ICD-10-CM

## 2011-03-14 DIAGNOSIS — M76829 Posterior tibial tendinitis, unspecified leg: Secondary | ICD-10-CM

## 2011-03-14 DIAGNOSIS — E119 Type 2 diabetes mellitus without complications: Secondary | ICD-10-CM

## 2011-03-14 LAB — POCT GLYCOSYLATED HEMOGLOBIN (HGB A1C): Hemoglobin A1C: 7.8

## 2011-03-14 LAB — GLUCOSE, CAPILLARY: Glucose-Capillary: 200 mg/dL — ABNORMAL HIGH (ref 70–99)

## 2011-03-14 LAB — CBC
Hemoglobin: 14 g/dL (ref 12.0–15.0)
MCH: 29.9 pg (ref 26.0–34.0)
MCHC: 32.6 g/dL (ref 30.0–36.0)
Platelets: 311 10*3/uL (ref 150–400)
RDW: 13.3 % (ref 11.5–15.5)

## 2011-03-14 LAB — HCG, SERUM, QUALITATIVE: Preg, Serum: NEGATIVE

## 2011-03-14 MED ORDER — GLIPIZIDE 5 MG PO TABS: 5.0000 mg | ORAL_TABLET | Freq: Two times a day (BID) | ORAL | Status: DC

## 2011-03-14 MED ORDER — NORGESTIMATE-ETH ESTRADIOL 0.25-35 MG-MCG PO TABS: 1.0000 | ORAL_TABLET | Freq: Every day | ORAL | Status: DC

## 2011-03-14 NOTE — Assessment & Plan Note (Signed)
Depressed mood doing well.  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

## 2011-03-14 NOTE — Assessment & Plan Note (Signed)
Resolved per patient.  

## 2011-03-14 NOTE — Patient Instructions (Signed)
Return to clinic in 3 months

## 2011-03-14 NOTE — Assessment & Plan Note (Signed)
HbA1c 7.8% today.  Improved from previous, but still not at goal.  Sugars at home per pt report seem to agree with HbA1c value.  Has only been taking glipizide daily, so will increase to 5mg   BID as previous scheduled.  Since she is already taking Metformin BID taking glipizide BID alongside should not be too burdensome.  Instructed to check sugars twice per day on glipizide.   Eye exam referral today.

## 2011-03-14 NOTE — Assessment & Plan Note (Signed)
Doing well per pt report

## 2011-03-14 NOTE — Assessment & Plan Note (Addendum)
Has been bleeding for 18 days straight.  Sounds like a large amount of bleeding.  Stopped depo shots in April due to insurance change.  She had similar, actually worse bleeding in distant past before starting depo 8 years ago (this was why she was put on depo).   Pregnancy test today was negative CBC checked today showed Hgb stable Birth control prescription written today once pregnancy test result was confirmed and to be picked up by patient, since orange card does not pay for depo shots. OBGyn referral

## 2011-03-14 NOTE — Progress Notes (Signed)
Subjective:   Patient ID: Emily [name] female   DOB: Jan 07, 1976 36 y.o.   MRN: [id number]  HPI: [name] is a 36 y.o. woman with DM and history of dysfunction uterine bleeding in distant past before starting depo provera shots q11weeks who presents with heavy prolonged menstrual bleeding and stopped depo provera shots this past April due to losing her insurance.  Her first menses since stopping Depo came November 30th and lasted for 1 1/2 weeks.  This current menstrual bleeding started December 30th and has continued since.  It is heavy enough to bleed through multiple pads.  She often wakes at night and has to change her bed sheets.  She has noted clots.  She has been feeling tired, but she also sleeps only 4-5 hrs per night because she goes to school and takes care of her kids, and is woken often by bleeding.    Past Medical History  Diagnosis Date  . Obesity, unspecified   . PANIC DISORDER   . ANXIETY   . DEPRESSION   . DIABETES MELLITUS, TYPE II dx 04/2009  . GERD    Current Outpatient Prescriptions  Medication Sig Dispense Refill  . Calcium Carbonate (CALCIUM 600 PO) Take by mouth 2 (two) times daily.        Marland Kitchen glipiZIDE (GLUCOTROL) 5 MG tablet Take 1 tablet (5 mg total) by mouth 2 (two) times daily before a meal.  62 tablet  5  . glucose blood (ONE TOUCH ULTRA TEST) test strip Use as instructed  100 each  6  . lisinopril (PRINIVIL,ZESTRIL) 10 MG tablet Take 1 tablet (10 mg total) by mouth daily.  30 tablet  6  . metFORMIN (GLUCOPHAGE) 1000 MG tablet Take 1 tablet (1,000 mg total) by mouth 2 (two) times daily with a meal.  60 tablet  6  . omeprazole (PRILOSEC) 20 MG capsule Take 1 capsule (20 mg total) by mouth daily.  30 capsule  6  . ONE TOUCH LANCETS MISC Please use as directed  200 each  1  . citalopram (CELEXA) 20 MG tablet Take 20 mg by mouth daily.      . diclofenac (VOLTAREN) 75 MG EC tablet Take 1 tablet (75 mg total) by mouth 2 (two) times daily with a meal.  X 1 week, then as needed for pain  20 tablet  0  . LORazepam (ATIVAN) 0.5 MG tablet Take 1 tablet (0.5 mg total) by mouth every 8 (eight) hours as needed.  30 tablet  1  . medroxyPROGESTERone (DEPO-PROVERA) 150 MG/ML injection Inject 150 mg into the muscle every 3 (three) months.        . methocarbamol (ROBAXIN) 750 MG tablet Take 1 tablet (750 mg total) by mouth 3 (three) times daily as needed.  30 tablet  0  . norgestimate-ethinyl estradiol (ORTHO-CYCLEN,SPRINTEC,PREVIFEM) 0.25-35 MG-MCG tablet Take 1 tablet by mouth daily.  1 Package  11  . traMADol (ULTRAM) 50 MG tablet Take 1 tablet (50 mg total) by mouth every 6 (six) hours as needed.  60 tablet  1   Current Facility-Administered Medications  Medication Dose Route Frequency Provider Last Rate Last Dose  . pneumococcal 23 valent vaccine (PNU-IMMUNE) injection 0.5 mL  0.5 mL Intramuscular Once Rene Paci, MD       Family History  Problem Relation Age of Onset  . Sarcoidosis Mother 10  . Asthma Mother   . Hyperlipidemia Mother   . Hypertension Mother   . Coronary artery disease Father  Had CABG, and angioplasty around age 75  . Diabetes Father   . Pulmonary fibrosis Sister 30  . Asthma Sister    History   Social History  . Marital Status: Legally Separated    Spouse Name: N/A    Number of Children: N/A  . Years of Education: N/A   Social History Main Topics  . Smoking status: Never Smoker   . Smokeless tobacco: None   Comment: Has 93 yt old son Dorma Russell who lives with her. (Autism & ADD) Work at [employer]  . Alcohol Use: No  . Drug Use: No  . Sexually Active: None   Other Topics Concern  . None   Social History Narrative  . None   Review of Systems: Constitutional: Denies fever, chills, diaphoresis, appetite change Respiratory: Denies SOB, DOE, cough, chest tightness,  and wheezing.   Cardiovascular: Denies chest pain, palpitations and leg swelling.  Gastrointestinal: Denies nausea, vomiting,  abdominal pain, diarrhea, constipation, blood in stool and abdominal distention.  Genitourinary: Denies dysuria, urgency, frequency,  Skin: Denies pallor, rash and wound.  Neurological: Denies dizziness, seizures, syncope, weakness, numbness and headaches.    Objective:  Physical Exam: Filed Vitals:   03/14/11 0833  BP: 143/89  Pulse: 90  Temp: 96.9 F (36.1 C)  TempSrc: Oral  Height: 5\' 4"  (1.626 m)  Weight: 250 lb 3.2 oz (113.49 kg)   Constitutional: Vital signs reviewed.  Patient is a well-developed and well-nourished woman in no acute distress and cooperative with exam. Alert and oriented x3.  Head: Normocephalic and atraumatic Mouth: no erythema or exudates, MMM Eyes: PERRL, EOMI, conjunctivae normal, No scleral icterus.  Neck: No JVD  Cardiovascular: RRR, S1 normal, S2 normal, no MRG, pulses symmetric and intact bilaterally Pulmonary/Chest: CTAB, no wheezes, rales, or rhonchi Neurological: A&O x3, Strength is normal and symmetric bilaterally, cranial nerve II-XII are grossly intact, no focal motor deficit, sensory intact to light touch bilaterally.  Skin: Warm, dry and intact. No rash, cyanosis, or clubbing.    Assessment & Plan:

## 2011-03-15 ENCOUNTER — Ambulatory Visit (INDEPENDENT_AMBULATORY_CARE_PROVIDER_SITE_OTHER): Payer: Self-pay

## 2011-03-15 DIAGNOSIS — Z23 Encounter for immunization: Secondary | ICD-10-CM

## 2011-03-16 NOTE — Progress Notes (Signed)
I agree with Dr. Wainwright's assessment and plan. 

## 2011-04-09 ENCOUNTER — Ambulatory Visit: Payer: Self-pay | Admitting: Family

## 2011-04-13 ENCOUNTER — Ambulatory Visit (INDEPENDENT_AMBULATORY_CARE_PROVIDER_SITE_OTHER): Payer: Self-pay | Admitting: Family

## 2011-04-13 ENCOUNTER — Encounter: Payer: Self-pay | Admitting: Family

## 2011-04-13 VITALS — BP 134/90 | HR 85 | Temp 98.1°F | Ht 64.0 in | Wt 249.6 lb

## 2011-04-13 DIAGNOSIS — N939 Abnormal uterine and vaginal bleeding, unspecified: Secondary | ICD-10-CM

## 2011-04-13 DIAGNOSIS — N938 Other specified abnormal uterine and vaginal bleeding: Secondary | ICD-10-CM

## 2011-04-13 DIAGNOSIS — N926 Irregular menstruation, unspecified: Secondary | ICD-10-CM

## 2011-04-13 DIAGNOSIS — Z01812 Encounter for preprocedural laboratory examination: Secondary | ICD-10-CM

## 2011-04-13 LAB — POCT PREGNANCY, URINE: Preg Test, Ur: NEGATIVE

## 2011-04-13 MED ORDER — MEDROXYPROGESTERONE ACETATE 150 MG/ML IM SUSP
150.0000 mg | Freq: Once | INTRAMUSCULAR | Status: AC
  Administered 2011-04-13: 150 mg via INTRAMUSCULAR

## 2011-04-13 MED ORDER — MEDROXYPROGESTERONE ACETATE 150 MG/ML IM SUSP: 150.0000 mg | Freq: Once | INTRAMUSCULAR | Status: DC

## 2011-04-13 NOTE — Patient Instructions (Signed)
Abnormal Uterine Bleeding Abnormal uterine bleeding can have many causes. Some cases are simply treated, while others are more serious. There are several kinds of bleeding that is considered abnormal, including:  Bleeding between periods.     Bleeding after sexual intercourse.     Spotting anytime in the menstrual cycle.     Bleeding heavier or more than normal.     Bleeding after menopause.  CAUSES   There are many causes of abnormal uterine bleeding. It can be present in teenagers, pregnant women, women during their reproductive years, and women who have reached menopause. Your caregiver will look for the more common causes depending on your age, signs, symptoms and your particular circumstance. Most cases are not serious and can be treated. Even the more serious causes, like cancer of the female organs, can be treated adequately if found in the early stages. That is why all types of bleeding should be evaluated and treated as soon as possible. DIAGNOSIS   Diagnosing the cause may take several kinds of tests. Your caregiver may:  Take a complete history of the type of bleeding.     Perform a complete physical exam and Pap smear.     Take an ultrasound on the abdomen showing a picture of the female organs and the pelvis.     Inject dye into the uterus and Fallopian tubes and X-ray them (hysterosalpingogram).     Place fluid in the uterus and do an ultrasound (sonohysterogrqphy).     Take a CT scan to examine the female organs and pelvis.     Take an MRI to examine the female organs and pelvis. There is no X-ray involved with this procedure.     Look inside the uterus with a telescope that has a light at the end (hysteroscopy).     Scrap the inside of the uterus to get tissue to examine (Dilatation and Curettage, D&C).     Look into the pelvis with a telescope that has a light at the end (laparoscopy). This is done through a very small cut (incision) in the abdomen.  TREATMENT     Treatment will depend on the cause of the abnormal bleeding. It can include:  Doing nothing to allow the problem to take care of itself over time.     Hormone treatment.     Birth control pills.     Treating the medical condition causing the problem.     Laparoscopy.    Major or minor surgery     Destroying the lining of the uterus with electrical currant, laser, freezing or heat (uterine ablation).  HOME CARE INSTRUCTIONS    Follow your caregiver's recommendation on how to treat your problem.     See your caregiver if you missed a menstrual period and think you may be pregnant.     If you are bleeding heavily, count the number of pads/tampons you use and how often you have to change them. Tell this to your caregiver.     Avoid sexual intercourse until the problem is controlled.  SEEK MEDICAL CARE IF:    You have any kind of abnormal bleeding mentioned above.     You feel dizzy at times.     You are 36 years old and have not had a menstrual period yet.  SEEK IMMEDIATE MEDICAL CARE IF:    You pass out.     You are changing pads/tampons every 15 to 30 minutes.     You have belly (abdominal)   pain.     You have a temperature of 100 F (37.8 C) or higher.     You become sweaty or weak.     You are passing large blood clots from the vagina.     You start to feel sick to your stomach (nauseous) and throw up (vomit).  Document Released: 02/12/2005 Document Revised: 10/25/2010 Document Reviewed: 07/08/2008 [employer] Patient Information 2012 [employer]. 

## 2011-04-13 NOTE — Progress Notes (Signed)
  Subjective:     Emily [name] is an 36 y.o. woman who presents for irregular menses. She had been bleeding irregularly. She is now bleeding every 2-3 months and  menses are lasting spotting frequently days. She changes her pad or tampon every 2 hours. Clots are 1-2 cm in size. Dysmenorrhea:moderate, occurring first 1-2 days of flow.  Current contraception: condoms. History of infertility: no. History of abnormal Pap smear: no.  Pt desires to complete ultrasound prior to endo biopsy.  Pt has history of DUB with successful resolution with Depo Provera.  Insurance stopped last year and was unable to get it.  Last injection was in June of 2012.  Desires to use Depo Provera for DUB.    Menstrual History: OB History    Grav Para Term Preterm Abortions TAB SAB Ect Mult Living   2    1  1   1      Patient's last menstrual period was 02/26/2011.    The following portions of the patient's history were reviewed and updated as appropriate: allergies, current medications, past family history, past medical history, past social history, past surgical history and problem list.  Review of Systems Pertinent items are noted in HPI.    Objective:    BP 134/90  Pulse 85  Temp(Src) 98.1 F (36.7 C) (Oral)  Ht 5\' 4"  (1.626 m)  Wt 249 lb 9.6 oz (113.218 kg)  BMI 42.84 kg/m2  LMP 02/26/2011  General:   alert, cooperative and appears stated age  Skin:    normal  Neck:  no adenopathy, no carotid bruit, no JVD, supple, symmetrical, trachea midline and thyroid not enlarged, symmetric, no tenderness/mass/nodules  Abdomen:  soft, non-tender; bowel sounds normal; no masses,  no organomegaly  Pelvic:   cervix normal in appearance, external genitalia normal, no cervical motion tenderness, rectovaginal septum normal, vagina normal without discharge and uterus and adnexae difficult to palpate secondary to weight.    Urine pregnancy test - negative Assessment:    dysfunctional uterine bleeding    Plan:    Pap smear. Pelvic ultrasound. Depo Provera today.  Follow-up results in three weeks  [facility]

## 2011-04-13 NOTE — Progress Notes (Signed)
Addended by: Faythe Casa on: 04/13/2011 11:06 AM   Modules accepted: Orders

## 2011-04-23 ENCOUNTER — Ambulatory Visit (HOSPITAL_COMMUNITY)
Admission: RE | Admit: 2011-04-23 | Discharge: 2011-04-23 | Disposition: A | Discharge status: Home / Self Care | Payer: Self-pay | Source: Ambulatory Visit | Attending: Family | Admitting: Family

## 2011-04-23 DIAGNOSIS — N926 Irregular menstruation, unspecified: Secondary | ICD-10-CM

## 2011-04-23 DIAGNOSIS — N949 Unspecified condition associated with female genital organs and menstrual cycle: Secondary | ICD-10-CM | POA: Insufficient documentation

## 2011-04-23 DIAGNOSIS — N938 Other specified abnormal uterine and vaginal bleeding: Secondary | ICD-10-CM | POA: Insufficient documentation

## 2011-04-23 DIAGNOSIS — Z01812 Encounter for preprocedural laboratory examination: Secondary | ICD-10-CM

## 2011-05-04 ENCOUNTER — Encounter: Payer: Self-pay | Admitting: Obstetrics and Gynecology

## 2011-05-04 ENCOUNTER — Ambulatory Visit (INDEPENDENT_AMBULATORY_CARE_PROVIDER_SITE_OTHER): Payer: Self-pay | Admitting: Obstetrics and Gynecology

## 2011-05-04 VITALS — BP 132/87 | HR 86 | Temp 98.1°F | Ht 64.0 in | Wt 246.0 lb

## 2011-05-04 DIAGNOSIS — N920 Excessive and frequent menstruation with regular cycle: Secondary | ICD-10-CM

## 2011-05-04 DIAGNOSIS — N924 Excessive bleeding in the premenopausal period: Secondary | ICD-10-CM | POA: Insufficient documentation

## 2011-05-04 NOTE — Patient Instructions (Signed)
Return every 3 months for injection.

## 2011-05-04 NOTE — Progress Notes (Signed)
Reason for visit: results of Pap and pelvic ultrasound History  Menarche age 36 cycle intervals monthly and duration of flow 1 week. At about age 32 her cycle intervals became irregular and she was begun on oral contraceptives. She was dissatisfied with this due to cramps and difficulty remembering to take pills. She went off oral contraceptives and had menorrhagia episode before becoming pregnant. NSVD was at age 36. The following year she was seen in the ED for menorrhagia with several weeks of bleeding. She then received her first Depo-Provera shot and continued on Depo-Provera for 8 years. She was amenorrheic on Depo and very pleased with it. She discontinued Depo  07/28/2011. He was amenorrheic until 01/26/2011 when she bled fairly heavily for one and a half weeks. She next began bleeding 02/26/2011 bled for 3 weeks area was seen here 04/13/2011 and had a Pap that was normal she then had a pelvic ultrasound that was completely normal with stripe of 3 mm. Received Depo-Provera the next day.Since then she's had no bleeding.   Past Medical History  Diagnosis Date  . Obesity, unspecified   . PANIC DISORDER   . ANXIETY   . DEPRESSION   . DIABETES MELLITUS, TYPE II dx 04/2009  . GERD    Current Outpatient Prescriptions on File Prior to Visit  Medication Sig Dispense Refill  . Calcium Carbonate (CALCIUM 600 PO) Take by mouth 2 (two) times daily.        . citalopram (CELEXA) 20 MG tablet Take 20 mg by mouth daily.      . diclofenac (VOLTAREN) 75 MG EC tablet Take 1 tablet (75 mg total) by mouth 2 (two) times daily with a meal. X 1 week, then as needed for pain  20 tablet  0  . lisinopril (PRINIVIL,ZESTRIL) 10 MG tablet Take 1 tablet (10 mg total) by mouth daily.  30 tablet  6  . LORazepam (ATIVAN) 0.5 MG tablet Take 1 tablet (0.5 mg total) by mouth every 8 (eight) hours as needed.  30 tablet  1  . medroxyPROGESTERone (DEPO-PROVERA) 150 MG/ML injection Inject 150 mg into the muscle every 3 (three)  months.        . metFORMIN (GLUCOPHAGE) 1000 MG tablet Take 1 tablet (1,000 mg total) by mouth 2 (two) times daily with a meal.  60 tablet  6  . omeprazole (PRILOSEC) 20 MG capsule Take 1 capsule (20 mg total) by mouth daily.  30 capsule  6  . ONE TOUCH LANCETS MISC Please use as directed  200 each  1  . glipiZIDE (GLUCOTROL) 5 MG tablet Take 1 tablet (5 mg total) by mouth 2 (two) times daily before a meal.  62 tablet  5  . glucose blood (ONE TOUCH ULTRA TEST) test strip Use as instructed  100 each  6  . norgestimate-ethinyl estradiol (ORTHO-CYCLEN,SPRINTEC,PREVIFEM) 0.25-35 MG-MCG tablet Take 1 tablet by mouth daily.  1 Package  11   Current Facility-Administered Medications on File Prior to Visit  Medication Dose Route Frequency Provider Last Rate Last Dose  . pneumococcal 23 valent vaccine (PNU-IMMUNE) injection 0.5 mL  0.5 mL Intramuscular Once Rene Paci, MD         Physical exam  Deferred except: Gen: Obese, NAD Neck: Thyroid: smooth, ULNS  Assessment G2 P1 011  Premenopausal menorrhagia Morbid obesity II DM  Plan Results reviewed with patient. TSH obtained. Will continue on Depo-Provera. This is a 36 year old obese AA G2 P1 011 was seen here 04/13/2011 for abnormal bleeding and  received Depo-Provera here on 04/14/2011.

## 2011-05-05 LAB — TSH: TSH: 1.578 u[IU]/mL (ref 0.350–4.500)

## 2011-05-25 ENCOUNTER — Encounter (HOSPITAL_COMMUNITY): Payer: Self-pay | Admitting: Emergency Medicine

## 2011-05-25 ENCOUNTER — Emergency Department (HOSPITAL_COMMUNITY)
Admission: EM | Admit: 2011-05-25 | Discharge: 2011-05-25 | Disposition: A | Discharge status: Home / Self Care | Payer: Self-pay | Attending: Emergency Medicine | Admitting: Emergency Medicine

## 2011-05-25 DIAGNOSIS — H6090 Unspecified otitis externa, unspecified ear: Secondary | ICD-10-CM

## 2011-05-25 DIAGNOSIS — K219 Gastro-esophageal reflux disease without esophagitis: Secondary | ICD-10-CM | POA: Insufficient documentation

## 2011-05-25 DIAGNOSIS — F329 Major depressive disorder, single episode, unspecified: Secondary | ICD-10-CM | POA: Insufficient documentation

## 2011-05-25 DIAGNOSIS — H6690 Otitis media, unspecified, unspecified ear: Secondary | ICD-10-CM | POA: Insufficient documentation

## 2011-05-25 DIAGNOSIS — E119 Type 2 diabetes mellitus without complications: Secondary | ICD-10-CM | POA: Insufficient documentation

## 2011-05-25 DIAGNOSIS — F3289 Other specified depressive episodes: Secondary | ICD-10-CM | POA: Insufficient documentation

## 2011-05-25 DIAGNOSIS — E669 Obesity, unspecified: Secondary | ICD-10-CM | POA: Insufficient documentation

## 2011-05-25 DIAGNOSIS — F410 Panic disorder [episodic paroxysmal anxiety] without agoraphobia: Secondary | ICD-10-CM | POA: Insufficient documentation

## 2011-05-25 MED ORDER — NEOMYCIN-POLYMYXIN-HC 3.5-10000-1 OT SUSP: 4.0000 [drp] | Freq: Four times a day (QID) | OTIC | Status: AC

## 2011-05-25 NOTE — ED Provider Notes (Signed)
History     CSN: [id number]  Arrival date & time 05/25/11  0745   First MD Initiated Contact with Patient 05/25/11 0801      Chief Complaint  Patient presents with  . Otitis Media    (Consider location/radiation/quality/duration/timing/severity/associated sxs/prior treatment) Patient is a 36 y.o. female presenting with ear pain. The history is provided by the patient.  Otalgia This is a new problem. The current episode started 2 days ago. There is pain in the left ear. The problem occurs constantly. The problem has been gradually worsening. There has been no fever. The pain is moderate. Pertinent negatives include no ear discharge and no hearing loss.    Past Medical History  Diagnosis Date  . Obesity, unspecified   . PANIC DISORDER   . ANXIETY   . DEPRESSION   . DIABETES MELLITUS, TYPE II dx 04/2009  . GERD     Past Surgical History  Procedure Date  . No past surgeries   . Spine surgery 07/20/2010    Family History  Problem Relation Age of Onset  . Sarcoidosis Mother 71  . Asthma Mother   . Hyperlipidemia Mother   . Hypertension Mother   . Coronary artery disease Father     Had CABG, and angioplasty around age 40  . Diabetes Father   . Pulmonary fibrosis Sister 30  . Asthma Sister     History  Substance Use Topics  . Smoking status: Never Smoker   . Smokeless tobacco: Not on file   Comment: Has 55 yt old son Dorma Russell who lives with her. (Autism & ADD) Work at [employer]  . Alcohol Use: No    OB History    Grav Para Term Preterm Abortions TAB SAB Ect Mult Living   2    1  1   1       Review of Systems  HENT: Positive for ear pain. Negative for hearing loss and ear discharge.   All other systems reviewed and are negative.    Allergies  Ambien  Home Medications   Current Outpatient Rx  Name Route Sig Dispense Refill  . CALCIUM 600 PO Oral Take by mouth 2 (two) times daily.      Marland Kitchen CITALOPRAM HYDROBROMIDE 20 MG PO TABS Oral Take 20 mg by  mouth daily.    Marland Kitchen DICLOFENAC SODIUM 75 MG PO TBEC Oral Take 1 tablet (75 mg total) by mouth 2 (two) times daily with a meal. X 1 week, then as needed for pain 20 tablet 0  . GLIPIZIDE 5 MG PO TABS Oral Take 1 tablet (5 mg total) by mouth 2 (two) times daily before a meal. 62 tablet 5  . GLUCOSE BLOOD VI STRP  Use as instructed 100 each 6  . LISINOPRIL 10 MG PO TABS Oral Take 1 tablet (10 mg total) by mouth daily. 30 tablet 6  . LORAZEPAM 0.5 MG PO TABS Oral Take 1 tablet (0.5 mg total) by mouth every 8 (eight) hours as needed. 30 tablet 1  . MEDROXYPROGESTERONE ACETATE 150 MG/ML IM SUSP Intramuscular Inject 150 mg into the muscle every 3 (three) months.      . METFORMIN HCL 1000 MG PO TABS Oral Take 1 tablet (1,000 mg total) by mouth 2 (two) times daily with a meal. 60 tablet 6  . NORGESTIMATE-ETH ESTRADIOL 0.25-35 MG-MCG PO TABS Oral Take 1 tablet by mouth daily. 1 Package 11  . OMEPRAZOLE 20 MG PO CPDR Oral Take 1 capsule (20 mg  total) by mouth daily. 30 capsule 6  . ONETOUCH LANCETS MISC  Please use as directed 200 each 1    BP 144/81  Pulse 101  Temp(Src) 98.7 F (37.1 C) (Oral)  Resp 20  SpO2 100%  LMP 03/26/2011  Physical Exam  Nursing note and vitals reviewed. Constitutional: She is oriented to person, place, and time. She appears well-developed and well-nourished. No distress.  HENT:  Head: Normocephalic and atraumatic.       The left tm looks okay.  There is no erythema or bulging.  The ear canal is inflamed with drainage.  There is mild pain with manipulation and speculum insertion.  Neck: Normal range of motion. Neck supple.  Cardiovascular: Normal rate and regular rhythm.   No murmur heard. Pulmonary/Chest: Effort normal and breath sounds normal. She has no wheezes. She has no rales.  Lymphadenopathy:    She has no cervical adenopathy.  Neurological: She is alert and oriented to person, place, and time.  Skin: Skin is warm and dry. She is not diaphoretic.    ED Course   Procedures (including critical care time)  Labs Reviewed - No data to display No results found.   No diagnosis found.    MDM          Geoffery Lyons, MD 05/25/11 [id number]

## 2011-05-25 NOTE — ED Notes (Signed)
Per pt, ear discomfort, worsening symptoms-doesn't know if allergies and/or sinus infection

## 2011-05-25 NOTE — Discharge Instructions (Signed)
Otitis Externa  Otitis externa ("swimmer's ear") is a germ (bacterial) or fungal infection of the outer ear canal (from the eardrum to the outside of the ear). Swimming in dirty water may cause swimmer's ear. It also may be caused by moisture in the ear from water remaining after swimming or bathing. Often the first signs of infection may be itching in the ear canal. This may progress to ear canal swelling, redness, and pus drainage, which may be signs of infection.  HOME CARE INSTRUCTIONS    Apply the antibiotic drops to the ear canal as prescribed by your doctor.   This can be a very painful medical condition. A strong pain reliever may be prescribed.   Only take over-the-counter or prescription medicines for pain, discomfort, or fever as directed by your caregiver.   If your caregiver has given you a follow-up appointment, it is very important to keep that appointment. Not keeping the appointment could result in a chronic or permanent injury, pain, hearing loss and disability. If there is any problem keeping the appointment, you must call back to this facility for assistance.  PREVENTION    It is important to keep your ear dry. Use the corner of a towel to wick water out of the ear canal after swimming or bathing.   Avoid scratching in your ear. This can damage the ear canal or remove the protective wax lining the canal and make it easier for germs (bacteria) or a fungus to grow.   You may use ear drops made of rubbing alcohol and vinegar after swimming to prevent future "swimmer's ear" infections. Make up a small bottle of equal parts white vinegar and alcohol. Put 3 or 4 drops into each ear after swimming.   Avoid swimming in lakes, polluted water, or poorly chlorinated pools.  SEEK MEDICAL CARE IF:    An oral temperature above 102 F (38.9 C) develops.   Your ear is still painful after 3 days and shows signs of getting worse (redness, swelling, pain, or pus).  MAKE SURE YOU:    Understand these  instructions.   Will watch your condition.   Will get help right away if you are not doing well or get worse.  Document Released: 02/12/2005 Document Revised: 02/01/2011 Document Reviewed: 09/19/2007  [employer] Patient Information 2012 [employer].

## 2011-06-04 ENCOUNTER — Encounter: Payer: Self-pay | Admitting: Internal Medicine

## 2011-06-11 ENCOUNTER — Ambulatory Visit (INDEPENDENT_AMBULATORY_CARE_PROVIDER_SITE_OTHER): Payer: Self-pay | Admitting: Internal Medicine

## 2011-06-11 ENCOUNTER — Encounter: Payer: Self-pay | Admitting: Internal Medicine

## 2011-06-11 VITALS — BP 134/95 | HR 96 | Temp 99.3°F | Ht 64.0 in | Wt 241.1 lb

## 2011-06-11 DIAGNOSIS — E119 Type 2 diabetes mellitus without complications: Secondary | ICD-10-CM

## 2011-06-11 DIAGNOSIS — N926 Irregular menstruation, unspecified: Secondary | ICD-10-CM

## 2011-06-11 DIAGNOSIS — N939 Abnormal uterine and vaginal bleeding, unspecified: Secondary | ICD-10-CM

## 2011-06-11 DIAGNOSIS — Z79899 Other long term (current) drug therapy: Secondary | ICD-10-CM

## 2011-06-11 DIAGNOSIS — N938 Other specified abnormal uterine and vaginal bleeding: Secondary | ICD-10-CM

## 2011-06-11 NOTE — Assessment & Plan Note (Signed)
Bleeding stopped with Depoprovera but has not recurred.  Uterine ultrasound and PAP/pelvic exam were normal.  Per patient, OBGyn did not feel uterine biopsy was indicated based on these results.  [facility]  Told her to mention to them at that time if she is still bleeding.  No fu with OBGyn physician scheduled.  At follow-up with [facility] in 3-4 weeks for her diabetes, if uterine bleeding continues despite Depo-Provera she likely will need to get another appt with OBGyn for further evaluation.

## 2011-06-11 NOTE — Progress Notes (Signed)
Subjective:   Patient ID: Emily [name] female   DOB: 24-Feb-1976 36 y.o.   MRN: [id number]  HPI: [name] is a 36 y.o. with DM, depression, dyfunctional uterine bleeding here for follow-up.  She reports that her diet has not been very good in the last couple months.  Had been drinking a lot of sugar fruit juices (LOVES mango pineapple juice) and a lot of starches.  Stopped the fruit juices after one sugar of 413 on Easter weekend.  Now trying diet sodas and unsweetened tea with splenda in place of the fruit juices.  However, she does report eating a lot today.  She is taking Metformin and Glipizide BID as directed.  No CP, SOB, or headaches,  No abd pain.    She got Depo provera shot at [facility] for her DUB in February.  This stopped her bleeding until April 1st.  Since April 1st she has had scant but continuous bleeding.    She reports that her mood is doing very well and she has a lot of exciting things going on in her life (almost done school, about to move which is exciting for her).      Past Medical History  Diagnosis Date  . Obesity, unspecified   . PANIC DISORDER   . ANXIETY   . DEPRESSION   . DIABETES MELLITUS, TYPE II dx 04/2009  . GERD    Current Outpatient Prescriptions  Medication Sig Dispense Refill  . Calcium Carbonate (CALCIUM 600 PO) Take by mouth 2 (two) times daily.        . citalopram (CELEXA) 20 MG tablet Take 20 mg by mouth daily.      . diclofenac (VOLTAREN) 75 MG EC tablet Take 1 tablet (75 mg total) by mouth 2 (two) times daily with a meal. X 1 week, then as needed for pain  20 tablet  0  . glipiZIDE (GLUCOTROL) 5 MG tablet Take 1 tablet (5 mg total) by mouth 2 (two) times daily before a meal.  62 tablet  5  . glucose blood (ONE TOUCH ULTRA TEST) test strip Use as instructed  100 each  6  . lisinopril (PRINIVIL,ZESTRIL) 10 MG tablet Take 1 tablet (10 mg total) by mouth daily.  30 tablet  6  . LORazepam (ATIVAN) 0.5 MG tablet Take 1 tablet  (0.5 mg total) by mouth every 8 (eight) hours as needed.  30 tablet  1  . medroxyPROGESTERone (DEPO-PROVERA) 150 MG/ML injection Inject 150 mg into the muscle every 3 (three) months.        . metFORMIN (GLUCOPHAGE) 1000 MG tablet Take 1 tablet (1,000 mg total) by mouth 2 (two) times daily with a meal.  60 tablet  6  . norgestimate-ethinyl estradiol (ORTHO-CYCLEN,SPRINTEC,PREVIFEM) 0.25-35 MG-MCG tablet Take 1 tablet by mouth daily.  1 Package  11  . omeprazole (PRILOSEC) 20 MG capsule Take 1 capsule (20 mg total) by mouth daily.  30 capsule  6  . ONE TOUCH LANCETS MISC Please use as directed  200 each  1   Current Facility-Administered Medications  Medication Dose Route Frequency Provider Last Rate Last Dose  . pneumococcal 23 valent vaccine (PNU-IMMUNE) injection 0.5 mL  0.5 mL Intramuscular Once Newt Lukes, MD       Family History  Problem Relation Age of Onset  . Sarcoidosis Mother 3  . Asthma Mother   . Hyperlipidemia Mother   . Hypertension Mother   . Coronary artery disease Father     Had  CABG, and angioplasty around age 68  . Diabetes Father   . Pulmonary fibrosis Sister 30  . Asthma Sister    History   Social History  . Marital Status: Legally Separated    Spouse Name: N/A    Number of Children: N/A  . Years of Education: N/A   Social History Main Topics  . Smoking status: Never Smoker   . Smokeless tobacco: None   Comment: Has 39 yt old son Dorma Russell who lives with her. (Autism & ADD) Work at [employer]  . Alcohol Use: No  . Drug Use: No  . Sexually Active: Yes    Birth Control/ Protection: Condom   Other Topics Concern  . None   Social History Narrative  . None   Review of Systems: Constitutional: Denies fever, chills, diaphoresis, appetite change and fatigue.   Respiratory: Denies SOB, DOE, cough, chest tightness,  and wheezing.   Cardiovascular: Denies chest pain, palpitations and leg swelling.  Gastrointestinal: Denies nausea, vomiting,  abdominal pain, diarrhea, constipation, blood in stool and abdominal distention.  Genitourinary: Denies dysuria, urgency, frequency, hematuria, flank pain and difficulty urinating.     Objective:  Physical Exam: Filed Vitals:   06/11/11 1350  BP: 134/95  Pulse: 96  Temp: 99.3 F (37.4 C)  TempSrc: Oral  Height: 5\' 4"  (1.626 m)  Weight: 241 lb 1.6 oz (109.362 kg)   Constitutional: Vital signs reviewed.  Patient is an obese female in no acute distress and cooperative with exam. Alert and oriented x3.  Head: Normocephalic and atraumatic Mouth: no erythema or exudates, MMM Eyes: PERRL, EOMI, conjunctivae normal, No scleral icterus.  Neck: No JVD  Cardiovascular: RRR, S1 normal, S2 normal, no MRG, pulses symmetric and intact bilaterally Pulmonary/Chest: CTAB, no wheezes, rales, or rhonchi  Neurological: A&O x3, Strength is normal and symmetric bilaterally, cranial nerve II-XII are grossly intact, no focal motor deficit, sensory intact to light touch bilaterally.  Skin: Warm, dry and intact. No rash, cyanosis, or clubbing.    Assessment & Plan:

## 2011-06-11 NOTE — Patient Instructions (Signed)
Please see diabetes educator Lupita Leash Plyler within 1 week.   For three days prior to seeing Ms. Plyler, check your blood sugar 4x per day (before breakfast, before dinner, 2 hrs after breakfast, 2 hrs after dinner) and bring all these readings and your meter to your visit with her.   Please return to clinic in 3-4 weeks for diabetes check

## 2011-06-11 NOTE — Assessment & Plan Note (Signed)
POC HbA1c today 11.4% is greatly elevated from her last 7.8% three months ago.  Seems that her diet is definitely contributing to poor glucose control, but she is compliant with her meds, and glipizide was actually increased last time.  She reports sugars at home usually 170s, with one of 413 but rest under 300 and none below 125.  She did not bring in her meter today, which is a Training and development officer. I suspect that this new HbA1c 11.4% is a real value due to her poor diet.  If so, she likely will need to be started on at least a long acting insulin.  However, she is reluctant to do so right now.    -Will confirm HbA1c with [facility] HbA1c -fu with diabetes educator in 1 week, for 3 days prior check sugar before and after breakfast and dinner (4x total) and bring all readings and meter to appt with Norm Parcel -Consider starting Lantus based on discussion with diabetes educator.  Alternatively, since her diet is quite bad, she could make huge improvements with diet change alone if she is motivated.  Today she agreed to stop drinking juices and to instead drink diet sodas and unsweetened tea with Splenda.   -Continue Metformin and Glipizide for now -Eye exam referral

## 2011-06-12 ENCOUNTER — Telehealth: Payer: Self-pay | Admitting: Dietician

## 2011-06-12 LAB — HEMOGLOBIN A1C
Hgb A1c MFr Bld: 10.6 % — ABNORMAL HIGH (ref <5.7)
Mean Plasma Glucose: 258 mg/dL — ABNORMAL HIGH (ref <117)

## 2011-06-12 NOTE — Telephone Encounter (Signed)
Had conversation with patient when she called back to provide support. Patient requests simple, low budget assistance with meal planning.

## 2011-06-12 NOTE — Telephone Encounter (Signed)
Offered support and confirmed upcoming appointment in 06/14/11.

## 2011-06-14 ENCOUNTER — Ambulatory Visit (INDEPENDENT_AMBULATORY_CARE_PROVIDER_SITE_OTHER): Payer: Self-pay | Admitting: Dietician

## 2011-06-14 VITALS — BP 122/78 | HR 105 | Ht 64.0 in | Wt 238.1 lb

## 2011-06-14 DIAGNOSIS — E119 Type 2 diabetes mellitus without complications: Secondary | ICD-10-CM

## 2011-06-14 LAB — GLUCOSE, CAPILLARY: Glucose-Capillary: 156 mg/dL — ABNORMAL HIGH (ref 70–99)

## 2011-06-14 NOTE — Progress Notes (Signed)
Patient has symptoms of low blood sugar with CBG of 156. (130 fasting today) Treated with orange juice and patient feeling better. Appointment rescheduled.

## 2011-06-15 ENCOUNTER — Ambulatory Visit (INDEPENDENT_AMBULATORY_CARE_PROVIDER_SITE_OTHER): Payer: Self-pay | Admitting: Obstetrics and Gynecology

## 2011-06-15 ENCOUNTER — Encounter: Payer: Self-pay | Admitting: Obstetrics and Gynecology

## 2011-06-15 VITALS — BP 125/87 | HR 88 | Temp 97.7°F | Ht 63.0 in | Wt 235.3 lb

## 2011-06-15 DIAGNOSIS — N938 Other specified abnormal uterine and vaginal bleeding: Secondary | ICD-10-CM

## 2011-06-15 DIAGNOSIS — N926 Irregular menstruation, unspecified: Secondary | ICD-10-CM

## 2011-06-15 NOTE — Progress Notes (Signed)
Pt states bleeding since April 1 but not heavy. Changing pads 3 to 4 times a day. Next depo due May 3. Received Depo Feb 15.

## 2011-06-15 NOTE — Progress Notes (Signed)
  Subjective:    Patient ID: Emily [name], female    DOB: December 08, 1975, 36 y.o.   MRN: [id number]  HPI Follow-up for dysfunctional uterine bleeding. The patient has had a history of irregular, heavy periods several years ago and was put on Depo-Provera, which caused cessation of her dysmenorrhea and which she used for 8 years. In June 2012, the patient stopped the Depo due to losing her insurance. Her periods resumed end of November 2012. When she had her second period in December, it lasted for 3 weeks, and since then, her monthly periods have been prolonged. She has a period currently, which started May 28, 2011.  Pelvic/transvaginal ultrasound 02/15 showed no fibroids, 3mm endometrial stripe.  The patient denies heavy bleeding (she changes pads about 3-4 times daily) or pelvic/abdominal pain.  04/2011 TSH was WNL and 02/2011 Hgb was 14.0.   Review of Systems Complains of feeling tired, but denies lightheadedness.     Objective:   Physical Exam Gen: NAD; accompanied by mother and husband    Assessment & Plan:  This is a 36 year old African-American obese female with a history of non-insulin-dependent diabetes on glipizide and metformin with a history of metomenorrhagia in the past that had been successfully managed with Depo-Provera who now presents with prolonged periods after stopping her Depo-Provera for about 7 months and who has been restarted on this medication 2 months ago.   Discussed with patient how irregular bleeding is common after initially starting Depo-Provera but should improve within the next several months. She is due for her next Depo-Provera 05/03. Will monitor bleeding for now.  Patient declined birth control medications at this time. She reports being unable to take it daily.  Will check urine GC/Chlamydia today.  Follow-up for Depo-Provera next month and then as needed if irregular bleeding persists past 6 months of starting Depo or symptoms worsen.

## 2011-06-16 LAB — GC/CHLAMYDIA PROBE AMP, URINE: GC Probe Amp, Urine: NEGATIVE

## 2011-06-18 ENCOUNTER — Encounter: Payer: Self-pay | Admitting: Dietician

## 2011-06-21 ENCOUNTER — Encounter: Payer: Self-pay | Admitting: Dietician

## 2011-06-21 ENCOUNTER — Ambulatory Visit (INDEPENDENT_AMBULATORY_CARE_PROVIDER_SITE_OTHER): Payer: Self-pay | Admitting: Dietician

## 2011-06-21 VITALS — Ht 63.0 in | Wt 242.5 lb

## 2011-06-21 DIAGNOSIS — E119 Type 2 diabetes mellitus without complications: Secondary | ICD-10-CM

## 2011-06-21 NOTE — Progress Notes (Signed)
Diabetes Self-Management Training (DSMT)  Initial Visit  06/21/2011 [name], identified by name and date of birth, is a 36 y.o. female with Type 2 Diabetes. Other persons present: no  ASSESSMENT Patient concerns are Nutrition/meal planning, Monitoring, Problem solving, Glycemic control and Weight control.  Height 5\' 3"  (1.6 m), weight 242 lb 8 oz (109.997 kg), last menstrual period 05/28/2011. Body mass index is 42.96 kg/(m^2). Lab Results  Component Value Date   LDLCALC 112* 05/20/2009   Lab Results  Component Value Date   HGBA1C 10.6* 06/11/2011    Labs and snapshot reveiwed. Family history of diabetes: Yes Support systems: friends and family- son and sister Special needs: None Prior DM Education: No Patients belief/attitude about diabetes: Diabetes can be controlled. Self foot exams daily: No Diabetes Complications: None   Medications See Medications list.  Is interested in learning more   Exercise Plan Doing ADLs and sendentary work for 60 minutesa day.   Self-Monitoring Frequency of testing: 3-4 times/day Breakfast: 130-226, most in 170s After breakfast:150-170 Supper: 113-227, most in 180s Bedtime:160-170  Hyperglycemia: No Hypoglycemia: No   Meal Planning Limited knowledge   Assessment comments: Weight increased from last week. Patient has been on carb diet in past which worked, but stopped limiting carbs and did not know relationship of carbs to blood sugars. Usual eating pattern includes 3 meals and 3 snacks per day. Reports lack of self confidence in cooking skills.  Avoided foods include regular sodas, sweets.  Her primary interest today was in physical activity Usual physical activity includes ~12/15 hours sedentary activity/day  NB-1.1 Food and nutrition-related knowledge deficit As related to lack of prior exposure to diabetes meal planning.  As evidenced by her report. NB-2.1 Physical inactivity As related to excess sedentary time.  As  evidenced by her report and insulin resistance.     INDIVIDUAL DIABETES EDUCATION PLAN:  Nutrition management Physical activity and exercise Medication Monitoring Acute complication: Goal setting _______________________________________________________________________  Intervention TOPICS COVERED TODAY:  Nutrition management  Role of diet in the treatment of diabetes and the relationship between the three main macronutritents and blood glucose control. Physical activity and exercise  Role of exercise on diabetes management, blood pressure control and cardiac health. Monitoring  Identified appropriate SMBG and A1C goals. Acute complication  Taught treatment of hypoglycemia - the 15 rule. Goal setting  Helped patient develop diabetes management plan for increasing daily physcial activity  PATIENTS GOALS/PLAN (copy and paste in patient instructions so patient receives a copy): 1.  Learning Objective:        state importance of carbs to blood sugar control, goal setting using smart acronym, target   blood sugars and how to prevent, recognition and treat low  blood sugars.  2.  Behavioral Objective:         Physical Activity: For improved blood glucose control and decrease insulin resistance, I will exercise 15 minutes 3 days a week   Never 0%  Personalized Follow-Up Plan for Ongoing Self Management Support:  Doctor's Office, church, friends, family and CDE visits ______________________________________________________________________   Outcomes Expected outcomes: Demonstrated interest in learning.Expect positive changes in lifestyle. Self-care Barriers: Lack of material resources Education material provided: yes Patient to contact team via Phone if problems or questions. Time in: 0830     Time out: 0930 Future DSMT - 2 wks   Emily [name], Emily [name]

## 2011-06-21 NOTE — Patient Instructions (Signed)
We'll follow up on your goal at 07/09/11 appointment.  Please call with questions.  Use the book and conversation to learn about what is in foods and share what we talked about.  See you soon!

## 2011-06-29 ENCOUNTER — Ambulatory Visit (INDEPENDENT_AMBULATORY_CARE_PROVIDER_SITE_OTHER): Payer: Self-pay | Admitting: *Deleted

## 2011-06-29 VITALS — BP 134/83 | HR 74 | Temp 98.6°F | Ht 64.0 in | Wt 238.6 lb

## 2011-06-29 DIAGNOSIS — N938 Other specified abnormal uterine and vaginal bleeding: Secondary | ICD-10-CM

## 2011-06-29 DIAGNOSIS — N926 Irregular menstruation, unspecified: Secondary | ICD-10-CM

## 2011-06-29 MED ORDER — MEDROXYPROGESTERONE ACETATE 150 MG/ML IM SUSP
150.0000 mg | Freq: Once | INTRAMUSCULAR | Status: AC
  Administered 2011-06-29: 150 mg via INTRAMUSCULAR

## 2011-07-06 ENCOUNTER — Emergency Department (HOSPITAL_COMMUNITY)
Admission: EM | Admit: 2011-07-06 | Discharge: 2011-07-06 | Disposition: A | Discharge status: Home / Self Care | Payer: Self-pay | Attending: Emergency Medicine | Admitting: Emergency Medicine

## 2011-07-06 ENCOUNTER — Encounter (HOSPITAL_COMMUNITY): Payer: Self-pay | Admitting: *Deleted

## 2011-07-06 DIAGNOSIS — Z79899 Other long term (current) drug therapy: Secondary | ICD-10-CM | POA: Insufficient documentation

## 2011-07-06 DIAGNOSIS — E119 Type 2 diabetes mellitus without complications: Secondary | ICD-10-CM | POA: Insufficient documentation

## 2011-07-06 DIAGNOSIS — F341 Dysthymic disorder: Secondary | ICD-10-CM | POA: Insufficient documentation

## 2011-07-06 DIAGNOSIS — J40 Bronchitis, not specified as acute or chronic: Secondary | ICD-10-CM | POA: Insufficient documentation

## 2011-07-06 MED ORDER — ALBUTEROL SULFATE HFA 108 (90 BASE) MCG/ACT IN AERS: 1.0000 | INHALATION_SPRAY | Freq: Four times a day (QID) | RESPIRATORY_TRACT | Status: DC | PRN

## 2011-07-06 MED ORDER — HYDROCOD POLST-CHLORPHEN POLST 10-8 MG/5ML PO LQCR: 5.0000 mL | Freq: Two times a day (BID) | ORAL | Status: DC

## 2011-07-06 NOTE — ED Provider Notes (Signed)
History     CSN: [id number]  Arrival date & time 07/06/11  1478   First MD Initiated Contact with Patient 07/06/11 0802      Chief Complaint  Patient presents with  . Cough  . URI    (Consider location/radiation/quality/duration/timing/severity/associated sxs/prior treatment) HPI  Patient presents to emergency department complaining of a one to two-week history of intermittent cough, runny nose and itchy eyes, and sinus pressure. Patient states that she does have problems with seasonal allergies and feels like in the spring her cough and congestion worsens. Patient states she's been taking over-the-counter cough syrup without relief. Patient states her cough is worse in the evening time. Patient denies any fevers, chills, chest pain, wheezing, abdominal pain, nausea, vomiting. She states that she started taking Zyrtec yesterday. She states that she has a followup plan with her primary care Dr. on Monday, 3 days from now. She denies aggravating or alleviating factors.   Past Medical History  Diagnosis Date  . Obesity, unspecified   . PANIC DISORDER   . ANXIETY   . DEPRESSION   . DIABETES MELLITUS, TYPE II dx 04/2009  . GERD     Past Surgical History  Procedure Date  . No past surgeries   . Spine surgery 07/20/2010    Family History  Problem Relation Age of Onset  . Sarcoidosis Mother 95  . Asthma Mother   . Hyperlipidemia Mother   . Hypertension Mother   . Coronary artery disease Father     Had CABG, and angioplasty around age 60  . Diabetes Father   . Pulmonary fibrosis Sister 30  . Asthma Sister     History  Substance Use Topics  . Smoking status: Never Smoker   . Smokeless tobacco: Not on file   Comment: Has 23 yt old son Dorma Russell who lives with her. (Autism & ADD) Work at [employer]  . Alcohol Use: No    OB History    Grav Para Term Preterm Abortions TAB SAB Ect Mult Living   2    1  1   1       Review of Systems  All other systems reviewed and  are negative.    Allergies  Zolpidem tartrate  Home Medications   Current Outpatient Rx  Name Route Sig Dispense Refill  . CALCIUM 600 PO Oral Take by mouth 2 (two) times daily.      Marland Kitchen CETIRIZINE HCL 10 MG PO TABS Oral Take 10 mg by mouth daily.    Marland Kitchen CITALOPRAM HYDROBROMIDE 20 MG PO TABS Oral Take 20 mg by mouth daily.    Marland Kitchen GLIPIZIDE 5 MG PO TABS Oral Take 1 tablet (5 mg total) by mouth 2 (two) times daily before a meal. 62 tablet 5  . LISINOPRIL 10 MG PO TABS Oral Take 1 tablet (10 mg total) by mouth daily. 30 tablet 6  . LORAZEPAM 0.5 MG PO TABS Oral Take 1 tablet (0.5 mg total) by mouth every 8 (eight) hours as needed. 30 tablet 1  . MEDROXYPROGESTERONE ACETATE 150 MG/ML IM SUSP Intramuscular Inject 150 mg into the muscle every 3 (three) months.      . METFORMIN HCL 1000 MG PO TABS Oral Take 1 tablet (1,000 mg total) by mouth 2 (two) times daily with a meal. 60 tablet 6  . OMEPRAZOLE 20 MG PO CPDR Oral Take 1 capsule (20 mg total) by mouth daily. 30 capsule 6  . ALBUTEROL SULFATE HFA 108 (90 BASE) MCG/ACT IN  AERS Inhalation Inhale 1-2 puffs into the lungs every 6 (six) hours as needed for wheezing. 1 Inhaler 0  . GLUCOSE BLOOD VI STRP  Use as instructed 100 each 6  . ONETOUCH LANCETS MISC  Please use as directed 200 each 1    BP 126/77  Pulse 97  Temp(Src) 98.2 F (36.8 C) (Oral)  Resp 20  Wt 238 lb (107.956 kg)  SpO2 100%  LMP 05/28/2011  Physical Exam  Vitals reviewed. Constitutional: She is oriented to person, place, and time. She appears well-developed and well-nourished. No distress.  HENT:  Head: Normocephalic and atraumatic.  Right Ear: External ear normal.  Left Ear: External ear normal.  Nose: Nose normal.  Mouth/Throat: No oropharyngeal exudate.       Mild erythema of posterior pharynx and tonsils no tonsillar exudate or enlargement. Patent airway. Swallowing secretions well  Eyes: Conjunctivae and EOM are normal. Pupils are equal, round, and reactive to  light.  Neck: Normal range of motion. Neck supple.  Cardiovascular: Normal rate, regular rhythm and normal heart sounds.  Exam reveals no gallop and no friction rub.   No murmur heard. Pulmonary/Chest: Effort normal and breath sounds normal. No respiratory distress. She has no wheezes. She has no rales. She exhibits no tenderness.  Lymphadenopathy:    She has no cervical adenopathy.  Neurological: She is alert and oriented to person, place, and time. She has normal reflexes.  Skin: Skin is warm and dry. No rash noted. She is not diaphoretic.  Psychiatric: She has a normal mood and affect.    ED Course  Procedures (including critical care time)  Labs Reviewed - No data to display No results found.   1. Bronchitis       MDM  Patient with complaints of seasonal allergies and cough. Afebrile and non toxic appearing. Normal lung exam with pulse ox 100% on room air. Likely bronchitis with allergy component. Patient has appt with PCP on Monday, 3 days from now.         Jenness Corner, [place] 07/06/11 [phone]

## 2011-07-06 NOTE — ED Notes (Signed)
Mother states "I know I have allergies, it comes & goes, on some days it's like I can breathe, I'm having a good day, I have sinus pressure".

## 2011-07-06 NOTE — Discharge Instructions (Signed)
Use inhaler as directed for cough and use daily zyrtec or allegra. You may consider benadryl at night for additional symptomatic improvement. Follow up with your primary care provider on Monday for recheck of ongoing symptoms but return to ER for emergent changing or worsening of symptoms.   Bronchitis Bronchitis is the body's way of reacting to injury and/or infection (inflammation) of the bronchi. Bronchi are the air tubes that extend from the windpipe into the lungs. If the inflammation becomes severe, it may cause shortness of breath. CAUSES  Inflammation may be caused by:  A virus.   Germs (bacteria).   Dust.   Allergens.   Pollutants and many other irritants.  The cells lining the bronchial tree are covered with tiny hairs (cilia). These constantly beat upward, away from the lungs, toward the mouth. This keeps the lungs free of pollutants. When these cells become too irritated and are unable to do their job, mucus begins to develop. This causes the characteristic cough of bronchitis. The cough clears the lungs when the cilia are unable to do their job. Without either of these protective mechanisms, the mucus would settle in the lungs. Then you would develop pneumonia. Smoking is a common cause of bronchitis and can contribute to pneumonia. Stopping this habit is the single most important thing you can do to help yourself. TREATMENT   Your caregiver may prescribe an antibiotic if the cough is caused by bacteria. Also, medicines that open up your airways make it easier to breathe. Your caregiver may also recommend or prescribe an expectorant. It will loosen the mucus to be coughed up. Only take over-the-counter or prescription medicines for pain, discomfort, or fever as directed by your caregiver.   Removing whatever causes the problem (smoking, for example) is critical to preventing the problem from getting worse.   Cough suppressants may be prescribed for relief of cough symptoms.    Inhaled medicines may be prescribed to help with symptoms now and to help prevent problems from returning.   For those with recurrent (chronic) bronchitis, there may be a need for steroid medicines.  SEEK IMMEDIATE MEDICAL CARE IF:   During treatment, you develop more pus-like mucus (purulent sputum).   You have a fever.   Your baby is older than 3 months with a rectal temperature of 102 F (38.9 C) or higher.   Your baby is 20 months old or younger with a rectal temperature of 100.4 F (38 C) or higher.   You become progressively more ill.   You have increased difficulty breathing, wheezing, or shortness of breath.  It is necessary to seek immediate medical care if you are elderly or sick from any other disease. MAKE SURE YOU:   Understand these instructions.   Will watch your condition.   Will get help right away if you are not doing well or get worse.  Document Released: 02/12/2005 Document Revised: 02/01/2011 Document Reviewed: 12/23/2007 [facility] Patient Information 2012 [place], [place].

## 2011-07-09 ENCOUNTER — Encounter: Payer: Self-pay | Admitting: Internal Medicine

## 2011-07-09 ENCOUNTER — Ambulatory Visit: Payer: Self-pay | Admitting: Dietician

## 2011-07-09 NOTE — ED Provider Notes (Signed)
Medical screening examination/treatment/procedure(s) were performed by non-physician practitioner and as supervising physician I was immediately available for consultation/collaboration.  Haydon Kalmar, MD 07/09/11 0717 

## 2011-07-13 ENCOUNTER — Telehealth: Payer: Self-pay | Admitting: Dietician

## 2011-07-16 NOTE — Telephone Encounter (Signed)
Patient called to reschedule. Returned call -Note appointment has been rescheduled.

## 2011-07-31 ENCOUNTER — Encounter: Payer: Self-pay | Admitting: Internal Medicine

## 2011-07-31 ENCOUNTER — Ambulatory Visit (INDEPENDENT_AMBULATORY_CARE_PROVIDER_SITE_OTHER): Payer: Self-pay | Admitting: Dietician

## 2011-07-31 ENCOUNTER — Ambulatory Visit (INDEPENDENT_AMBULATORY_CARE_PROVIDER_SITE_OTHER): Payer: Self-pay | Admitting: Internal Medicine

## 2011-07-31 VITALS — BP 124/81 | HR 82 | Temp 96.7°F | Ht 64.0 in | Wt 238.2 lb

## 2011-07-31 DIAGNOSIS — Z79899 Other long term (current) drug therapy: Secondary | ICD-10-CM

## 2011-07-31 DIAGNOSIS — E119 Type 2 diabetes mellitus without complications: Secondary | ICD-10-CM

## 2011-07-31 MED ORDER — GLIPIZIDE 10 MG PO TABS: 10.0000 mg | ORAL_TABLET | Freq: Two times a day (BID) | ORAL | Status: DC

## 2011-07-31 NOTE — Assessment & Plan Note (Signed)
HbA1c checked early today because patient brought no sugar logs with her.  9.7% down from 11.4% last time on POC A1c.  Given its only been 1 1/2 months, control since last visit likely better than 9.7% represents.  This is mainly due to her diet improvement, stopping juices.  Still having some sugared sodas though.  I gave her choice of starting insulin now, which she will likely ultimately need, or one more trial of maximal PO regimen she can afford with orange card and diet improvement.  She chose the latter.  Will increase Glipizide to 10 units BID, continue metformin 1000mg  BID.  No more sugared sodas or juice at all.  Fu in one month.  Bring meter and all readings to that visit.

## 2011-07-31 NOTE — Patient Instructions (Signed)
Please check your sugars 4 times per day: One fasting in morning before breakfast, one two hours after breakfast, one right before dinner, and one at least an hour after dinner before bed.  Please return to clinic in 1 month.  Bring your meter or sugar log to that visit.

## 2011-07-31 NOTE — Progress Notes (Signed)
Diabetes Self-Management Training (DSMT)  Follow-Up 3 Visit  Appt start time: 1130 end time:  1200.  08/01/2011 [name], identified by name and date of birth, is a 36 y.o. female with Type 2 Diabetes.  ASSESSMENT          Primary concerns today: Blood sugar control.  Patient thinking about using insulin while working on behavior changes desired. Has made some change regarding eating less carb containing foods. Hoping to exercise more during summer.  Willing to self monitor per physician instructions and use this as a guide for food intake.   Recent physical activity includes traveling, caring for son and going to school. .  Progress Towards Goal(s):  Little progress.   Nutrition: To improve blood glucose control I will follow meal plan of low calories and low carbs for weight loss and blood sugar control Some of the time 25%  Medications: To improve blood glucose levels, I will take my medication as prescribed Most of the time 75%  Monitoring: To identify blood glucose trends, I will test my blood glucose once daily as needed and report blood sugar consistently more than 200mg /dl day and Sometimes 45%  NB-2.1 Physical inactivity As related to excess sedentary time.  As evidenced by her report and insulin resistance also with little progress.      Intervention:   1- Nutrition Education about using self monitoring as a guide to assist with making healthy lifestyle changes. 2- Social support and encouragement about using insulin while working on desired healthier lifestyle.    Monitoring/Evaluation:  Dietary intake, exercise, bloodsugars, and body weight in 2 week(s)

## 2011-07-31 NOTE — Progress Notes (Signed)
Subjective:   Patient ID: Emily [name] female   DOB: December 13, 1975 35 y.o.   MRN: [id number]  HPI: [name] is a 36 y.o. with DM here for fu.  Her HbA1c had jumped a lot last time, largely due to drinking a lot of juice (pineapple juice).  She has cut this out of her diet entirely, but she still drinks some sugared sodas.  She did not bring her meter and cannot remember what her sugars have been running.    Past Medical History  Diagnosis Date  . Obesity, unspecified   . PANIC DISORDER   . ANXIETY   . DEPRESSION   . DIABETES MELLITUS, TYPE II dx 04/2009  . GERD    Current Outpatient Prescriptions  Medication Sig Dispense Refill  . albuterol (PROVENTIL HFA;VENTOLIN HFA) 108 (90 BASE) MCG/ACT inhaler Inhale 1-2 puffs into the lungs every 6 ([name]) hours as needed for wheezing.  1 Inhaler  0  . Calcium Carbonate (CALCIUM 600 PO) Take by mouth 2 (two) times daily.        . cetirizine (ZYRTEC) 10 MG tablet Take 10 mg by mouth daily.      . citalopram (CELEXA) 20 MG tablet Take 20 mg by mouth daily.      Marland Kitchen glipiZIDE (GLUCOTROL) 10 MG tablet Take 1 tablet (10 mg total) by mouth 2 (two) times daily before a meal.  62 tablet  6  . glucose blood (ONE TOUCH ULTRA TEST) test strip Use as instructed  100 each  6  . lisinopril (PRINIVIL,ZESTRIL) 10 MG tablet Take 1 tablet (10 mg total) by mouth daily.  30 tablet  6  . LORazepam (ATIVAN) 0.5 MG tablet Take 1 tablet (0.5 mg total) by mouth every 8 (eight) hours as needed.  30 tablet  1  . medroxyPROGESTERone (DEPO-PROVERA) 150 MG/ML injection Inject 150 mg into the muscle every 3 (three) months.        . metFORMIN (GLUCOPHAGE) 1000 MG tablet Take 1 tablet (1,000 mg total) by mouth 2 (two) times daily with a meal.  60 tablet  6  . omeprazole (PRILOSEC) 20 MG capsule Take 1 capsule (20 mg total) by mouth daily.  30 capsule  6  . ONE TOUCH LANCETS MISC Please use as directed  200 each  1   Current Facility-Administered Medications    Medication Dose Route Frequency Provider Last Rate Last Dose  . DISCONTD: pneumococcal 23 valent vaccine (PNU-IMMUNE) injection 0.5 mL  0.5 mL Intramuscular Once Newt Lukes, MD       Family History  Problem Relation Age of Onset  . Sarcoidosis Mother 36  . Asthma Mother   . Hyperlipidemia Mother   . Hypertension Mother   . Coronary artery disease Father     Had CABG, and angioplasty around age 49  . Diabetes Father   . Pulmonary fibrosis Sister 30  . Asthma Sister    History   Social History  . Marital Status: Legally Separated    Spouse Name: N/A    Number of Children: N/A  . Years of Education: N/A   Social History Main Topics  . Smoking status: Never Smoker   . Smokeless tobacco: None   Comment: Has 55 yt old son Emily [name] who lives with her. (Autism & ADD) Work at [employer]  . Alcohol Use: No  . Drug Use: No  . Sexually Active: Yes    Birth Control/ Protection: Condom   Other Topics Concern  . None  Social History Narrative  . None   Review of Systems: Constitutional: Denies fever, chills, diaphoresis, appetite change and fatigue.   Respiratory: Denies SOB, DOE, cough, chest tightness,  and wheezing.   Cardiovascular: Denies chest pain, palpitations and leg swelling.  Gastrointestinal: Denies nausea, vomiting, abdominal pain, diarrhea, constipation, blood in stool and abdominal distention.  Genitourinary: Denies dysuria, urgency, frequency, hematuria, flank pain and difficulty urinating.   Skin: Denies pallor, rash and wound.  Neurological: Denies dizziness, seizures, syncope, weakness, light-headedness, numbness and headaches.    Objective:  Physical Exam: Filed Vitals:   07/31/11 1035  BP: 124/81  Pulse: 82  Temp: 96.7 F (35.9 C)  TempSrc: Oral  Height: 5\' 4"  (1.626 m)  Weight: 238 lb 3.2 oz (108.047 kg)   Constitutional: Vital signs reviewed.  Patient is a well-developed and well-nourished obese woman in no acute distress and  cooperative with exam. Alert and oriented x3.  Head: Normocephalic and atraumatic  Mouth: no erythema or exudates, MMM Eyes: PERRL, EOMI, conjunctivae normal, No scleral icterus.  Neck: No JVD Cardiovascular: RRR, S1 normal, S2 normal, no MRG, pulses symmetric and intact bilaterally Pulmonary/Chest: CTAB, no wheezes, rales, or rhonchi  Musculoskeletal: No joint deformities, erythema, or stiffness, ROM full and no nontender   Neurological: A&O x3, Strength is normal and symmetric bilaterally, cranial nerve II-XII are grossly intact, no focal motor deficit, sensory intact to light touch bilaterally.  Skin: Warm, dry and intact. No rash, cyanosis, or clubbing.    Assessment & Plan:

## 2011-08-20 ENCOUNTER — Encounter: Payer: Self-pay | Admitting: Dietician

## 2011-09-14 ENCOUNTER — Ambulatory Visit (INDEPENDENT_AMBULATORY_CARE_PROVIDER_SITE_OTHER): Payer: Self-pay | Admitting: General Practice

## 2011-09-14 VITALS — BP 120/78 | HR 93 | Temp 97.5°F | Ht 64.0 in | Wt 234.1 lb

## 2011-09-14 DIAGNOSIS — N949 Unspecified condition associated with female genital organs and menstrual cycle: Secondary | ICD-10-CM

## 2011-09-14 DIAGNOSIS — N938 Other specified abnormal uterine and vaginal bleeding: Secondary | ICD-10-CM

## 2011-09-14 MED ORDER — MEDROXYPROGESTERONE ACETATE 150 MG/ML IM SUSP
150.0000 mg | INTRAMUSCULAR | Status: DC
  Administered 2011-09-14 – 2015-08-22 (×6): 150 mg via INTRAMUSCULAR

## 2011-11-30 ENCOUNTER — Ambulatory Visit (INDEPENDENT_AMBULATORY_CARE_PROVIDER_SITE_OTHER): Payer: Self-pay | Admitting: General Practice

## 2011-11-30 VITALS — BP 132/90 | HR 105 | Temp 97.6°F | Ht 64.0 in | Wt 226.2 lb

## 2011-11-30 DIAGNOSIS — N938 Other specified abnormal uterine and vaginal bleeding: Secondary | ICD-10-CM

## 2011-11-30 DIAGNOSIS — N949 Unspecified condition associated with female genital organs and menstrual cycle: Secondary | ICD-10-CM

## 2011-11-30 MED ORDER — MEDROXYPROGESTERONE ACETATE 150 MG/ML IM SUSP
150.0000 mg | Freq: Once | INTRAMUSCULAR | Status: AC
  Administered 2011-11-30: 150 mg via INTRAMUSCULAR

## 2011-12-31 ENCOUNTER — Telehealth: Payer: Self-pay | Admitting: *Deleted

## 2011-12-31 NOTE — Telephone Encounter (Signed)
CALLED PATIENT PHONE, RECORDING IN SPANISH, LEFT VOICE MESSAGE FOR PATIENT TO CALL OPC. Stephan Nelis NT 11-4-013  11:00AM

## 2011-12-31 NOTE — Telephone Encounter (Signed)
CALLED PATIENT AND LEFT VOICE MESSAGE FOR PATIENT TO CALL [facility] TO SET UP [facility]. LELA STURDIVANT NT 11-4-013 10:36AM

## 2012-02-14 ENCOUNTER — Encounter: Payer: Self-pay | Admitting: Internal Medicine

## 2012-02-15 ENCOUNTER — Ambulatory Visit (INDEPENDENT_AMBULATORY_CARE_PROVIDER_SITE_OTHER): Payer: Self-pay

## 2012-02-15 VITALS — BP 134/88 | HR 78 | Wt 226.2 lb

## 2012-02-15 DIAGNOSIS — N938 Other specified abnormal uterine and vaginal bleeding: Secondary | ICD-10-CM

## 2012-02-15 DIAGNOSIS — N949 Unspecified condition associated with female genital organs and menstrual cycle: Secondary | ICD-10-CM

## 2012-02-15 MED ORDER — MEDROXYPROGESTERONE ACETATE 150 MG/ML IM SUSP
150.0000 mg | Freq: Once | INTRAMUSCULAR | Status: AC
  Administered 2012-02-15: 150 mg via INTRAMUSCULAR

## 2012-04-03 ENCOUNTER — Encounter: Payer: Self-pay | Admitting: Internal Medicine

## 2012-05-02 ENCOUNTER — Ambulatory Visit: Payer: Self-pay

## 2012-05-05 ENCOUNTER — Ambulatory Visit (INDEPENDENT_AMBULATORY_CARE_PROVIDER_SITE_OTHER): Payer: Self-pay | Admitting: *Deleted

## 2012-05-05 VITALS — BP 122/89 | HR 80 | Ht 64.0 in | Wt 222.5 lb

## 2012-05-05 DIAGNOSIS — N938 Other specified abnormal uterine and vaginal bleeding: Secondary | ICD-10-CM

## 2012-05-05 DIAGNOSIS — N949 Unspecified condition associated with female genital organs and menstrual cycle: Secondary | ICD-10-CM

## 2012-05-05 MED ORDER — MEDROXYPROGESTERONE ACETATE 150 MG/ML IM SUSP
150.0000 mg | Freq: Once | INTRAMUSCULAR | Status: AC
  Administered 2012-05-05: 150 mg via INTRAMUSCULAR

## 2012-06-13 ENCOUNTER — Emergency Department (HOSPITAL_COMMUNITY): Payer: Self-pay

## 2012-06-13 ENCOUNTER — Emergency Department (HOSPITAL_COMMUNITY)
Admission: EM | Admit: 2012-06-13 | Discharge: 2012-06-13 | Disposition: A | Discharge status: Home / Self Care | Payer: Self-pay | Attending: Emergency Medicine | Admitting: Emergency Medicine

## 2012-06-13 ENCOUNTER — Encounter (HOSPITAL_COMMUNITY): Payer: Self-pay | Admitting: Emergency Medicine

## 2012-06-13 DIAGNOSIS — W010XXA Fall on same level from slipping, tripping and stumbling without subsequent striking against object, initial encounter: Secondary | ICD-10-CM | POA: Insufficient documentation

## 2012-06-13 DIAGNOSIS — Z79899 Other long term (current) drug therapy: Secondary | ICD-10-CM | POA: Insufficient documentation

## 2012-06-13 DIAGNOSIS — Y9301 Activity, walking, marching and hiking: Secondary | ICD-10-CM | POA: Insufficient documentation

## 2012-06-13 DIAGNOSIS — E669 Obesity, unspecified: Secondary | ICD-10-CM | POA: Insufficient documentation

## 2012-06-13 DIAGNOSIS — Z8659 Personal history of other mental and behavioral disorders: Secondary | ICD-10-CM | POA: Insufficient documentation

## 2012-06-13 DIAGNOSIS — Y929 Unspecified place or not applicable: Secondary | ICD-10-CM | POA: Insufficient documentation

## 2012-06-13 DIAGNOSIS — Z8719 Personal history of other diseases of the digestive system: Secondary | ICD-10-CM | POA: Insufficient documentation

## 2012-06-13 DIAGNOSIS — IMO0002 Reserved for concepts with insufficient information to code with codable children: Secondary | ICD-10-CM | POA: Insufficient documentation

## 2012-06-13 DIAGNOSIS — W1809XA Striking against other object with subsequent fall, initial encounter: Secondary | ICD-10-CM | POA: Insufficient documentation

## 2012-06-13 DIAGNOSIS — E119 Type 2 diabetes mellitus without complications: Secondary | ICD-10-CM | POA: Insufficient documentation

## 2012-06-13 DIAGNOSIS — S20219A Contusion of unspecified front wall of thorax, initial encounter: Secondary | ICD-10-CM | POA: Insufficient documentation

## 2012-06-13 MED ORDER — IOHEXOL 300 MG/ML  SOLN
80.0000 mL | Freq: Once | INTRAMUSCULAR | Status: AC | PRN
  Administered 2012-06-13: 80 mL via INTRAVENOUS

## 2012-06-13 MED ORDER — LORAZEPAM 2 MG/ML IJ SOLN
1.0000 mg | Freq: Once | INTRAMUSCULAR | Status: AC
  Administered 2012-06-13: 1 mg via INTRAVENOUS
  Filled 2012-06-13: qty 1

## 2012-06-13 MED ORDER — ONDANSETRON HCL 4 MG/2ML IJ SOLN: 4.0000 mg | Freq: Once | INTRAMUSCULAR | Status: DC

## 2012-06-13 MED ORDER — KETOROLAC TROMETHAMINE 15 MG/ML IJ SOLN
15.0000 mg | Freq: Once | INTRAMUSCULAR | Status: DC
  Filled 2012-06-13: qty 1

## 2012-06-13 MED ORDER — OXYCODONE-ACETAMINOPHEN 5-325 MG PO TABS: 1.0000 | ORAL_TABLET | ORAL | Status: DC | PRN

## 2012-06-13 MED ORDER — MORPHINE SULFATE 4 MG/ML IJ SOLN
6.0000 mg | Freq: Once | INTRAMUSCULAR | Status: AC
  Administered 2012-06-13: 6 mg via INTRAVENOUS
  Filled 2012-06-13: qty 2

## 2012-06-13 MED ORDER — ONDANSETRON HCL 4 MG/2ML IJ SOLN
4.0000 mg | Freq: Once | INTRAMUSCULAR | Status: AC
  Administered 2012-06-13: 4 mg via INTRAVENOUS
  Filled 2012-06-13: qty 2

## 2012-06-13 NOTE — ED Notes (Signed)
RUE:AV40<JW> Expected date:<BR> Expected time:<BR> Means of arrival:<BR> Comments:<BR> Abdominal pain

## 2012-06-13 NOTE — ED Notes (Signed)
Per EMS: pt was walking downstairs and at the last step she slipped, fell forward and hit her chest on the door in front of her. Hurts when taking deep breath. 20 g in left hand

## 2012-06-13 NOTE — ED Notes (Signed)
Pt states that as she slipped she felt something pop in her chest, and then her body felt against the door.

## 2012-06-17 NOTE — ED Provider Notes (Signed)
History    36yF with CP. Onset just before arrival after falling. Lost balance and struck chest wall against a door. Persistent pain since which is worse with deep respiration. No sob. No HA, neck or pain. Has been ambulatory since.  CSN: [id number]  Arrival date & time 06/13/12  4098   First MD Initiated Contact with Patient 06/13/12 [phone]      Chief Complaint  Patient presents with  . Chest Pain  . Back Pain    (Consider location/radiation/quality/duration/timing/severity/associated sxs/prior treatment) HPI  Past Medical History  Diagnosis Date  . Obesity, unspecified   . PANIC DISORDER   . ANXIETY   . DEPRESSION   . DIABETES MELLITUS, TYPE II dx 04/2009  . GERD     Past Surgical History  Procedure Laterality Date  . No past surgeries    . Spine surgery  07/20/2010    Family History  Problem Relation Age of Onset  . Sarcoidosis Mother 24  . Asthma Mother   . Hyperlipidemia Mother   . Hypertension Mother   . Coronary artery disease Father     Had CABG, and angioplasty around age 46  . Diabetes Father   . Pulmonary fibrosis Sister 30  . Asthma Sister     History  Substance Use Topics  . Smoking status: Never Smoker   . Smokeless tobacco: Not on file     Comment: Has 48 yt old son Dorma Russell who lives with her. (Autism & ADD) Work at [employer]  . Alcohol Use: No    OB History   Grav Para Term Preterm Abortions TAB SAB Ect Mult Living   2    1  1   1       Review of Systems  All systems reviewed and negative, other than as noted in HPI.   Allergies  Zolpidem tartrate  Home Medications   Current Outpatient Rx  Name  Route  Sig  Dispense  Refill  . albuterol (PROVENTIL HFA;VENTOLIN HFA) 108 (90 BASE) MCG/ACT inhaler   Inhalation   Inhale 1-2 puffs into the lungs every 6 (six) hours as needed for wheezing.   1 Inhaler   0   . aspirin 325 MG tablet   Oral   Take 650 mg by mouth daily as needed for pain.         . medroxyPROGESTERone  (DEPO-PROVERA) 150 MG/ML injection   Intramuscular   Inject 150 mg into the muscle every 3 (three) months.           . ONE TOUCH LANCETS MISC      Please use as directed   200 each   1   . oxyCODONE-acetaminophen (PERCOCET/ROXICET) 5-325 MG per tablet   Oral   Take 1-2 tablets by mouth every 4 (four) hours as needed for pain.   12 tablet   0     BP 135/87  Pulse 83  Temp(Src) 98.2 F (36.8 C) (Oral)  Resp 16  SpO2 100%  Physical Exam  Nursing note and vitals reviewed. Constitutional: No distress.  Laying in bed. mildly uncomfortable appearing. Obese.  HENT:  Head: Normocephalic and atraumatic.  Eyes: Conjunctivae are normal. Right eye exhibits no discharge. Left eye exhibits no discharge.  Neck: Neck supple.  Cardiovascular: Normal rate, regular rhythm and normal heart sounds.  Exam reveals no gallop and no friction rub.   No murmur heard. Pulmonary/Chest: Effort normal and breath sounds normal. No respiratory distress. She exhibits tenderness.  Tenderness R axillary  region, over sternum and L anterior chest. No crepitus or concerning skin changes.   Abdominal: Soft. She exhibits no distension. There is no tenderness.  Musculoskeletal: She exhibits no edema and no tenderness.  No midline spinal tenderness  Neurological: She is alert.  Skin: Skin is warm and dry.  Psychiatric: She has a normal mood and affect. Her behavior is normal. Thought content normal.    ED Course  Procedures (including critical care time)  Labs Reviewed - No data to display No results found.  Dg Cervical Spine Complete  06/13/2012  *RADIOLOGY REPORT*  Clinical Data: Back pain, chest pain  CERVICAL SPINE - COMPLETE 4+ VIEW  Comparison: None.  Findings: Seven views of the cervical spine submitted.  Alignment, disc spaces and vertebral height are preserved.  Minimal anterior spurring lower endplate of C4 vertebral body.  No prevertebral soft tissue swelling.  Cervical airway is patent.  C1-C2  relationship is unremarkable.  IMPRESSION: No acute fracture or subluxation.   Original Report Authenticated By: Natasha Mead, M.D.    Ct Chest W Contrast  06/13/2012  *RADIOLOGY REPORT*  Clinical Data: Left anterior chest pain after fall.  CT CHEST WITH CONTRAST  Technique:  Multidetector CT imaging of the chest was performed following the standard protocol during bolus administration of intravenous contrast.  Contrast: 80mL OMNIPAQUE IOHEXOL 300 MG/ML  SOLN  Comparison: None.  Findings: Lungs/pleura: No pleural effusion identified.  No pneumothorax or evidence of pulmonary contusion.  4 mm nodule is identified in the right base, image 29/series 9.  Heart/Mediastinum: The heart size is normal.  There is no pericardial effusion.  No mediastinal or hilar adenopathy.  Upper abdomen: 10 mm hyperattenuating structure within the dome of liver is identified, image 33/series 4.  Bones/Musculoskeletal:  No acute findings identified.  IMPRESSION:  1.  No acute findings. 2.  4 mm nodule is noted in the right lung base. If the patient is at high risk for bronchogenic carcinoma, follow-up chest CT at 1 year is recommended.  If the patient is at low risk, no follow-up is needed.  This recommendation follows the consensus statement: Guidelines for Management of Small Pulmonary Nodules Detected on CT Scans:  A Statement from the Fleischner Society as published in Radiology 2005; 237:395-400.   Original Report Authenticated By: Signa Kell, M.D.     1. Chest wall contusion, unspecified laterality, initial encounter       MDM  36yf with cp after fall. Imaging neg. Likely chest wall contusion. Plan symptomatic tx.         Raeford Razor, MD 06/17/12 [phone]

## 2012-06-23 ENCOUNTER — Encounter: Payer: Self-pay | Admitting: Internal Medicine

## 2012-07-22 ENCOUNTER — Ambulatory Visit (INDEPENDENT_AMBULATORY_CARE_PROVIDER_SITE_OTHER): Payer: Self-pay

## 2012-07-22 VITALS — BP 131/88 | HR 74 | Wt 218.9 lb

## 2012-07-22 DIAGNOSIS — N938 Other specified abnormal uterine and vaginal bleeding: Secondary | ICD-10-CM

## 2012-07-22 DIAGNOSIS — N949 Unspecified condition associated with female genital organs and menstrual cycle: Secondary | ICD-10-CM

## 2012-07-22 MED ORDER — MEDROXYPROGESTERONE ACETATE 150 MG/ML IM SUSP
150.0000 mg | Freq: Once | INTRAMUSCULAR | Status: AC
  Administered 2012-07-22: 150 mg via INTRAMUSCULAR

## 2012-09-04 ENCOUNTER — Other Ambulatory Visit: Payer: Self-pay

## 2012-10-07 ENCOUNTER — Ambulatory Visit: Payer: Self-pay

## 2012-10-10 ENCOUNTER — Ambulatory Visit (INDEPENDENT_AMBULATORY_CARE_PROVIDER_SITE_OTHER): Payer: Self-pay

## 2012-10-10 VITALS — BP 120/84 | HR 94 | Wt 216.0 lb

## 2012-10-10 DIAGNOSIS — N949 Unspecified condition associated with female genital organs and menstrual cycle: Secondary | ICD-10-CM

## 2012-10-10 DIAGNOSIS — N938 Other specified abnormal uterine and vaginal bleeding: Secondary | ICD-10-CM

## 2012-10-10 MED ORDER — MEDROXYPROGESTERONE ACETATE 150 MG/ML IM SUSP: 150.0000 mg | Freq: Once | INTRAMUSCULAR | Status: DC

## 2012-11-17 ENCOUNTER — Encounter (HOSPITAL_COMMUNITY): Payer: Self-pay | Admitting: *Deleted

## 2012-11-17 ENCOUNTER — Emergency Department (HOSPITAL_COMMUNITY): Payer: No Typology Code available for payment source

## 2012-11-17 ENCOUNTER — Emergency Department (HOSPITAL_COMMUNITY)
Admission: EM | Admit: 2012-11-17 | Discharge: 2012-11-17 | Disposition: A | Discharge status: Home / Self Care | Payer: No Typology Code available for payment source | Attending: Emergency Medicine | Admitting: Emergency Medicine

## 2012-11-17 DIAGNOSIS — E669 Obesity, unspecified: Secondary | ICD-10-CM | POA: Insufficient documentation

## 2012-11-17 DIAGNOSIS — Z79899 Other long term (current) drug therapy: Secondary | ICD-10-CM | POA: Insufficient documentation

## 2012-11-17 DIAGNOSIS — E119 Type 2 diabetes mellitus without complications: Secondary | ICD-10-CM | POA: Insufficient documentation

## 2012-11-17 DIAGNOSIS — K219 Gastro-esophageal reflux disease without esophagitis: Secondary | ICD-10-CM

## 2012-11-17 DIAGNOSIS — Z8659 Personal history of other mental and behavioral disorders: Secondary | ICD-10-CM | POA: Insufficient documentation

## 2012-11-17 DIAGNOSIS — Z7982 Long term (current) use of aspirin: Secondary | ICD-10-CM | POA: Insufficient documentation

## 2012-11-17 MED ORDER — SUCRALFATE 1 G PO TABS: 1.0000 g | ORAL_TABLET | Freq: Four times a day (QID) | ORAL | Status: DC

## 2012-11-17 MED ORDER — OMEPRAZOLE 20 MG PO CPDR: 20.0000 mg | DELAYED_RELEASE_CAPSULE | Freq: Every day | ORAL | Status: DC

## 2012-11-17 NOTE — ED Notes (Signed)
Patient transported to X-ray 

## 2012-11-17 NOTE — ED Notes (Signed)
Pt reports chest "discomfort" since yesterday. Reports can feel lump in middle of chest, pt reports "it feels like an airbubble caught in her chest". Pain 8/10. Hx of acid reflux, reports doesn't feel like acid reflux. Reports some SOB.

## 2012-11-17 NOTE — ED Provider Notes (Signed)
CSN: [id number]     Arrival date & time 11/17/12  1233 History   First MD Initiated Contact with Patient 11/17/12 1317     Chief Complaint  Patient presents with  . Chest Pain   (Consider location/radiation/quality/duration/timing/severity/associated sxs/prior Treatment) Patient is a 37 y.o. female presenting with chest pain. The history is provided by the patient.  Chest Pain  patient here complaining of epigastric pain is worse with eating or swallowing x5 days. Denies any anginal type chest pain. Denies any vomiting with this. Does have a history of GERD. Denies any black or bloody stools. Has been using her PPI without relief. No prior history of same. Denies any dyspnea but does note some cough and she does have a history of asthma. Denies any leg pain or swelling. Past Medical History  Diagnosis Date  . Obesity, unspecified   . PANIC DISORDER   . ANXIETY   . DEPRESSION   . DIABETES MELLITUS, TYPE II dx 04/2009  . GERD    Past Surgical History  Procedure Laterality Date  . No past surgeries    . Spine surgery  07/20/2010   Family History  Problem Relation Age of Onset  . Sarcoidosis Mother 75  . Asthma Mother   . Hyperlipidemia Mother   . Hypertension Mother   . Coronary artery disease Father     Had CABG, and angioplasty around age 53  . Diabetes Father   . Pulmonary fibrosis Sister 30  . Asthma Sister    History  Substance Use Topics  . Smoking status: Never Smoker   . Smokeless tobacco: Not on file     Comment: Has 36 yt old son Dorma Russell who lives with her. (Autism & ADD) Work at [employer]  . Alcohol Use: No   OB History   Grav Para Term Preterm Abortions TAB SAB Ect Mult Living   2    1  1   1      Review of Systems  Cardiovascular: Positive for chest pain.  All other systems reviewed and are negative.    Allergies  Zolpidem tartrate  Home Medications   Current Outpatient Rx  Name  Route  Sig  Dispense  Refill  . albuterol (PROVENTIL  HFA;VENTOLIN HFA) 108 (90 BASE) MCG/ACT inhaler   Inhalation   Inhale 2 puffs into the lungs every 4 (four) hours as needed for wheezing.         Marland Kitchen aspirin 325 MG tablet   Oral   Take 325 mg by mouth every morning.          Marland Kitchen ibuprofen (ADVIL,MOTRIN) 200 MG tablet   Oral   Take 400 mg by mouth every 6 (six) hours as needed for headache.         . medroxyPROGESTERone (DEPO-PROVERA) 150 MG/ML injection   Intramuscular   Inject 150 mg into the muscle every 3 (three) months.           Marland Kitchen tetrahydrozoline 0.05 % ophthalmic solution   Both Eyes   Place 1 drop into both eyes 4 (four) times daily as needed (For eye redenss or irritation.).          BP 139/86  Pulse 97  Temp(Src) 98.1 F (36.7 C) (Oral)  Resp 16  SpO2 99% Physical Exam  Nursing note and vitals reviewed. Constitutional: She is oriented to person, place, and time. She appears well-developed and well-nourished.  Non-toxic appearance. No distress.  HENT:  Head: Normocephalic and atraumatic.  Eyes:  Conjunctivae, EOM and lids are normal. Pupils are equal, round, and reactive to light.  Neck: Normal range of motion. Neck supple. No tracheal deviation present. No mass present.  Cardiovascular: Normal rate, regular rhythm and normal heart sounds.  Exam reveals no gallop.   No murmur heard. Pulmonary/Chest: Effort normal and breath sounds normal. No stridor. No respiratory distress. She has no decreased breath sounds. She has no wheezes. She has no rhonchi. She has no rales.  Abdominal: Soft. Normal appearance and bowel sounds are normal. She exhibits no distension. There is no tenderness. There is no rigidity, no rebound, no guarding and no CVA tenderness.  Musculoskeletal: Normal range of motion. She exhibits no edema and no tenderness.  Neurological: She is alert and oriented to person, place, and time. She has normal strength. No cranial nerve deficit or sensory deficit. GCS eye subscore is 4. GCS verbal subscore is  5. GCS motor subscore is 6.  Skin: Skin is warm and dry. No abrasion and no rash noted.  Psychiatric: She has a normal mood and affect. Her speech is normal and behavior is normal.    ED Course  Procedures (including critical care time) Labs Review Labs Reviewed - No data to display Imaging Review No results found.  MDM  No diagnosis found.  Date: 11/17/2012  Rate: 95  Rhythm: normal sinus rhythm  QRS Axis: normal  Intervals: normal  ST/T Wave abnormalities: normal  Conduction Disutrbances:none  Narrative Interpretation: LV[facility]  Old EKG Reviewed: none available  Patient has been off of her GERD medication and current symptoms are likely from her reflux. Patient possibly has some esophageal stricture but she exhibits swallow her food appropriately. Will get GI referral and placed back on her PPI    Toy Baker, MD 11/17/12 1431

## 2012-12-26 ENCOUNTER — Ambulatory Visit (INDEPENDENT_AMBULATORY_CARE_PROVIDER_SITE_OTHER): Payer: Self-pay | Admitting: Obstetrics and Gynecology

## 2012-12-26 VITALS — BP 133/76 | HR 86 | Wt 211.0 lb

## 2012-12-26 DIAGNOSIS — N938 Other specified abnormal uterine and vaginal bleeding: Secondary | ICD-10-CM

## 2012-12-26 DIAGNOSIS — N949 Unspecified condition associated with female genital organs and menstrual cycle: Secondary | ICD-10-CM

## 2012-12-26 MED ORDER — MEDROXYPROGESTERONE ACETATE 104 MG/0.65ML ~~LOC~~ SUSP
104.0000 mg | Freq: Once | SUBCUTANEOUS | Status: AC
  Administered 2012-12-26: 104 mg via SUBCUTANEOUS

## 2013-01-01 ENCOUNTER — Other Ambulatory Visit: Payer: Self-pay

## 2013-01-17 ENCOUNTER — Telehealth: Payer: Self-pay | Admitting: Internal Medicine

## 2013-02-13 ENCOUNTER — Emergency Department (HOSPITAL_COMMUNITY)
Admission: EM | Admit: 2013-02-13 | Discharge: 2013-02-13 | Disposition: A | Discharge status: Home / Self Care | Payer: No Typology Code available for payment source | Attending: Emergency Medicine | Admitting: Emergency Medicine

## 2013-02-13 ENCOUNTER — Encounter (HOSPITAL_COMMUNITY): Payer: Self-pay | Admitting: Emergency Medicine

## 2013-02-13 DIAGNOSIS — R739 Hyperglycemia, unspecified: Secondary | ICD-10-CM

## 2013-02-13 DIAGNOSIS — R5381 Other malaise: Secondary | ICD-10-CM | POA: Insufficient documentation

## 2013-02-13 DIAGNOSIS — R61 Generalized hyperhidrosis: Secondary | ICD-10-CM | POA: Insufficient documentation

## 2013-02-13 DIAGNOSIS — R259 Unspecified abnormal involuntary movements: Secondary | ICD-10-CM | POA: Insufficient documentation

## 2013-02-13 DIAGNOSIS — E119 Type 2 diabetes mellitus without complications: Secondary | ICD-10-CM | POA: Insufficient documentation

## 2013-02-13 DIAGNOSIS — K219 Gastro-esophageal reflux disease without esophagitis: Secondary | ICD-10-CM | POA: Insufficient documentation

## 2013-02-13 DIAGNOSIS — Z79899 Other long term (current) drug therapy: Secondary | ICD-10-CM | POA: Insufficient documentation

## 2013-02-13 DIAGNOSIS — R110 Nausea: Secondary | ICD-10-CM | POA: Insufficient documentation

## 2013-02-13 DIAGNOSIS — R6883 Chills (without fever): Secondary | ICD-10-CM | POA: Insufficient documentation

## 2013-02-13 DIAGNOSIS — R42 Dizziness and giddiness: Secondary | ICD-10-CM | POA: Insufficient documentation

## 2013-02-13 DIAGNOSIS — Z8659 Personal history of other mental and behavioral disorders: Secondary | ICD-10-CM | POA: Insufficient documentation

## 2013-02-13 DIAGNOSIS — R51 Headache: Secondary | ICD-10-CM | POA: Insufficient documentation

## 2013-02-13 DIAGNOSIS — E669 Obesity, unspecified: Secondary | ICD-10-CM | POA: Insufficient documentation

## 2013-02-13 LAB — POCT I-STAT, CHEM 8
BUN: 8 mg/dL (ref 6–23)
Calcium, Ion: 1.22 mmol/L (ref 1.12–1.23)
Creatinine, Ser: 0.7 mg/dL (ref 0.50–1.10)
Glucose, Bld: 256 mg/dL — ABNORMAL HIGH (ref 70–99)
Hemoglobin: 17 g/dL — ABNORMAL HIGH (ref 12.0–15.0)
Sodium: 138 mEq/L (ref 135–145)
TCO2: 24 mmol/L (ref 0–100)

## 2013-02-13 LAB — POCT I-STAT 3, VENOUS BLOOD GAS (G3P V)
Acid-base deficit: 1 mmol/L (ref 0.0–2.0)
O2 Saturation: 63 %
TCO2: 26 mmol/L (ref 0–100)
pCO2, Ven: 45.4 mmHg (ref 45.0–50.0)
pH, Ven: 7.343 — ABNORMAL HIGH (ref 7.250–7.300)
pO2, Ven: 35 mmHg (ref 30.0–45.0)

## 2013-02-13 LAB — GLUCOSE, CAPILLARY: Glucose-Capillary: 282 mg/dL — ABNORMAL HIGH (ref 70–99)

## 2013-02-13 LAB — URINALYSIS, ROUTINE W REFLEX MICROSCOPIC
Bilirubin Urine: NEGATIVE
Glucose, UA: >1000 mg/dL — AB
Hgb urine dipstick: NEGATIVE
Ketones, ur: 40 mg/dL — AB
Leukocytes, UA: NEGATIVE
Nitrite: NEGATIVE
Protein, ur: NEGATIVE mg/dL
Specific Gravity, Urine: 1.038 — ABNORMAL HIGH (ref 1.005–1.030)
Urobilinogen, UA: 0.2 mg/dL (ref 0.0–1.0)
pH: 5 (ref 5.0–8.0)

## 2013-02-13 LAB — URINE MICROSCOPIC-ADD ON

## 2013-02-13 MED ORDER — ONDANSETRON 4 MG PO TBDP
4.0000 mg | ORAL_TABLET | Freq: Once | ORAL | Status: AC
  Administered 2013-02-13: 4 mg via ORAL
  Filled 2013-02-13: qty 1

## 2013-02-13 MED ORDER — ACETAMINOPHEN 325 MG PO TABS
650.0000 mg | ORAL_TABLET | Freq: Once | ORAL | Status: AC
  Administered 2013-02-13: 650 mg via ORAL
  Filled 2013-02-13: qty 2

## 2013-02-13 NOTE — ED Notes (Signed)
Per ems: pt woke this am with cbg 427, newly diagnosed diabetic that states she has not started her new meds yet. En route ems obtained a cbg of 279. VSS, no pain

## 2013-02-13 NOTE — ED Provider Notes (Signed)
CSN: [id number]     Arrival date & time 02/13/13  1128 History  This chart was scribed for non-physician practitioner Junius Finner, PA-C, working with Gwyneth Sprout, MD by Dorothey Baseman, ED Scribe. This patient was seen in room TR06C/TR06C and the patient's care was started at 12:57 PM.    Chief Complaint  Patient presents with  . Hyperglycemia   The history is provided by the patient. No language interpreter was used.   HPI Comments: Emily [name] is a 37 y.o. Female with a history of type II DM brought in by EMS from her PCP who presents to the Emergency Department complaining of hyperglycemia onset this morning (CBG 427 measured at home, 299 measured at her PCP, 279 measured en route by EMS, 282 upon arrival to the ED). Patient reports associated diaphoresis, chills, tremors, headache, fatigue, nausea, and lightheadedness. She reports that most of her symptoms have since resolved, except that the headache persists. Patient reports that she was just recently diagnosed with DM and has not started her new medications yet because she was advised by her PCP not to start the glyposide until her labs resulted. She denies abdominal pain or emesis. Patient reports that she has been on Lisinopril for 2 days. Patient has no other pertinent medical history.   Past Medical History  Diagnosis Date  . Obesity, unspecified   . PANIC DISORDER   . ANXIETY   . DEPRESSION   . DIABETES MELLITUS, TYPE II dx 04/2009  . GERD    Past Surgical History  Procedure Laterality Date  . No past surgeries    . Spine surgery  07/20/2010   Family History  Problem Relation Age of Onset  . Sarcoidosis Mother 52  . Asthma Mother   . Hyperlipidemia Mother   . Hypertension Mother   . Coronary artery disease Father     Had CABG, and angioplasty around age 79  . Diabetes Father   . Pulmonary fibrosis Sister 30  . Asthma Sister    History  Substance Use Topics  . Smoking status: Never Smoker   . Smokeless  tobacco: Not on file     Comment: Has 59 yt old son Dorma Russell who lives with her. (Autism & ADD) Work at [employer]  . Alcohol Use: No   OB History   Grav Para Term Preterm Abortions TAB SAB Ect Mult Living   2    1  1   1      Review of Systems  Constitutional: Positive for chills, diaphoresis and fatigue.  Gastrointestinal: Positive for nausea. Negative for vomiting and abdominal pain.  Neurological: Positive for tremors, light-headedness and headaches.  All other systems reviewed and are negative.    Allergies  Zolpidem tartrate  Home Medications   Current Outpatient Rx  Name  Route  Sig  Dispense  Refill  . glipiZIDE (GLUCOTROL) 5 MG tablet   Oral   Take 5 mg by mouth daily before breakfast.         . lisinopril (PRINIVIL,ZESTRIL) 10 MG tablet   Oral   Take 10 mg by mouth daily.         Marland Kitchen omeprazole (PRILOSEC) 20 MG capsule   Oral   Take 20 mg by mouth daily.         Marland Kitchen albuterol (PROVENTIL HFA;VENTOLIN HFA) 108 (90 BASE) MCG/ACT inhaler   Inhalation   Inhale 2 puffs into the lungs every 4 (four) hours as needed for wheezing.         Marland Kitchen  ibuprofen (ADVIL,MOTRIN) 200 MG tablet   Oral   Take 400 mg by mouth every 6 (six) hours as needed for headache.         . medroxyPROGESTERone (DEPO-PROVERA) 150 MG/ML injection   Intramuscular   Inject 150 mg into the muscle every 3 (three) months.            Triage Vitals: BP 123/80  Pulse 92  Temp(Src) 98.1 F (36.7 C) (Oral)  Resp 15  Ht 5\' 4"  (1.626 m)  Wt 211 lb (95.709 kg)  BMI 36.20 kg/m2  SpO2 99%  Physical Exam  Nursing note and vitals reviewed. Constitutional: She is oriented to person, place, and time. She appears well-developed and well-nourished.  HENT:  Head: Normocephalic and atraumatic.  Eyes: EOM are normal.  Neck: Normal range of motion.  Cardiovascular: Normal rate, regular rhythm and normal heart sounds.   Pulmonary/Chest: Effort normal and breath sounds normal. No respiratory  distress.  Musculoskeletal: Normal range of motion.  Neurological: She is alert and oriented to person, place, and time.  Skin: Skin is warm and dry.  Psychiatric: She has a normal mood and affect. Her behavior is normal.    ED Course  Procedures (including critical care time)  DIAGNOSTIC STUDIES: Oxygen Saturation is 99% on room air, normal by my interpretation.    COORDINATION OF CARE: 1:01 PM- Will order UA pending prior results from her PCP. Encouraged fluids. Discussed treatment plan with patient at bedside and patient verbalized agreement.   2:17 PM- Discussed that UA does indicate the presence of ketones in the urine. Discussed pt with Dr. Anitra Lauth, will get VBG and istat chem 8. Discussed treatment plan with patient at bedside and patient verbalized agreement.   Labs: unremarkable.  Pt is not acidotic at this time.  Pt may be discharged home.     Labs Review Labs Reviewed  GLUCOSE, CAPILLARY - Abnormal; Notable for the following:    Glucose-Capillary 282 (*)    All other components within normal limits  URINALYSIS, ROUTINE W REFLEX MICROSCOPIC - Abnormal; Notable for the following:    Specific Gravity, Urine 1.038 (*)    Glucose, UA >1000 (*)    Ketones, ur 40 (*)    All other components within normal limits  POCT I-STAT, CHEM 8 - Abnormal; Notable for the following:    Glucose, Bld 256 (*)    Hemoglobin 17.0 (*)    HCT 50.0 (*)    All other components within normal limits  POCT I-STAT 3, BLOOD GAS (G3P V) - Abnormal; Notable for the following:    pH, Ven 7.343 (*)    Bicarbonate 24.7 (*)    All other components within normal limits  URINE MICROSCOPIC-ADD ON   Imaging Review No results found.  EKG Interpretation   None       MDM   1. Hyperglycemia    Pt not acidotic at this time. Vitals: unremarkable. Pt able to tolerate fluids. Advised pt to start her glipizide as prescribed by her PCP and to f/u on Monday, or next available appointment.   All  labs/imaging/findings discussed with patient. All questions answered and concerns addressed. Will discharge pt home and have pt f/u with her PCP. Return precautions given. Pt verbalized understanding and agreement with tx plan. Vitals: unremarkable. Discharged in stable condition.    Discussed pt with Dr. Anitra Lauth during ED encounter and agrees with plan.   I personally performed the services described in this documentation, which was scribed in my  presence. The recorded information has been reviewed and is accurate.     Junius Finner, PA-C 02/13/13 1556

## 2013-02-13 NOTE — ED Provider Notes (Signed)
Medical screening examination/treatment/procedure(s) were performed by non-physician practitioner and as supervising physician I was immediately available for consultation/collaboration.  EKG Interpretation   None         Gwyneth Sprout, MD 02/13/13 1607

## 2013-03-19 ENCOUNTER — Other Ambulatory Visit (INDEPENDENT_AMBULATORY_CARE_PROVIDER_SITE_OTHER): Payer: No Typology Code available for payment source | Admitting: *Deleted

## 2013-03-19 VITALS — BP 122/83 | HR 87 | Wt 218.6 lb

## 2013-03-19 DIAGNOSIS — N949 Unspecified condition associated with female genital organs and menstrual cycle: Secondary | ICD-10-CM

## 2013-03-19 DIAGNOSIS — N925 Other specified irregular menstruation: Secondary | ICD-10-CM

## 2013-03-19 DIAGNOSIS — N938 Other specified abnormal uterine and vaginal bleeding: Secondary | ICD-10-CM

## 2013-03-19 MED ORDER — MEDROXYPROGESTERONE ACETATE 104 MG/0.65ML ~~LOC~~ SUSP
104.0000 mg | Freq: Once | SUBCUTANEOUS | Status: AC
  Administered 2013-03-19: 104 mg via SUBCUTANEOUS

## 2013-03-20 ENCOUNTER — Other Ambulatory Visit: Payer: Self-pay

## 2013-05-14 ENCOUNTER — Emergency Department (HOSPITAL_COMMUNITY)
Admission: EM | Admit: 2013-05-14 | Discharge: 2013-05-14 | Disposition: A | Discharge status: Home / Self Care | Payer: No Typology Code available for payment source | Attending: Emergency Medicine | Admitting: Emergency Medicine

## 2013-05-14 ENCOUNTER — Emergency Department (HOSPITAL_COMMUNITY): Payer: No Typology Code available for payment source

## 2013-05-14 ENCOUNTER — Encounter (HOSPITAL_COMMUNITY): Payer: Self-pay | Admitting: Emergency Medicine

## 2013-05-14 DIAGNOSIS — K219 Gastro-esophageal reflux disease without esophagitis: Secondary | ICD-10-CM | POA: Insufficient documentation

## 2013-05-14 DIAGNOSIS — Y9389 Activity, other specified: Secondary | ICD-10-CM | POA: Insufficient documentation

## 2013-05-14 DIAGNOSIS — E119 Type 2 diabetes mellitus without complications: Secondary | ICD-10-CM | POA: Insufficient documentation

## 2013-05-14 DIAGNOSIS — F410 Panic disorder [episodic paroxysmal anxiety] without agoraphobia: Secondary | ICD-10-CM | POA: Insufficient documentation

## 2013-05-14 DIAGNOSIS — Z79899 Other long term (current) drug therapy: Secondary | ICD-10-CM | POA: Insufficient documentation

## 2013-05-14 DIAGNOSIS — Y929 Unspecified place or not applicable: Secondary | ICD-10-CM | POA: Insufficient documentation

## 2013-05-14 DIAGNOSIS — S3981XA Other specified injuries of abdomen, initial encounter: Secondary | ICD-10-CM | POA: Insufficient documentation

## 2013-05-14 DIAGNOSIS — R0789 Other chest pain: Secondary | ICD-10-CM

## 2013-05-14 DIAGNOSIS — S239XXA Sprain of unspecified parts of thorax, initial encounter: Secondary | ICD-10-CM | POA: Insufficient documentation

## 2013-05-14 DIAGNOSIS — S39012A Strain of muscle, fascia and tendon of lower back, initial encounter: Secondary | ICD-10-CM

## 2013-05-14 DIAGNOSIS — X500XXA Overexertion from strenuous movement or load, initial encounter: Secondary | ICD-10-CM | POA: Insufficient documentation

## 2013-05-14 DIAGNOSIS — E669 Obesity, unspecified: Secondary | ICD-10-CM | POA: Insufficient documentation

## 2013-05-14 MED ORDER — LORAZEPAM 0.5 MG PO TABS: 0.5000 mg | ORAL_TABLET | Freq: Three times a day (TID) | ORAL | Status: DC | PRN

## 2013-05-14 MED ORDER — TRAMADOL HCL 50 MG PO TABS: 50.0000 mg | ORAL_TABLET | Freq: Four times a day (QID) | ORAL | Status: DC | PRN

## 2013-05-14 MED ORDER — OXYCODONE-ACETAMINOPHEN 5-325 MG PO TABS
1.0000 | ORAL_TABLET | Freq: Once | ORAL | Status: AC
  Administered 2013-05-14: 1 via ORAL
  Filled 2013-05-14: qty 1

## 2013-05-14 MED ORDER — ONDANSETRON 4 MG PO TBDP
4.0000 mg | ORAL_TABLET | Freq: Once | ORAL | Status: AC
  Administered 2013-05-14: 4 mg via ORAL
  Filled 2013-05-14: qty 1

## 2013-05-14 NOTE — Discharge Instructions (Signed)
Take the prescribed medication as directed.  Do not take tramadol and ativan at the same time.  Do not drive while taking these medications, it can make you drowsy. Follow-up with your primary care physician. Return to the ED for new or worsening symptoms.

## 2013-05-14 NOTE — ED Notes (Signed)
Pt states nausea has diminished. Ready to go home now.

## 2013-05-14 NOTE — ED Notes (Signed)
Pt denies any numbness or tingling in any extremities.

## 2013-05-14 NOTE — ED Notes (Signed)
Pt started feeling nauseated after sitting up for discharge. Layed pt back down. Gave Sprite and saltines.

## 2013-05-14 NOTE — ED Notes (Signed)
Pt returned from xray

## 2013-05-14 NOTE — Discharge Planning (Signed)
[facility] Felicia E, Community Liaison  Patient is an [facility] [place], and has established care with the practice. Patient expressed no concerns with getting medications or appointments with her PCP. My contact information was given for any future questions or concerns. No other needs at this time.

## 2013-05-14 NOTE — ED Notes (Signed)
Pt states she went to lift her 785 year old son and she felt her back crack and and something in her chest pulled

## 2013-05-14 NOTE — ED Provider Notes (Signed)
CSN: [id number]     Arrival date & time 05/14/13  1225 History  This chart was scribed for non-physician practitioner, Sharilyn SitesLisa Mosella Kasa, PA-C working with Juliet RudeNathan R. Rubin PayorPickering, MD by Greggory StallionKayla Andersen, ED scribe. This patient was seen in room TR11C/TR11C and the patient's care was started at 12:52 PM.    Chief Complaint  Patient presents with  . Back Pain  . Chest Pain   The history is provided by the patient. No language interpreter was used.   HPI Comments: Emily [name] is a 38 y.o. female who presents to the Emergency Department complaining of sudden onset mid back pain and substernal chest tenderness that started earlier today. She states she went to lift her 740 year old autistic son and felt her back "pop" and something "pull" in her chest.  She denies any chest pain prior to lifting her son.  No palpitations, SOB, dizziness, lightheadedness, diaphoresis, nausea, or vomiting.  No prior cardiac hx.  Pt is not a smoker.   Denies any numbness or paresthesias of extremities. No loss of bowel or bladder control.  No intervention tried PTA.   Past Medical History  Diagnosis Date  . Obesity, unspecified   . PANIC DISORDER   . ANXIETY   . DEPRESSION   . DIABETES MELLITUS, TYPE II dx 04/2009  . GERD    Past Surgical History  Procedure Laterality Date  . No past surgeries    . Spine surgery  07/20/2010   Family History  Problem Relation Age of Onset  . Sarcoidosis Mother 6555  . Asthma Mother   . Hyperlipidemia Mother   . Hypertension Mother   . Coronary artery disease Father     Had CABG, and angioplasty around age 38  . Diabetes Father   . Pulmonary fibrosis Sister 30  . Asthma Sister    History  Substance Use Topics  . Smoking status: Never Smoker   . Smokeless tobacco: Not on file     Comment: Has 719 yt old son Emily [name] who lives with her. (Autism & ADD) Work at [employer]  . Alcohol Use: No   OB History   Grav Para Term Preterm Abortions TAB SAB Ect Mult Living   2    1   1   1      Review of Systems  Musculoskeletal: Positive for back pain and myalgias.  All other systems reviewed and are negative.   Allergies  Zolpidem tartrate  Home Medications   Current Outpatient Rx  Name  Route  Sig  Dispense  Refill  . albuterol (PROVENTIL HFA;VENTOLIN HFA) 108 (90 BASE) MCG/ACT inhaler   Inhalation   Inhale 2 puffs into the lungs every 4 (four) hours as needed for wheezing.         Marland Kitchen. glipiZIDE (GLUCOTROL) 5 MG tablet   Oral   Take 5 mg by mouth daily before breakfast.         . ibuprofen (ADVIL,MOTRIN) 200 MG tablet   Oral   Take 400 mg by mouth every 6 (six) hours as needed for headache.         . lisinopril (PRINIVIL,ZESTRIL) 10 MG tablet   Oral   Take 10 mg by mouth daily.         . medroxyPROGESTERone (DEPO-PROVERA) 150 MG/ML injection   Intramuscular   Inject 150 mg into the muscle every 3 (three) months.           Marland Kitchen. omeprazole (PRILOSEC) 20 MG capsule  Oral   Take 20 mg by mouth daily.          BP 117/69  Pulse 95  Temp(Src) 98.6 F (37 C)  Resp 16  SpO2 98%  Physical Exam  Nursing note and vitals reviewed. Constitutional: She is oriented to person, place, and time. She appears well-developed and well-nourished.  HENT:  Head: Normocephalic and atraumatic.  Mouth/Throat: Oropharynx is clear and moist.  Eyes: Conjunctivae and EOM are normal. Pupils are equal, round, and reactive to light.  Neck: Normal range of motion.  Cardiovascular: Normal rate, regular rhythm and normal heart sounds.   Pulmonary/Chest: Effort normal and breath sounds normal.  Tenderness to palpation of anterior chest wall. No crepitus or deformities. Lungs clear bilaterally.  Abdominal: Soft. Bowel sounds are normal.  Musculoskeletal: Normal range of motion.  Tenderness to palpation of thoracic paraspinal regions bilaterally. No midline, step off or deformity. Some pain with ROM.   Neurological: She is alert and oriented to person, place, and  time.  Normal strength of upper and lower extremities bilaterally, moving limbs without signs of ataxia  Skin: Skin is warm and dry.  Psychiatric: She has a normal mood and affect.    ED Course  Procedures (including critical care time)   Date: 05/14/2013  Rate: 86  Rhythm: normal sinus rhythm  QRS Axis: normal  Intervals: normal  ST/T Wave abnormalities: nonspecific T wave changes  Conduction Disutrbances:none  Narrative Interpretation:   Old EKG Reviewed: unchanged    DIAGNOSTIC STUDIES: Oxygen Saturation is 98% on RA, normal by my interpretation.    COORDINATION OF CARE: 12:54 PM-Discussed treatment plan which includes chest xray with pt at bedside and pt agreed to plan.   Labs Review Labs Reviewed - No data to display Imaging Review Dg Chest 2 View  05/14/2013   CLINICAL DATA:  Chest pain and back pain.  EXAM: CHEST  2 VIEW  COMPARISON:  DG CHEST 2 VIEW dated 11/17/2012  FINDINGS: Lungs are clear bilaterally. Heart and mediastinum are within normal limits. The trachea is midline. Low lung volumes on the lateral view. Bony thorax is intact.  IMPRESSION: No acute cardiopulmonary disease.   Electronically Signed   By: Richarda Overlie M.D.   On: 05/14/2013 14:18     EKG Interpretation None      MDM   Final diagnoses:  Back strain  Chest wall pain   EKG normal sinus rhythm, no acute ischemic changes. Chest x-ray is clear. On exam patient's chest pain is reproducible with palpation to anterior chest wall. At this time I have low suspicion for ACS, PE, dissection, or other acute cardiac event.  Back pain without signs or symptoms concerning for cauda equina, likely strain.  Patient was given Tramadol for pain. While discussing x-ray results, patient's family noted that pt has become very anxious while in the ED due to situation with her son and has run out of her anxiety meds (ativen 0.5mg  which was confirmed on medical records). I've given a short supply and instructed her take  these with caution and only if needed.  She is instructed to FU with her primary care physician.  Discussed plan with pt, they agreed.  Return precautions advised.  I personally performed the services described in this documentation, which was scribed in my presence. The recorded information has been reviewed and is accurate.  Garlon Hatchet, PA-C 05/14/13 1539

## 2013-05-15 NOTE — ED Provider Notes (Signed)
Medical screening examination/treatment/procedure(s) were performed by non-physician practitioner and as supervising physician I was immediately available for consultation/collaboration.   EKG Interpretation None       Kyheem Bathgate R. Cedrik Heindl, MD 05/15/13 0647 

## 2013-06-04 ENCOUNTER — Other Ambulatory Visit: Payer: Self-pay

## 2013-06-10 ENCOUNTER — Ambulatory Visit (INDEPENDENT_AMBULATORY_CARE_PROVIDER_SITE_OTHER): Payer: No Typology Code available for payment source

## 2013-06-10 VITALS — BP 121/74 | HR 89 | Temp 97.7°F | Ht 64.0 in | Wt 224.6 lb

## 2013-06-10 DIAGNOSIS — Z3049 Encounter for surveillance of other contraceptives: Secondary | ICD-10-CM

## 2013-06-10 MED ORDER — MEDROXYPROGESTERONE ACETATE 104 MG/0.65ML ~~LOC~~ SUSP
104.0000 mg | Freq: Once | SUBCUTANEOUS | Status: AC
  Administered 2013-06-10: 104 mg via SUBCUTANEOUS

## 2013-09-02 ENCOUNTER — Ambulatory Visit (INDEPENDENT_AMBULATORY_CARE_PROVIDER_SITE_OTHER): Payer: No Typology Code available for payment source

## 2013-09-02 VITALS — BP 119/84 | HR 82 | Temp 97.0°F | Ht 64.0 in | Wt 220.8 lb

## 2013-09-02 DIAGNOSIS — N938 Other specified abnormal uterine and vaginal bleeding: Secondary | ICD-10-CM

## 2013-09-02 DIAGNOSIS — N949 Unspecified condition associated with female genital organs and menstrual cycle: Secondary | ICD-10-CM

## 2013-09-02 MED ORDER — MEDROXYPROGESTERONE ACETATE 104 MG/0.65ML ~~LOC~~ SUSP
104.0000 mg | Freq: Once | SUBCUTANEOUS | Status: AC
  Administered 2013-09-02: 104 mg via SUBCUTANEOUS

## 2013-09-02 NOTE — Progress Notes (Signed)
Patient here today for injection of depo provera 104mg . States it is managing her bleeding well. No complaints or concerns. Continue with 104mg  for DUB. Injection administered and tolerated well. Patient to return between 9/29-10/14.

## 2013-10-19 ENCOUNTER — Ambulatory Visit: Payer: No Typology Code available for payment source

## 2013-11-25 ENCOUNTER — Ambulatory Visit (INDEPENDENT_AMBULATORY_CARE_PROVIDER_SITE_OTHER): Payer: No Typology Code available for payment source | Admitting: *Deleted

## 2013-11-25 VITALS — BP 137/77 | HR 90

## 2013-11-25 DIAGNOSIS — N938 Other specified abnormal uterine and vaginal bleeding: Secondary | ICD-10-CM

## 2013-11-25 DIAGNOSIS — N949 Unspecified condition associated with female genital organs and menstrual cycle: Secondary | ICD-10-CM

## 2013-11-25 DIAGNOSIS — N925 Other specified irregular menstruation: Secondary | ICD-10-CM

## 2013-11-25 MED ORDER — MEDROXYPROGESTERONE ACETATE 104 MG/0.65ML ~~LOC~~ SUSP
104.0000 mg | Freq: Once | SUBCUTANEOUS | Status: AC
  Administered 2013-11-25: 104 mg via SUBCUTANEOUS

## 2013-11-25 NOTE — Progress Notes (Signed)
Instructed patient she has not seen a provider in a while and at her next appointment needs to see provider before continues getting shot, but can give injection today. She is getting the depoprovera for dub and states the depoprovera is working well, she does not have any bleeding.

## 2013-12-11 ENCOUNTER — Other Ambulatory Visit: Payer: Self-pay

## 2013-12-28 ENCOUNTER — Encounter (HOSPITAL_COMMUNITY): Payer: Self-pay | Admitting: Emergency Medicine

## 2014-02-17 ENCOUNTER — Ambulatory Visit (INDEPENDENT_AMBULATORY_CARE_PROVIDER_SITE_OTHER): Payer: No Typology Code available for payment source | Admitting: Obstetrics & Gynecology

## 2014-02-17 ENCOUNTER — Encounter: Payer: Self-pay | Admitting: Obstetrics & Gynecology

## 2014-02-17 VITALS — BP 139/92 | HR 106 | Temp 97.9°F | Ht 64.0 in | Wt 212.5 lb

## 2014-02-17 DIAGNOSIS — Z01419 Encounter for gynecological examination (general) (routine) without abnormal findings: Secondary | ICD-10-CM

## 2014-02-17 DIAGNOSIS — N938 Other specified abnormal uterine and vaginal bleeding: Secondary | ICD-10-CM

## 2014-02-17 NOTE — Progress Notes (Signed)
Patient states depoprovera 104 working well, but prefers 150mg , given as ordered

## 2014-02-17 NOTE — Progress Notes (Signed)
Patient ID: Emily [name], female   DOB: 04/16/1975, 38 y.o.   MRN: [id number] Subjective:     Emily [name] is a 38 y.o. female here for a routine exam.  Pt amenorrheic on Depo Provera.  She has been on it for bleeding since 2004.  Current complaints: none.    not consistently taking Vit D or calcium.   Gynecologic History No LMP recorded. Patient has had an injection. Contraception: Depo-Provera injections Last Pap: 04/2011. Results were: normal. No HPV done  Last mammogram: never had.   Obstetric History OB History  Gravida Para Term Preterm AB SAB TAB Ectopic Multiple Living  2    1 1    1     # Outcome Date GA Lbr Len/2nd Weight Sex Delivery Anes PTL Lv  2 Gravida 08/30/00          1 SAB 1999               The following portions of the patient's history were reviewed and updated as appropriate: allergies, current medications, past family history, past medical history, past social history, past surgical history and problem list.  Review of Systems A comprehensive review of systems was negative.    Objective:    BP 139/92 mmHg  Pulse 106  Temp(Src) 97.9 F (36.6 C)  Ht 5\' 4"  (1.626 m)  Wt 212 lb 8 oz (96.389 kg)  BMI 36.46 kg/m2  General Appearance:    Alert, cooperative, no distress, appears stated age              Throat:   Lips, mucosa, and tongue normal; teeth and gums normal  Neck:   Supple, symmetrical, trachea midline, no adenopathy;    thyroid:  no enlargement/tenderness/nodules; no carotid   bruit or JVD  Back:     Symmetric, no curvature, ROM normal, no CVA tenderness  Lungs:     Clear to auscultation bilaterally, respirations unlabored  Chest Wall:    No tenderness or deformity   Heart:    Regular rate and rhythm, S1 and S2 normal, no murmur, rub   or gallop  Breast Exam:    No tenderness, masses, or nipple abnormality  Abdomen:     Soft, non-tender, bowel sounds active all four quadrants,    no masses, no organomegaly; obese  Genitalia:     Normal female without lesion, discharge or tenderness     Extremities:   Extremities normal, atraumatic, no cyanosis or edema  Pulses:   2+ and symmetric all extremities  Skin:   Skin color, texture, turgor normal, no rashes or lesions             03/2011 Clinical Data: Abnormal uterine bleeding. On Depo-Provera.  TRANSABDOMINAL AND TRANSVAGINAL ULTRASOUND OF PELVIS  Technique: Both transabdominal and transvaginal ultrasound examinations of the pelvis were performed. Transabdominal technique was performed for global imaging of the pelvis including uterus, ovaries, adnexal regions, and pelvic cul-de-sac.  It was necessary to proceed with endovaginal exam following the transabdominal exam to visualize the endometrium and ovaries.  Comparison: 06/09/2003  Findings: Uterus: 8.6 x 4.3 x 6.8 cm. No fibroids or other uterine masses are identified.  Endometrium: Double layer thickness measures 3 mm transvaginally. Normal appearance.  Right ovary: 3.4 x 1.8 x 2.0 cm. Normal appearance.  Left ovary: 2.6 x 1.2 x 2.3 cm. Normal appearance.  Other Findings: No free fluid  IMPRESSION:  1. No evidence of fibroids or abnormal endometrial thickening. If abnormal uterine bleeding is unresponsive to  hormonal or medical therapy, sonohysterogram could be performed for focal lesion work- up. 2. Normal ovaries. No evidence of adnexal mass or free fluid.  Assessment:    Healthy female exam.   DUB- improved with Depo Provera    Plan:    Follow up in: 1 year.    Add Calcium and Vit D 1200mg  and 800 daily Keep Depo Provera Mammogram at age [age]years.

## 2014-02-17 NOTE — Patient Instructions (Signed)

## 2014-05-06 ENCOUNTER — Ambulatory Visit: Payer: Self-pay

## 2014-05-17 ENCOUNTER — Ambulatory Visit (INDEPENDENT_AMBULATORY_CARE_PROVIDER_SITE_OTHER): Payer: Self-pay | Admitting: General Practice

## 2014-05-17 VITALS — BP 136/87 | HR 80 | Temp 97.8°F | Ht 64.0 in | Wt 203.8 lb

## 2014-05-17 DIAGNOSIS — N938 Other specified abnormal uterine and vaginal bleeding: Secondary | ICD-10-CM

## 2014-05-17 MED ORDER — MEDROXYPROGESTERONE ACETATE 150 MG/ML IM SUSP
150.0000 mg | Freq: Once | INTRAMUSCULAR | Status: AC
  Administered 2014-05-17: 150 mg via INTRAMUSCULAR

## 2014-06-20 ENCOUNTER — Emergency Department (HOSPITAL_COMMUNITY)
Admission: EM | Admit: 2014-06-20 | Discharge: 2014-06-20 | Disposition: A | Discharge status: Home / Self Care | Payer: Self-pay | Attending: Emergency Medicine | Admitting: Emergency Medicine

## 2014-06-20 ENCOUNTER — Encounter (HOSPITAL_COMMUNITY): Payer: Self-pay | Admitting: *Deleted

## 2014-06-20 ENCOUNTER — Emergency Department (HOSPITAL_COMMUNITY): Payer: Self-pay

## 2014-06-20 DIAGNOSIS — E1165 Type 2 diabetes mellitus with hyperglycemia: Secondary | ICD-10-CM | POA: Insufficient documentation

## 2014-06-20 DIAGNOSIS — F410 Panic disorder [episodic paroxysmal anxiety] without agoraphobia: Secondary | ICD-10-CM | POA: Insufficient documentation

## 2014-06-20 DIAGNOSIS — E669 Obesity, unspecified: Secondary | ICD-10-CM | POA: Insufficient documentation

## 2014-06-20 DIAGNOSIS — Z3202 Encounter for pregnancy test, result negative: Secondary | ICD-10-CM | POA: Insufficient documentation

## 2014-06-20 DIAGNOSIS — R55 Syncope and collapse: Secondary | ICD-10-CM | POA: Insufficient documentation

## 2014-06-20 DIAGNOSIS — Z79899 Other long term (current) drug therapy: Secondary | ICD-10-CM | POA: Insufficient documentation

## 2014-06-20 DIAGNOSIS — K219 Gastro-esophageal reflux disease without esophagitis: Secondary | ICD-10-CM | POA: Insufficient documentation

## 2014-06-20 DIAGNOSIS — R000 Tachycardia, unspecified: Secondary | ICD-10-CM | POA: Insufficient documentation

## 2014-06-20 DIAGNOSIS — F419 Anxiety disorder, unspecified: Secondary | ICD-10-CM | POA: Insufficient documentation

## 2014-06-20 DIAGNOSIS — R739 Hyperglycemia, unspecified: Secondary | ICD-10-CM

## 2014-06-20 DIAGNOSIS — F329 Major depressive disorder, single episode, unspecified: Secondary | ICD-10-CM | POA: Insufficient documentation

## 2014-06-20 LAB — URINALYSIS, ROUTINE W REFLEX MICROSCOPIC
BILIRUBIN URINE: NEGATIVE
Glucose, UA: >1000 mg/dL — AB
Hgb urine dipstick: NEGATIVE
KETONES UR: NEGATIVE mg/dL
Leukocytes, UA: NEGATIVE
NITRITE: NEGATIVE
Protein, ur: NEGATIVE mg/dL
Specific Gravity, Urine: 1.039 — ABNORMAL HIGH (ref 1.005–1.030)
UROBILINOGEN UA: 0.2 mg/dL (ref 0.0–1.0)
pH: 5 (ref 5.0–8.0)

## 2014-06-20 LAB — CBG MONITORING, ED
Glucose-Capillary: 394 mg/dL — ABNORMAL HIGH (ref 70–99)
Glucose-Capillary: 225 mg/dL — ABNORMAL HIGH (ref 70–99)

## 2014-06-20 LAB — I-STAT TROPONIN, ED: Troponin i, poc: 0 ng/mL (ref 0.00–0.08)

## 2014-06-20 LAB — CBC
HEMATOCRIT: 43.7 % (ref 36.0–46.0)
Hemoglobin: 14.3 g/dL (ref 12.0–15.0)
MCH: 29.9 pg (ref 26.0–34.0)
MCHC: 32.7 g/dL (ref 30.0–36.0)
MCV: 91.4 fL (ref 78.0–100.0)
Platelets: 267 10*3/uL (ref 150–400)
RBC: 4.78 MIL/uL (ref 3.87–5.11)
RDW: 12.9 % (ref 11.5–15.5)
WBC: 8.8 10*3/uL (ref 4.0–10.5)

## 2014-06-20 LAB — BASIC METABOLIC PANEL
Anion gap: 17 — ABNORMAL HIGH (ref 5–15)
BUN: 7 mg/dL (ref 6–23)
CALCIUM: 9.3 mg/dL (ref 8.4–10.5)
CHLORIDE: 103 mmol/L (ref 96–112)
CO2: 16 mmol/L — AB (ref 19–32)
Creatinine, Ser: 1.17 mg/dL — ABNORMAL HIGH (ref 0.50–1.10)
GFR calc Af Amer: 68 mL/min — ABNORMAL LOW (ref >90)
GFR, EST NON AFRICAN AMERICAN: 58 mL/min — AB (ref >90)
GLUCOSE: 433 mg/dL — AB (ref 70–99)
Potassium: 3.5 mmol/L (ref 3.5–5.1)
SODIUM: 136 mmol/L (ref 135–145)

## 2014-06-20 LAB — POC URINE PREG, ED: Preg Test, Ur: NEGATIVE

## 2014-06-20 LAB — URINE MICROSCOPIC-ADD ON

## 2014-06-20 MED ORDER — SODIUM CHLORIDE 0.9 % IV BOLUS (SEPSIS)
1000.0000 mL | INTRAVENOUS | Status: AC
  Administered 2014-06-20: 1000 mL via INTRAVENOUS

## 2014-06-20 NOTE — ED Provider Notes (Signed)
CSN: [id number]     Arrival date & time 06/20/14  1707 History   First MD Initiated Contact with Patient 06/20/14 1832     Chief Complaint  Patient presents with  . Loss of Consciousness   (Consider location/radiation/quality/duration/timing/severity/associated sxs/prior Treatment) HPI  Emily [name] is a 39 yo female with PMH: diabetes, anxiety and depression, presenting with report of syncopal episode.  Pt states she was feeling well today and had just eaten lunch a few hours before returning to church. While singing at church she suddenly felt very hot and light-headed and blacked out.  She was told she passed out for a few seconds and did not immediately open her eyes but could hear people talking around her. She reports she is a diabetic but has stopped taking her oral diabetes medicines because she thought she could fix it by exercising. Her cbg checked at the scene was 360. She does report she has been urinating more frequently.  She denies any headache or recent illness or injury.  She denies any unilateral weakness, visual changes, nausea, vomiting or abd pain.     Past Medical History  Diagnosis Date  . Obesity, unspecified   . PANIC DISORDER   . ANXIETY   . DEPRESSION   . DIABETES MELLITUS, TYPE II dx 04/2009  . GERD    Past Surgical History  Procedure Laterality Date  . No past surgeries    . Spine surgery  07/20/2010   Family History  Problem Relation Age of Onset  . Sarcoidosis Mother [id number]  . Asthma Mother   . Hyperlipidemia Mother   . Hypertension Mother   . Coronary artery disease Father     Had CABG, and angioplasty around age 39  . Diabetes Father   . Pulmonary fibrosis Sister 30  . Asthma Sister    History  Substance Use Topics  . Smoking status: Never Smoker   . Smokeless tobacco: Never Used     Comment: Has 189 yt old son [name] who lives with her. (Autism & ADD) Work at [employer] center [phone]  . Alcohol Use: No   OB History    Gravida Para Term Preterm  AB TAB SAB Ectopic Multiple Living   2    1  1   1      Review of Systems  Constitutional: Negative for fever and chills.  HENT: Negative for sore throat.   Eyes: Negative for visual disturbance.  Respiratory: Negative for cough and shortness of breath.   Cardiovascular: Negative for chest pain and leg swelling.  Gastrointestinal: Negative for nausea, vomiting and diarrhea.  Endocrine: Positive for polyuria.  Genitourinary: Negative for dysuria.  Musculoskeletal: Negative for myalgias.  Skin: Negative for rash.  Neurological: Positive for syncope. Negative for weakness, numbness and headaches.    Allergies  Zolpidem tartrate  Home Medications   Prior to Admission medications   Medication Sig Start Date End Date Taking? Authorizing Provider  albuterol (PROVENTIL HFA;VENTOLIN HFA) 108 (90 BASE) MCG/ACT inhaler Inhale 2 puffs into the lungs every 4 (four) hours as needed for wheezing.    Historical Provider, MD  Calcium Carbonate-Vitamin D (CALCIUM + D PO) Take 1 capsule by mouth every morning.    Historical Provider, MD  cloNIDine (CATAPRES) 0.1 MG tablet Take 0.1 mg by mouth daily.    Historical Provider, MD  glipiZIDE (GLUCOTROL) 5 MG tablet Take 10 mg by mouth daily.     Historical Provider, MD  LORazepam (ATIVAN) 0.5 MG tablet Take 1 tablet (  0.5 mg total) by mouth 3 (three) times daily as needed for anxiety. 05/14/13   Garlon Hatchet, PA-C  medroxyPROGESTERone (DEPO-PROVERA) 150 MG/ML injection Inject 150 mg into the muscle every 3 (three) months.      Historical Provider, MD  traMADol (ULTRAM) 50 MG tablet Take 1 tablet (50 mg total) by mouth every 6 (six) hours as needed. 05/14/13   Garlon Hatchet, PA-C   BP 138/88 mmHg  Pulse 111  Temp(Src) 98.3 F (36.8 C) (Oral)  Resp 18  Ht  (1.626 m)  Wt 198 lb 3.2 oz (89.903 kg)  BMI 34.00 kg/m2  SpO2 99% Physical Exam  Constitutional: She is oriented to person, place, and time. She appears well-developed and well-nourished. No  distress.  HENT:  Head: Normocephalic and atraumatic.  Mouth/Throat: Oropharynx is clear and moist. Mucous membranes are dry.  Eyes: Conjunctivae are normal. Pupils are equal, round, and reactive to light.  Neck: Normal range of motion. Neck supple.  Cardiovascular: Regular rhythm and intact distal pulses.  Tachycardia present.   Pulmonary/Chest: Effort normal and breath sounds normal. No respiratory distress. She has no decreased breath sounds. She has no wheezes. She has no rhonchi. She has no rales. She exhibits no tenderness.  Abdominal: Soft. There is no tenderness.  Musculoskeletal: She exhibits no tenderness.  Lymphadenopathy:    She has no cervical adenopathy.  Neurological: She is alert and oriented to person, place, and time. She has normal strength. No cranial nerve deficit or sensory deficit. Coordination normal. GCS eye subscore is 4. GCS verbal subscore is 5. GCS motor subscore is 6.  Cranial nerves 2-12 intact.  Skin: Skin is warm and dry. No rash noted. She is not diaphoretic.  Psychiatric: She has a normal mood and affect.  Nursing note and vitals reviewed.   ED Course  Procedures (including critical care time) Labs Review Labs Reviewed  BASIC METABOLIC PANEL - Abnormal; Notable for the following:    CO2 16 (*)    Glucose, Bld 433 (*)    Creatinine, Ser 1.17 (*)    GFR calc non Af Amer 58 (*)    GFR calc Af Amer 68 (*)    Anion gap 17 (*)    All other components within normal limits  URINALYSIS, ROUTINE W REFLEX MICROSCOPIC - Abnormal; Notable for the following:    Specific Gravity, Urine 1.039 (*)    Glucose, UA >1000 (*)    All other components within normal limits  CBG MONITORING, ED - Abnormal; Notable for the following:    Glucose-Capillary 394 (*)    All other components within normal limits  CBG MONITORING, ED - Abnormal; Notable for the following:    Glucose-Capillary 225 (*)    All other components within normal limits  CBC  URINE MICROSCOPIC-ADD ON   I-STAT TROPOININ, ED  POC URINE PREG, ED    Imaging Review Dg Chest 2 View  06/20/2014   CLINICAL DATA:  Pt states she passed out at church today; she says her blood sugar is high and was 394; pt complains of lightheadedness; she is diabetic and on HTN meds; h/o bronchitis; non-smoker  EXAM: CHEST  2 VIEW  COMPARISON:  05/14/2013  FINDINGS: The heart size and mediastinal contours are within normal limits. Both lungs are clear. The visualized skeletal structures are unremarkable.  IMPRESSION: No active cardiopulmonary disease.   Electronically Signed   By: Elige Ko   On: 06/20/2014 18:20     EKG Interpretation  Date/Time:  Sunday June 20 2014 17:24:13 EDT Ventricular Rate:  107 PR Interval:  148 QRS Duration: 80 QT Interval:  318 QTC Calculation: 424 R Axis:   14 Text Interpretation:  Sinus tachycardia Possible Anterior infarct , age  undetermined Abnormal ECG Confirmed by Fayrene Fearing  MD, MARK ([id number]) on  06/20/2014 11:21:13 PM      MDM   Final diagnoses:  Syncope, unspecified syncope type  Hyperglycemia   39 yo with syncopal episode, no chest pain, palpitations, or focal deficits. CBC,  BMP, I-stat troponin, UA, EKG and CXR.  Glucose is elevated to 433 secondary to non-adherence to medication regimen. PT's symptoms and blood sugar improved after NS bolus.  EKG negative for any wide complexes or delta waves. CXR is negative for acute abnormality. Pt is well-appearing, in no acute distress and vital signs reviewed and not concerning. She appears safe to be discharged.  Discharge include resources to establish care with a PCP. Return precautions provided. Pt aware of plan and in agreement.    Filed Vitals:   06/20/14 2115 06/20/14 2130 06/20/14 2145 06/20/14 2200  BP: 131/84 137/75 142/72 137/78  Pulse: 79 79 77 81  Temp:      TempSrc:      Resp: Height:      Weight:      SpO2: 100% 99% 100% 100%   Meds given in ED:  Medications  sodium chloride 0.9 % bolus  1,000 mL (0 mLs Intravenous Stopped 06/20/14 2049)  sodium chloride 0.9 % bolus 1,000 mL (0 mLs Intravenous Stopped 06/20/14 2213)    Discharge Medication List as of 06/20/2014  9:46 PM         Harle Battiest, NP 06/22/14 2210  Rolland Porter, MD 07/03/14 1236

## 2014-06-20 NOTE — Discharge Instructions (Signed)
Please follow the directions provided.  Use the referral or the resource guide to establish care with a primary care doctor for follow--up and for further management of your diabetes.  Please take all medicines as prescribed.  Don't hesitate to return for any new, worsening or concerning symptoms.      SEEK IMMEDIATE MEDICAL CARE IF:  You have a severe headache.  You have unusual pain in the chest, abdomen, or back.  You are bleeding from your mouth or rectum, or you have black or tarry stool.  You have an irregular or very fast heartbeat.  You have pain with breathing.  You have repeated fainting or seizure-like jerking during an episode.  You faint when sitting or lying down.  You have confusion.  You have trouble walking.  You have severe weakness.  You have vision problems. If you fainted, call your local emergency services (911 in [place] Do not drive yourself to the hospital.

## 2014-06-20 NOTE — ED Notes (Signed)
Pt reports singing today, got hot and had syncopal episode. Denies any cp or sob, just feeling fatigued now.

## 2014-08-02 ENCOUNTER — Ambulatory Visit (INDEPENDENT_AMBULATORY_CARE_PROVIDER_SITE_OTHER): Payer: Self-pay | Admitting: *Deleted

## 2014-08-02 VITALS — BP 133/88 | HR 108 | Ht 64.0 in | Wt 201.7 lb

## 2014-08-02 DIAGNOSIS — Z139 Encounter for screening, unspecified: Secondary | ICD-10-CM

## 2014-08-02 DIAGNOSIS — N938 Other specified abnormal uterine and vaginal bleeding: Secondary | ICD-10-CM

## 2014-08-02 DIAGNOSIS — Z111 Encounter for screening for respiratory tuberculosis: Secondary | ICD-10-CM

## 2014-08-02 NOTE — Progress Notes (Signed)
Patient also requests TB skin test today as well

## 2014-08-04 ENCOUNTER — Encounter: Payer: Self-pay | Admitting: *Deleted

## 2014-08-23 ENCOUNTER — Other Ambulatory Visit: Payer: Self-pay

## 2014-10-18 ENCOUNTER — Ambulatory Visit (INDEPENDENT_AMBULATORY_CARE_PROVIDER_SITE_OTHER): Payer: Self-pay

## 2014-10-18 VITALS — BP 125/79 | HR 79 | Wt 197.5 lb

## 2014-10-18 DIAGNOSIS — N938 Other specified abnormal uterine and vaginal bleeding: Secondary | ICD-10-CM

## 2014-10-18 MED ORDER — MEDROXYPROGESTERONE ACETATE 150 MG/ML IM SUSP
150.0000 mg | Freq: Once | INTRAMUSCULAR | Status: AC
  Administered 2014-10-18: 150 mg via INTRAMUSCULAR

## 2015-01-03 ENCOUNTER — Ambulatory Visit (INDEPENDENT_AMBULATORY_CARE_PROVIDER_SITE_OTHER): Payer: Self-pay | Admitting: *Deleted

## 2015-01-03 DIAGNOSIS — Z3042 Encounter for surveillance of injectable contraceptive: Secondary | ICD-10-CM

## 2015-01-03 MED ORDER — MEDROXYPROGESTERONE ACETATE 150 MG/ML IM SUSP
150.0000 mg | Freq: Once | INTRAMUSCULAR | Status: AC
  Administered 2015-01-03: 150 mg via INTRAMUSCULAR

## 2015-03-21 ENCOUNTER — Ambulatory Visit: Payer: Self-pay

## 2015-03-23 ENCOUNTER — Ambulatory Visit (INDEPENDENT_AMBULATORY_CARE_PROVIDER_SITE_OTHER): Payer: Self-pay | Admitting: General Practice

## 2015-03-23 VITALS — BP 128/74 | HR 77 | Temp 97.7°F | Ht 64.0 in | Wt 191.0 lb

## 2015-03-23 DIAGNOSIS — N938 Other specified abnormal uterine and vaginal bleeding: Secondary | ICD-10-CM

## 2015-03-23 MED ORDER — MEDROXYPROGESTERONE ACETATE 150 MG/ML IM SUSP
150.0000 mg | Freq: Once | INTRAMUSCULAR | Status: AC
  Administered 2015-03-23: 150 mg via INTRAMUSCULAR

## 2015-04-09 ENCOUNTER — Emergency Department (HOSPITAL_COMMUNITY)
Admission: EM | Admit: 2015-04-09 | Discharge: 2015-04-10 | Disposition: A | Discharge status: Home / Self Care | Payer: Self-pay | Attending: Emergency Medicine | Admitting: Emergency Medicine

## 2015-04-09 ENCOUNTER — Encounter (HOSPITAL_COMMUNITY): Payer: Self-pay | Admitting: Emergency Medicine

## 2015-04-09 DIAGNOSIS — B349 Viral infection, unspecified: Secondary | ICD-10-CM | POA: Insufficient documentation

## 2015-04-09 DIAGNOSIS — F329 Major depressive disorder, single episode, unspecified: Secondary | ICD-10-CM | POA: Insufficient documentation

## 2015-04-09 DIAGNOSIS — E1165 Type 2 diabetes mellitus with hyperglycemia: Secondary | ICD-10-CM | POA: Insufficient documentation

## 2015-04-09 DIAGNOSIS — Z3202 Encounter for pregnancy test, result negative: Secondary | ICD-10-CM | POA: Insufficient documentation

## 2015-04-09 DIAGNOSIS — Z793 Long term (current) use of hormonal contraceptives: Secondary | ICD-10-CM | POA: Insufficient documentation

## 2015-04-09 DIAGNOSIS — F410 Panic disorder [episodic paroxysmal anxiety] without agoraphobia: Secondary | ICD-10-CM | POA: Insufficient documentation

## 2015-04-09 DIAGNOSIS — R350 Frequency of micturition: Secondary | ICD-10-CM | POA: Insufficient documentation

## 2015-04-09 DIAGNOSIS — Z8719 Personal history of other diseases of the digestive system: Secondary | ICD-10-CM | POA: Insufficient documentation

## 2015-04-09 DIAGNOSIS — E669 Obesity, unspecified: Secondary | ICD-10-CM | POA: Insufficient documentation

## 2015-04-09 LAB — COMPREHENSIVE METABOLIC PANEL
ALK PHOS: 89 U/L (ref 38–126)
ALT: 12 U/L — ABNORMAL LOW (ref 14–54)
ANION GAP: 13 (ref 5–15)
AST: 15 U/L (ref 15–41)
Albumin: 3.7 g/dL (ref 3.5–5.0)
BILIRUBIN TOTAL: 0.6 mg/dL (ref 0.3–1.2)
BUN: 10 mg/dL (ref 6–20)
CALCIUM: 9.8 mg/dL (ref 8.9–10.3)
CO2: 22 mmol/L (ref 22–32)
Chloride: 100 mmol/L — ABNORMAL LOW (ref 101–111)
Creatinine, Ser: 0.73 mg/dL (ref 0.44–1.00)
GFR calc Af Amer: >60 mL/min (ref >60)
GFR calc non Af Amer: >60 mL/min (ref >60)
GLUCOSE: 342 mg/dL — AB (ref 65–99)
POTASSIUM: 3.9 mmol/L (ref 3.5–5.1)
Sodium: 135 mmol/L (ref 135–145)
TOTAL PROTEIN: 7 g/dL (ref 6.5–8.1)

## 2015-04-09 LAB — URINALYSIS, ROUTINE W REFLEX MICROSCOPIC
BILIRUBIN URINE: NEGATIVE
KETONES UR: 15 mg/dL — AB
Nitrite: NEGATIVE
PH: 5 (ref 5.0–8.0)
Protein, ur: NEGATIVE mg/dL
SPECIFIC GRAVITY, URINE: 1.042 — AB (ref 1.005–1.030)

## 2015-04-09 LAB — CBC
HEMATOCRIT: 43.1 % (ref 36.0–46.0)
HEMOGLOBIN: 14.1 g/dL (ref 12.0–15.0)
MCH: 29.6 pg (ref 26.0–34.0)
MCHC: 32.7 g/dL (ref 30.0–36.0)
MCV: 90.5 fL (ref 78.0–100.0)
Platelets: 329 10*3/uL (ref 150–400)
RBC: 4.76 MIL/uL (ref 3.87–5.11)
RDW: 12.4 % (ref 11.5–15.5)
WBC: 9.5 10*3/uL (ref 4.0–10.5)

## 2015-04-09 LAB — URINE MICROSCOPIC-ADD ON: RBC / HPF: NONE SEEN RBC/hpf (ref 0–5)

## 2015-04-09 LAB — CBG MONITORING, ED: Glucose-Capillary: 310 mg/dL — ABNORMAL HIGH (ref 65–99)

## 2015-04-09 LAB — LIPASE, BLOOD: Lipase: 27 U/L (ref 11–51)

## 2015-04-09 NOTE — ED Notes (Signed)
Patient here with complaint of body aches, nausea, cough, and fatigue for 4 days. Denies emesis, urinary symptoms, and abdominal pain.

## 2015-04-10 LAB — PREGNANCY, URINE: PREG TEST UR: NEGATIVE

## 2015-04-10 MED ORDER — KETOROLAC TROMETHAMINE 30 MG/ML IJ SOLN
30.0000 mg | Freq: Once | INTRAMUSCULAR | Status: AC
  Administered 2015-04-10: 30 mg via INTRAVENOUS
  Filled 2015-04-10: qty 1

## 2015-04-10 MED ORDER — GABAPENTIN 600 MG PO TABS: 300.0000 mg | ORAL_TABLET | Freq: Once | ORAL | Status: DC

## 2015-04-10 MED ORDER — ONDANSETRON HCL 4 MG/2ML IJ SOLN
4.0000 mg | Freq: Once | INTRAMUSCULAR | Status: AC
  Administered 2015-04-10: 4 mg via INTRAVENOUS
  Filled 2015-04-10: qty 2

## 2015-04-10 MED ORDER — IBUPROFEN 800 MG PO TABS: 800.0000 mg | ORAL_TABLET | Freq: Three times a day (TID) | ORAL | Status: DC | PRN

## 2015-04-10 MED ORDER — SODIUM CHLORIDE 0.9 % IV BOLUS (SEPSIS)
1000.0000 mL | Freq: Once | INTRAVENOUS | Status: AC
  Administered 2015-04-10: 1000 mL via INTRAVENOUS

## 2015-04-10 MED ORDER — GABAPENTIN 300 MG PO CAPS
300.0000 mg | ORAL_CAPSULE | Freq: Once | ORAL | Status: AC
  Administered 2015-04-10: 300 mg via ORAL
  Filled 2015-04-10: qty 1

## 2015-04-10 MED ORDER — ONDANSETRON 4 MG PO TBDP: 4.0000 mg | ORAL_TABLET | Freq: Three times a day (TID) | ORAL | Status: DC | PRN

## 2015-04-10 NOTE — ED Provider Notes (Signed)
By signing my name below, I, Arlan Organ, attest that this documentation has been prepared under the direction and in the presence of Jerron Niblack N Quashaun Lazalde, DO.  Electronically Signed: Arlan Organ, ED Scribe. 04/10/2015. 1:09 AM.   TIME SEEN: 1:04 AM   CHIEF COMPLAINT:  Chief Complaint  Patient presents with  . Generalized Body Aches  . Nausea     HPI:  HPI Comments: Emily [name] is a 40 y.o. female with a PMHx of DM and neuropathy who presents to the Emergency Department complaining of constant, ongoing generalized body aches x 5 days. Pt also reports chills, nausea, cough, urinary frequency, lightheadedness, worsening neuropathy discomfort to feet bilaterally, and fatigue. No aggravating or alleviating factors at this time. OTC Alka-Seltzer attempted prior to arrival without any improvement. She denies any fever, dysuria, hematuria, vomiting, or diarrhea. Pt did not receive her Flu vaccine yet this evening. She states she plans to see her PCP within the next 2 weeks.  PCP: Gwenyth Bender, MD    ROS: See HPI Constitutional: no fever. Positive chills   Eyes: no drainage  ENT: no runny nose   Cardiovascular:  no chest pain  Resp: no SOB. Positive cough GI: no vomiting or diarrhea. Positive nausea GU: no dysuria. Positive urinary frequency  Integumentary: no rash  Allergy: no hives  Musculoskeletal: no leg swelling. Positive myalgias  Neurological: no slurred speech. Positive lightheadedness  ROS otherwise negative  PAST MEDICAL HISTORY/PAST SURGICAL HISTORY:  Past Medical History  Diagnosis Date  . Obesity, unspecified   . PANIC DISORDER   . ANXIETY   . DEPRESSION   . DIABETES MELLITUS, TYPE II dx 04/2009  . GERD     MEDICATIONS:  Prior to Admission medications   Medication Sig Start Date End Date Taking? Authorizing Provider  albuterol (PROVENTIL HFA;VENTOLIN HFA) 108 (90 BASE) MCG/ACT inhaler Inhale 2 puffs into the lungs every 4 (four) hours as needed for wheezing.    Yes Historical Provider, MD  medroxyPROGESTERone (DEPO-PROVERA) 150 MG/ML injection Inject 150 mg into the muscle every 3 (three) months.     Yes Historical Provider, MD  LORazepam (ATIVAN) 0.5 MG tablet Take 1 tablet (0.5 mg total) by mouth 3 (three) times daily as needed for anxiety. 05/14/13   Garlon Hatchet, PA-C  traMADol (ULTRAM) 50 MG tablet Take 1 tablet (50 mg total) by mouth every 6 (six) hours as needed. 05/14/13   Garlon Hatchet, PA-C    ALLERGIES:  Allergies  Allergen Reactions  . Zolpidem Tartrate     Hallucinations lasting almost 24 hrs    SOCIAL HISTORY:  Social History  Substance Use Topics  . Smoking status: Never Smoker   . Smokeless tobacco: Never Used     Comment: Has 79 yt old son Dorma Russell who lives with her. (Autism & ADD) Work at [employer]  . Alcohol Use: No    FAMILY HISTORY: Family History  Problem Relation Age of Onset  . Sarcoidosis Mother 32  . Asthma Mother   . Hyperlipidemia Mother   . Hypertension Mother   . Coronary artery disease Father     Had CABG, and angioplasty around age 54  . Diabetes Father   . Pulmonary fibrosis Sister 30  . Asthma Sister     EXAM: BP 119/71 mmHg  Pulse 80  Temp(Src) 97.7 F (36.5 C) (Oral)  Resp 14  Ht  (1.626 m)  Wt 190 lb (86.183 kg)  BMI 32.60 kg/m2  SpO2 98% CONSTITUTIONAL:  Alert and oriented and responds appropriately to questions. Well-appearing; well-nourished, afebrile and nontoxic HEAD: Normocephalic EYES: Conjunctivae clear, PERRL ENT: normal nose; no rhinorrhea; moist mucous membranes; pharynx without lesions noted NECK: Supple, no meningismus, no LAD  CARD: RRR; S1 and S2 appreciated; no murmurs, no clicks, no rubs, no gallops RESP: Normal chest excursion without splinting or tachypnea; breath sounds clear and equal bilaterally; no wheezes, no rhonchi, no rales, no hypoxia or respiratory distress, speaking full sentences ABD/GI: Normal bowel sounds; non-distended; soft, non-tender,  no rebound, no guarding, no peritoneal signs BACK:  The back appears normal and is non-tender to palpation, there is no CVA tenderness EXT: Normal ROM in all joints; non-tender to palpation; no edema; normal capillary refill; no cyanosis, no calf tenderness or swelling. 2 plus DP pulses bilaterally  SKIN: Normal color for age and race; warm NEURO: Moves all extremities equally, sensation to light touch intact diffusely, cranial nerves II through XII intact PSYCH: The patient's mood and manner are appropriate. Grooming and personal hygiene are appropriate.  MEDICAL DECISION MAKING: Patient here with complaints of likely viral illness. Reports sneezing, dry cough, nausea, body aches. Symptoms present for several days. Outside of any treatment window for Tamiflu if this was influenza. She is afebrile and nontoxic. No vomiting or diarrhea. Eating and drinking without difficulty. Patient's labs are unremarkable. Urine shows hemoglobin, leukocytes and rare bacteria but she has no urinary symptoms. Have offered to treat patient symptomatically with oral medications versus IV and she chooses IV. We'll give IV fluids, Toradol, Zofran and reassess. She does report some increasing neuropathic pain in her feet. We'll give dose of gabapentin.  ED PROGRESS: Patient reports feeling better. She states she has follow-up with her PCP and will discuss with them starting gabapentin for her chronic neuropathy. Her feet are warm and well-perfused. She is neurologically intact. I do not feel she needs to be on antibiotics at this time. She is mildly hyperglycemic but not in DKA. Have recommended close follow-up with her PCP and close monitoring of her blood glucose at home. She verbalizes understanding and is comfortable with this plan.    I personally performed the services described in this documentation, which was scribed in my presence. The recorded information has been reviewed and is accurate.   Layla Maw Ingvald Theisen,  DO 04/10/15 [phone]

## 2015-04-10 NOTE — Discharge Instructions (Signed)
Possible Influenza, Adult or other viral illness Influenza ("the flu") is a viral infection of the respiratory tract. It occurs more often in winter months because people spend more time in close contact with one another. Influenza can make you feel very sick. Influenza easily spreads from person to person (contagious). CAUSES  Influenza is caused by a virus that infects the respiratory tract. You can catch the virus by breathing in droplets from an infected person's cough or sneeze. You can also catch the virus by touching something that was recently contaminated with the virus and then touching your mouth, nose, or eyes. RISKS AND COMPLICATIONS You may be at risk for a more severe case of influenza if you smoke cigarettes, have diabetes, have chronic heart disease (such as heart failure) or lung disease (such as asthma), or if you have a weakened immune system. Elderly people and pregnant women are also at risk for more serious infections. The most common problem of influenza is a lung infection (pneumonia). Sometimes, this problem can require emergency medical care and may be life threatening. SIGNS AND SYMPTOMS  Symptoms typically last 4 to 10 days and may include:  Fever.  Chills.  Headache, body aches, and muscle aches.  Sore throat.  Chest discomfort and cough.  Poor appetite.  Weakness or feeling tired.  Dizziness.  Nausea or vomiting. DIAGNOSIS  Diagnosis of influenza is often made based on your history and a physical exam. A nose or throat swab test can be done to confirm the diagnosis. TREATMENT  In mild cases, influenza goes away on its own. Treatment is directed at relieving symptoms. For more severe cases, your health care provider may prescribe antiviral medicines to shorten the sickness. Antibiotic medicines are not effective because the infection is caused by a virus, not by bacteria. HOME CARE INSTRUCTIONS  Take medicines only as directed by your health care  provider.  Use a cool mist humidifier to make breathing easier.  Get plenty of rest until your temperature returns to normal. This usually takes 3 to 4 days.  Drink enough fluid to keep your urine clear or pale yellow.  Cover yourmouth and nosewhen coughing or sneezing,and wash your handswellto prevent thevirusfrom spreading.  Stay homefromwork orschool untilthe fever is gonefor at least 31full day. PREVENTION  An annual influenza vaccination (flu shot) is the best way to avoid getting influenza. An annual flu shot is now routinely recommended for all adults in the [place] SEEK MEDICAL CARE IF:  You experiencechest pain, yourcough worsens,or you producemore mucus.  Youhave nausea,vomiting, ordiarrhea.  Your fever returns or gets worse. SEEK IMMEDIATE MEDICAL CARE IF:  You havetrouble breathing, you become short of breath,or your skin ornails becomebluish.  You have severe painor stiffnessin the neck.  You develop a sudden headache, or pain in the face or ear.  You have nausea or vomiting that you cannot control. MAKE SURE YOU:   Understand these instructions.  Will watch your condition.  Will get help right away if you are not doing well or get worse.   This information is not intended to replace advice given to you by your health care provider. Make sure you discuss any questions you have with your health care provider.   Document Released: 02/10/2000 Document Revised: 03/05/2014 Document Reviewed: 05/14/2011 Elsevier Interactive Patient Education [employer]

## 2015-04-10 NOTE — ED Notes (Signed)
Pt also reporting some neuropathy in bilateral feet, worse at night. OTC treatments not effective. Requesting alterative measures.

## 2015-04-10 NOTE — ED Notes (Signed)
Patient left at this time with all belongings. 

## 2015-04-11 LAB — URINE CULTURE: Culture: >=100000

## 2015-06-06 ENCOUNTER — Ambulatory Visit (INDEPENDENT_AMBULATORY_CARE_PROVIDER_SITE_OTHER): Payer: Self-pay

## 2015-06-06 VITALS — BP 114/61 | HR 78 | Temp 99.1°F

## 2015-06-06 DIAGNOSIS — Z3042 Encounter for surveillance of injectable contraceptive: Secondary | ICD-10-CM

## 2015-06-06 DIAGNOSIS — N938 Other specified abnormal uterine and vaginal bleeding: Secondary | ICD-10-CM

## 2015-06-06 NOTE — Progress Notes (Signed)
Pt presents to clinic for depo-provera given in left arm IM.

## 2015-06-13 ENCOUNTER — Emergency Department (HOSPITAL_COMMUNITY): Payer: Self-pay

## 2015-06-13 ENCOUNTER — Encounter (HOSPITAL_COMMUNITY): Payer: Self-pay

## 2015-06-13 ENCOUNTER — Emergency Department (HOSPITAL_COMMUNITY)
Admission: EM | Admit: 2015-06-13 | Discharge: 2015-06-13 | Disposition: A | Discharge status: Home / Self Care | Payer: Self-pay | Attending: Emergency Medicine | Admitting: Emergency Medicine

## 2015-06-13 DIAGNOSIS — F410 Panic disorder [episodic paroxysmal anxiety] without agoraphobia: Secondary | ICD-10-CM | POA: Insufficient documentation

## 2015-06-13 DIAGNOSIS — E119 Type 2 diabetes mellitus without complications: Secondary | ICD-10-CM | POA: Insufficient documentation

## 2015-06-13 DIAGNOSIS — Z79899 Other long term (current) drug therapy: Secondary | ICD-10-CM | POA: Insufficient documentation

## 2015-06-13 DIAGNOSIS — E669 Obesity, unspecified: Secondary | ICD-10-CM | POA: Insufficient documentation

## 2015-06-13 DIAGNOSIS — J069 Acute upper respiratory infection, unspecified: Secondary | ICD-10-CM | POA: Insufficient documentation

## 2015-06-13 DIAGNOSIS — Z8719 Personal history of other diseases of the digestive system: Secondary | ICD-10-CM | POA: Insufficient documentation

## 2015-06-13 DIAGNOSIS — F329 Major depressive disorder, single episode, unspecified: Secondary | ICD-10-CM | POA: Insufficient documentation

## 2015-06-13 MED ORDER — BENZONATATE 100 MG PO CAPS: 100.0000 mg | ORAL_CAPSULE | Freq: Three times a day (TID) | ORAL | Status: DC

## 2015-06-13 NOTE — Discharge Instructions (Signed)

## 2015-06-13 NOTE — ED Notes (Signed)
Pt c/o increasing cough and SOB w/ exertion x 3 days.  Pain score 5/10.  Chest soreness w/ coughing.  Pt is noted to be hoarse.

## 2015-06-13 NOTE — ED Provider Notes (Signed)
CSN: [id number]     Arrival date & time 06/13/15  1159 History  By signing my name below, I, Placido Sou, attest that this documentation has been prepared under the direction and in the presence of [employer], PA-C. Electronically Signed: Placido Sou, ED Scribe. 06/13/2015. 3:10 PM.   Chief Complaint  Patient presents with  . Cough   The history is provided by the patient. No language interpreter was used.    HPI Comments: Emily [name] is a 40 y.o. female who presents to the Emergency Department complaining of constant, moderate, dry cough x 3 days. She reports associated, mild, SOB that worsens with coughing and exertion, loss of her voice and sneezing. 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 Pt denies fevers, chills or any other associated symptoms at this time.    Past Medical History  Diagnosis Date  . Obesity, unspecified   . PANIC DISORDER   . ANXIETY   . DEPRESSION   . DIABETES MELLITUS, TYPE II dx 04/2009  . GERD    Past Surgical History  Procedure Laterality Date  . No past surgeries    . Spine surgery  07/20/2010   Family History  Problem Relation Age of Onset  . Sarcoidosis Mother 50  . Asthma Mother   . Hyperlipidemia Mother   . Hypertension Mother   . Coronary artery disease Father     Had CABG, and angioplasty around age 11  . Diabetes Father   . Pulmonary fibrosis Sister 30  . Asthma Sister    Social History  Substance Use Topics  . Smoking status: Never Smoker   . Smokeless tobacco: Never Used     Comment: Has 82 yt old son Dorma Russell who lives with her. (Autism & ADD) Work at [employer]  . Alcohol Use: No   OB History    Gravida Para Term Preterm AB TAB SAB Ectopic Multiple Living   Review of Systems A complete 10 system review of systems was obtained and all systems are negative except as noted in the HPI and PMH.    Allergies  Zolpidem tartrate  Home Medications   Prior to Admission medications    Medication Sig Start Date End Date Taking? Authorizing Provider  albuterol (PROVENTIL HFA;VENTOLIN HFA) 108 (90 BASE) MCG/ACT inhaler Inhale 2 puffs into the lungs every 4 (four) hours as needed for wheezing.    Historical Provider, MD  benzonatate (TESSALON) 100 MG capsule Take 1 capsule (100 mg total) by mouth every 8 (eight) hours. 06/13/15   Eyvonne Mechanic, PA-C  ibuprofen (ADVIL,MOTRIN) 800 MG tablet Take 1 tablet (800 mg total) by mouth every 8 (eight) hours as needed for mild pain. 04/10/15   Kristen N Ward, DO  LORazepam (ATIVAN) 0.5 MG tablet Take 1 tablet (0.5 mg total) by mouth 3 (three) times daily as needed for anxiety. 05/14/13   Garlon Hatchet, PA-C  medroxyPROGESTERone (DEPO-PROVERA) 150 MG/ML injection Inject 150 mg into the muscle every 3 (three) months.      Historical Provider, MD  ondansetron (ZOFRAN ODT) 4 MG disintegrating tablet Take 1 tablet (4 mg total) by mouth every 8 (eight) hours as needed for nausea or vomiting. 04/10/15   Kristen N Ward, DO  traMADol (ULTRAM) 50 MG tablet Take 1 tablet (50 mg total) by mouth every 6 (six) hours as needed. 05/14/13   Garlon Hatchet, PA-C   BP 138/88 mmHg  Pulse 92  Temp(Src) 98.2 F (36.8 C) (Oral)  Resp 20  SpO2 100%    Physical Exam  Constitutional: She is oriented to person, place, and time. She appears well-developed and well-nourished.  HENT:  Head: Normocephalic and atraumatic.  Right Ear: Tympanic membrane, external ear and ear canal normal.  Left Ear: Tympanic membrane, external ear and ear canal normal.  Mouth/Throat: Uvula is midline and mucous membranes are normal. Posterior oropharyngeal erythema present. No oropharyngeal exudate, posterior oropharyngeal edema or tonsillar abscesses.  Eyes: EOM are normal.  Neck: Normal range of motion.  Cardiovascular: Normal rate.   Pulmonary/Chest: Effort normal. No respiratory distress.  Abdominal: Soft.  Musculoskeletal: Normal range of motion.  Neurological: She is alert and  oriented to person, place, and time.  Skin: Skin is warm and dry.  Psychiatric: She has a normal mood and affect.  Nursing note and vitals reviewed.   ED Course  Procedures  DIAGNOSTIC STUDIES: Oxygen Saturation is 98% on RA, normal by my interpretation.    COORDINATION OF CARE: 2:57 PM Discussed next steps with pt. She verbalized understanding and is agreeable with the plan.   Labs Review Labs Reviewed - No data to display  Imaging Review Dg Chest 2 View  06/13/2015  CLINICAL DATA:  Cough, shortness of breath, and chest pressure for 2 days. EXAM: CHEST  2 VIEW COMPARISON:  06/20/2014 FINDINGS: The heart size and mediastinal contours are within normal limits. Both lungs are clear. The visualized skeletal structures are unremarkable. Is the IMPRESSION: No active cardiopulmonary disease. Electronically Signed   By: Francene BoyersJames  Maxwell M.D.   On: 06/13/2015 12:58   I have personally reviewed and evaluated these images as part of my medical decision-making.   EKG Interpretation None      MDM   Final diagnoses:  URI (upper respiratory infection)    Labs: none indicated  Imaging: CXR  Consults: none  Therapeutics: Tessalon   Assessment: 40 year old female presents today with likely viral URI. No significant findings on exam that would necessitate further evaluation or management here in the ED. She is afebrile, nontoxic in no acute distress. Patient will be discharged home with symptomatic care instructions strict return precautions.  Plan: Pt given strict return precautions, verbalized understanding and agreement to today's plan and had no further questions or concerns at the time of discharge.     I personally performed the services described in this documentation, which was scribed in my presence. The recorded information has been reviewed and is accurate.    Eyvonne MechanicJeffrey Aarush Stukey, PA-C 06/13/15 1805  Lorre NickAnthony Allen, MD 06/17/15 [id number]

## 2015-07-18 ENCOUNTER — Encounter (HOSPITAL_COMMUNITY): Payer: Self-pay | Admitting: Emergency Medicine

## 2015-07-18 ENCOUNTER — Emergency Department (HOSPITAL_COMMUNITY)
Admission: EM | Admit: 2015-07-18 | Discharge: 2015-07-19 | Disposition: A | Discharge status: Home / Self Care | Payer: Self-pay | Attending: Emergency Medicine | Admitting: Emergency Medicine

## 2015-07-18 ENCOUNTER — Emergency Department (HOSPITAL_COMMUNITY): Payer: Self-pay

## 2015-07-18 DIAGNOSIS — Z7984 Long term (current) use of oral hypoglycemic drugs: Secondary | ICD-10-CM | POA: Insufficient documentation

## 2015-07-18 DIAGNOSIS — E119 Type 2 diabetes mellitus without complications: Secondary | ICD-10-CM | POA: Insufficient documentation

## 2015-07-18 DIAGNOSIS — R51 Headache: Secondary | ICD-10-CM | POA: Insufficient documentation

## 2015-07-18 DIAGNOSIS — R519 Headache, unspecified: Secondary | ICD-10-CM

## 2015-07-18 DIAGNOSIS — Z79899 Other long term (current) drug therapy: Secondary | ICD-10-CM | POA: Insufficient documentation

## 2015-07-18 DIAGNOSIS — Z791 Long term (current) use of non-steroidal anti-inflammatories (NSAID): Secondary | ICD-10-CM | POA: Insufficient documentation

## 2015-07-18 DIAGNOSIS — I10 Essential (primary) hypertension: Secondary | ICD-10-CM | POA: Insufficient documentation

## 2015-07-18 DIAGNOSIS — H02402 Unspecified ptosis of left eyelid: Secondary | ICD-10-CM | POA: Insufficient documentation

## 2015-07-18 HISTORY — DX: Essential (primary) hypertension: I10

## 2015-07-18 LAB — CBC WITH DIFFERENTIAL/PLATELET
BASOS ABS: 0 10*3/uL (ref 0.0–0.1)
Basophils Relative: 0 %
Eosinophils Absolute: 0.1 10*3/uL (ref 0.0–0.7)
Eosinophils Relative: 1 %
HCT: 39.5 % (ref 36.0–46.0)
HEMOGLOBIN: 13.7 g/dL (ref 12.0–15.0)
LYMPHS ABS: 4 10*3/uL (ref 0.7–4.0)
LYMPHS PCT: 39 %
MCH: 30.2 pg (ref 26.0–34.0)
MCHC: 34.7 g/dL (ref 30.0–36.0)
MCV: 87 fL (ref 78.0–100.0)
Monocytes Absolute: 0.5 10*3/uL (ref 0.1–1.0)
Monocytes Relative: 5 %
NEUTROS ABS: 5.7 10*3/uL (ref 1.7–7.7)
NEUTROS PCT: 55 %
PLATELETS: 324 10*3/uL (ref 150–400)
RBC: 4.54 MIL/uL (ref 3.87–5.11)
RDW: 12.4 % (ref 11.5–15.5)
WBC: 10.3 10*3/uL (ref 4.0–10.5)

## 2015-07-18 LAB — I-STAT CHEM 8, ED
BUN: 7 mg/dL (ref 6–20)
CALCIUM ION: 1.19 mmol/L (ref 1.12–1.23)
CHLORIDE: 100 mmol/L — AB (ref 101–111)
Creatinine, Ser: 0.5 mg/dL (ref 0.44–1.00)
GLUCOSE: 204 mg/dL — AB (ref 65–99)
HCT: 45 % (ref 36.0–46.0)
Hemoglobin: 15.3 g/dL — ABNORMAL HIGH (ref 12.0–15.0)
POTASSIUM: 3 mmol/L — AB (ref 3.5–5.1)
Sodium: 140 mmol/L (ref 135–145)
TCO2: 23 mmol/L (ref 0–100)

## 2015-07-18 MED ORDER — MECLIZINE HCL 25 MG PO TABS
50.0000 mg | ORAL_TABLET | ORAL | Status: AC
  Administered 2015-07-18: 50 mg via ORAL
  Filled 2015-07-18: qty 2

## 2015-07-18 MED ORDER — IOPAMIDOL (ISOVUE-300) INJECTION 61%
100.0000 mL | Freq: Once | INTRAVENOUS | Status: AC | PRN
  Administered 2015-07-18: 75 mL via INTRAVENOUS

## 2015-07-18 NOTE — ED Notes (Addendum)
Patient states she had left-eye blurred vision starting this morning at 0700  that has progressed throughout the day. Reports headache last night. States "feels like something is in my eye and I cannot open it" Patient alert, oriented, ambulatory, speaking in full sentences,

## 2015-07-18 NOTE — ED Notes (Signed)
This Clinical research associatewriter called into room after patient had apparent syncopal episode. Patient A&O x4, neurologically intact, no facial droop, no drift in upper or lower extremities, no decreased sensation. Patient unable to open left eye completely, redness noted to same. When patient is asked what happened before syncopal episode, patient states "I had a panic attack". Patient in NAD currently, will continue to monitor.

## 2015-07-18 NOTE — ED Notes (Signed)
Patient ambulatory to restroom with minimal assistance

## 2015-07-18 NOTE — ED Notes (Signed)
Charge RN notified of patient's status.

## 2015-07-18 NOTE — ED Provider Notes (Signed)
CSN: [id number]     Arrival date & time 07/18/15  1645 History   First MD Initiated Contact with Patient 07/18/15 1954     Chief Complaint  Patient presents with  . Blurred Vision     (Consider location/radiation/quality/duration/timing/severity/associated sxs/prior Treatment) HPI Comments: She states that yesterday she had a headache that she did not take any medication for it was global in nature.  She did sleep more than normal, and when she woke this morning.  She had no headache, but she noticed a "film" over her left eye which Ms. Herbst vision slightly blurry.  She used some over-the-counter eyedrops which seemed to help.  She was fine until about 1:00 when she noticed that she had some visual disturbance.  Again this evening about 6 she noticed that she could not open her eye fully.  When she manually opens her eye.  Her vision is not involved.  Does have some pain in the eyebrow area.  No known rash.  No recent URI illness.  She states the eye is watering and burning.  Denies any perception problems.  No gait disturbance, although mother states that yesterday when she had the headache.  She was "wobbly and had to hold onto the basket", this resolved shortly with no reoccurrence since.  The history is provided by the patient.    Past Medical History  Diagnosis Date  . Obesity, unspecified   . PANIC DISORDER   . ANXIETY   . DEPRESSION   . DIABETES MELLITUS, TYPE II dx 04/2009  . GERD   . Hypertension    Past Surgical History  Procedure Laterality Date  . No past surgeries    . Spine surgery  07/20/2010   Family History  Problem Relation Age of Onset  . Sarcoidosis Mother 7255  . Asthma Mother   . Hyperlipidemia Mother   . Hypertension Mother   . Coronary artery disease Father     Had CABG, and angioplasty around age 40  . Diabetes Father   . Pulmonary fibrosis Sister 30  . Asthma Sister    Social History  Substance Use Topics  . Smoking status: Never Smoker   .  Smokeless tobacco: Never Used     Comment: Has 339 yt old son [name] who lives with her. (Autism & ADD) Work at [employer]  . Alcohol Use: No   OB History    Gravida Para Term Preterm AB TAB SAB Ectopic Multiple Living   2    1  1   1      Review of Systems  Constitutional: Negative for fever and chills.  Eyes: Positive for itching and visual disturbance. Negative for photophobia and pain.  Respiratory: Negative for shortness of breath.   Genitourinary: Negative for dysuria.  Skin: Negative for rash and wound.  Neurological: Positive for headaches. Negative for dizziness and numbness.  All other systems reviewed and are negative.     Allergies  Zolpidem tartrate  Home Medications   Prior to Admission medications   Medication Sig Start Date End Date Taking? Authorizing Provider  albuterol (PROVENTIL HFA;VENTOLIN HFA) 108 (90 BASE) MCG/ACT inhaler Inhale 2 puffs into the lungs every 4 (four) hours as needed for wheezing.   Yes Historical Provider, MD  DimenhyDRINATE (MOTION SICKNESS PO) Take 1 tablet by mouth every 6 (six) hours as needed (vertigo, diziness).   Yes Historical Provider, MD  gabapentin (NEURONTIN) 300 MG capsule Take 300 mg by mouth 3 (three) times daily. 06/30/15  Yes Historical Provider, MD  glipiZIDE (GLUCOTROL XL) 10 MG 24 hr tablet Take 10 mg by mouth daily. 06/23/15  Yes Historical Provider, MD  hydrochlorothiazide (MICROZIDE) 12.5 MG capsule Take 12.5 mg by mouth daily. 06/23/15  Yes Historical Provider, MD  ibuprofen (ADVIL,MOTRIN) 200 MG tablet Take 200-800 mg by mouth every 6 (six) hours as needed for headache or moderate pain.   Yes Historical Provider, MD  medroxyPROGESTERone (DEPO-PROVERA) 150 MG/ML injection Inject 150 mg into the muscle every 3 (three) months.     Yes Historical Provider, MD  metFORMIN (GLUCOPHAGE) 1000 MG tablet Take 1,000 mg by mouth 2 (two) times daily. 05/27/15  Yes Historical Provider, MD  metoprolol tartrate (LOPRESSOR) 25 MG  tablet Take 25 mg by mouth daily. 06/23/15  Yes Historical Provider, MD  benzonatate (TESSALON) 100 MG capsule Take 1 capsule (100 mg total) by mouth every 8 (eight) hours. Patient not taking: Reported on 07/18/2015 06/13/15   Eyvonne Mechanic, PA-C  ibuprofen (ADVIL,MOTRIN) 800 MG tablet Take 1 tablet (800 mg total) by mouth every 8 (eight) hours as needed for mild pain. Patient not taking: Reported on 07/18/2015 04/10/15   Kristen N Ward, DO  LORazepam (ATIVAN) 0.5 MG tablet Take 1 tablet (0.5 mg total) by mouth 3 (three) times daily as needed for anxiety. Patient not taking: Reported on 07/18/2015 05/14/13   Garlon Hatchet, PA-C  meclizine (ANTIVERT) 25 MG tablet Take 1 tablet (25 mg total) by mouth 3 (three) times daily as needed for dizziness. 07/19/15   Earley Favor, NP  ondansetron (ZOFRAN ODT) 4 MG disintegrating tablet Take 1 tablet (4 mg total) by mouth every 8 (eight) hours as needed for nausea or vomiting. Patient not taking: Reported on 07/18/2015 04/10/15   Kristen N Ward, DO   BP 110/65 mmHg  Pulse 85  Temp(Src) 98.2 F (36.8 C) (Oral)  Resp 18  Ht  (1.626 m)  Wt 85.73 kg  BMI 32.43 kg/m2  SpO2 100% Physical Exam  Constitutional: She is oriented to person, place, and time. She appears well-developed and well-nourished.  HENT:  Head: Normocephalic.  Right Ear: External ear normal.  Left Ear: External ear normal.  Eyes: Pupils are equal, round, and reactive to light. Left eye exhibits no discharge, no exudate and no hordeolum. No foreign body present in the left eye. Left conjunctiva is not injected. Left conjunctiva has no hemorrhage. Left eye exhibits normal extraocular motion and no nystagmus.    Cardiovascular: Normal rate and regular rhythm.   Pulmonary/Chest: Effort normal.  Abdominal: Soft.  Musculoskeletal: Normal range of motion.  Neurological: She is alert and oriented to person, place, and time. No cranial nerve deficit. Coordination normal.  Skin: Skin is warm. No  erythema.  Nursing note and vitals reviewed.   ED Course  Procedures (including critical care time) Labs Review Labs Reviewed  I-STAT CHEM 8, ED - Abnormal; Notable for the following:    Potassium 3.0 (*)    Chloride 100 (*)    Glucose, Bld 204 (*)    Hemoglobin 15.3 (*)    All other components within normal limits  CBC WITH DIFFERENTIAL/PLATELET    Imaging Review Ct Head Wo Contrast  07/18/2015  CLINICAL DATA:  Blurred vision left eye since this morning progressively worse throughout the day. Headaches since last night. EXAM: CT HEAD WITHOUT CT ORBITS WITH CONTRAST TECHNIQUE: Multidetector CT imaging of the head was performed following the standard protocol without intravenous contrast. Multidetector CT imaging of the orbits were  performed following the standard protocol during bolus administration of intravenous contrast. CONTRAST:  75mL ISOVUE-300 IOPAMIDOL (ISOVUE-300) INJECTION 61% COMPARISON:  None. FINDINGS: CT HEAD FINDINGS Ventricles, cisterns and other CSF spaces are within normal. There is no mass, mass effect, shift of midline structures or acute hemorrhage. There is no evidence of acute infarction. CT ORBITS FINDINGS Globes and retrobulbar spaces are normal and symmetric. Borderline exophthalmos. No abnormal enhancement. Remaining bony structures soft tissues within normal. IMPRESSION: No acute intracranial findings. Normal orbits. Electronically Signed   By: Elberta Fortis M.D.   On: 07/18/2015 23:31   Ct Orbits W/cm  07/18/2015  CLINICAL DATA:  Blurred vision left eye since this morning progressively worse throughout the day. Headaches since last night. EXAM: CT HEAD WITHOUT CT ORBITS WITH CONTRAST TECHNIQUE: Multidetector CT imaging of the head was performed following the standard protocol without intravenous contrast. Multidetector CT imaging of the orbits were performed following the standard protocol during bolus administration of intravenous contrast. CONTRAST:  75mL  ISOVUE-300 IOPAMIDOL (ISOVUE-300) INJECTION 61% COMPARISON:  None. FINDINGS: CT HEAD FINDINGS Ventricles, cisterns and other CSF spaces are within normal. There is no mass, mass effect, shift of midline structures or acute hemorrhage. There is no evidence of acute infarction. CT ORBITS FINDINGS Globes and retrobulbar spaces are normal and symmetric. Borderline exophthalmos. No abnormal enhancement. Remaining bony structures soft tissues within normal. IMPRESSION: No acute intracranial findings. Normal orbits. Electronically Signed   By: Elberta Fortis M.D.   On: 07/18/2015 23:31   I have personally reviewed and evaluated these images and lab results as part of my medical decision-making.   EKG Interpretation None      MDM  Concern for periorbital infection, CVA will obtain labs, head and orbital CT CT scans reviewed.  No pathology to explain patient's physical findings.  I did speak with Dr. Lu Duffel the hospital, neurologist.  She does not feel that this is a neurologic emergency.  He feels that she can follow-up in the office for further evaluation.  Doubts myasthenia gravis or an abscess.  I will have patient follow-up with outpatient neurology as well as her ophthalmologist. After the meclizine.  Patient was able to ambulate with a steady her gait Final diagnoses:  Ptosis of eyelid, left  Nonintractable headache, unspecified chronicity pattern, unspecified headache type         Earley Favor, NP 07/19/15 0118  Earley Favor, NP 07/19/15 4098  Alvira Monday, MD 07/20/15 1351

## 2015-07-19 MED ORDER — MECLIZINE HCL 25 MG PO TABS: 25.0000 mg | ORAL_TABLET | Freq: Three times a day (TID) | ORAL | Status: DC | PRN

## 2015-07-19 NOTE — Discharge Instructions (Signed)
No specific cause or your droopy eyelid or ptosis.  Has been identified, tonight the CT scan of your head and orbits is normal.  Labs were are normal.  I've discussed this with our hospital, neurologist.  Recommend that you follow-up outpatient with neurology as well as your ophthalmologist you have been given a prescription for meclizine for the dizzy, woozy feeling that you have been having in your head

## 2015-08-22 ENCOUNTER — Ambulatory Visit (INDEPENDENT_AMBULATORY_CARE_PROVIDER_SITE_OTHER): Payer: Self-pay | Admitting: *Deleted

## 2015-08-22 ENCOUNTER — Encounter: Payer: Self-pay | Admitting: *Deleted

## 2015-08-22 VITALS — BP 125/81 | HR 76 | Wt 191.7 lb

## 2015-08-22 DIAGNOSIS — N938 Other specified abnormal uterine and vaginal bleeding: Secondary | ICD-10-CM

## 2015-08-22 DIAGNOSIS — Z3042 Encounter for surveillance of injectable contraceptive: Secondary | ICD-10-CM

## 2015-11-07 ENCOUNTER — Ambulatory Visit (INDEPENDENT_AMBULATORY_CARE_PROVIDER_SITE_OTHER): Payer: Self-pay | Admitting: *Deleted

## 2015-11-07 DIAGNOSIS — Z3042 Encounter for surveillance of injectable contraceptive: Secondary | ICD-10-CM

## 2015-11-07 MED ORDER — MEDROXYPROGESTERONE ACETATE 150 MG/ML IM SUSP
150.0000 mg | Freq: Once | INTRAMUSCULAR | Status: AC
  Administered 2015-11-07: 150 mg via INTRAMUSCULAR

## 2016-01-23 ENCOUNTER — Ambulatory Visit (INDEPENDENT_AMBULATORY_CARE_PROVIDER_SITE_OTHER): Payer: Self-pay

## 2016-01-23 VITALS — BP 126/80 | HR 72

## 2016-01-23 DIAGNOSIS — Z3042 Encounter for surveillance of injectable contraceptive: Secondary | ICD-10-CM

## 2016-01-23 MED ORDER — MEDROXYPROGESTERONE ACETATE 150 MG/ML IM SUSP
150.0000 mg | Freq: Once | INTRAMUSCULAR | Status: AC
  Administered 2016-01-23: 150 mg via INTRAMUSCULAR

## 2016-01-23 NOTE — Progress Notes (Signed)
Patient presented to the office today for her depo-provera. Patient tolerated well and will follow up between 02/11-02-26-2018. Patient voice understanding at this time.

## 2016-02-28 ENCOUNTER — Encounter (HOSPITAL_COMMUNITY): Payer: Self-pay | Admitting: Family Medicine

## 2016-02-28 ENCOUNTER — Ambulatory Visit (HOSPITAL_COMMUNITY)
Admission: EM | Admit: 2016-02-28 | Discharge: 2016-02-28 | Disposition: A | Discharge status: Home / Self Care | Payer: BLUE CROSS/BLUE SHIELD | Attending: Family Medicine | Admitting: Family Medicine

## 2016-02-28 DIAGNOSIS — J0100 Acute maxillary sinusitis, unspecified: Secondary | ICD-10-CM

## 2016-02-28 MED ORDER — BENZONATATE 100 MG PO CAPS: 100.0000 mg | ORAL_CAPSULE | Freq: Three times a day (TID) | ORAL | 0 refills | Status: DC

## 2016-02-28 MED ORDER — AMOXICILLIN-POT CLAVULANATE 875-125 MG PO TABS: 1.0000 | ORAL_TABLET | Freq: Two times a day (BID) | ORAL | 0 refills | Status: AC

## 2016-02-28 NOTE — ED Triage Notes (Signed)
Pt here with a few weeks of URI symptoms and now with nasal congestion, and left ear fullness. sts pressure in head on the left side.

## 2016-02-28 NOTE — ED Provider Notes (Signed)
CSN: [id number]     Arrival date & time 02/28/16  1754 History   First MD Initiated Contact with Patient 02/28/16 1839     Chief Complaint  Patient presents with  . Otalgia  . Facial Pain   (Consider location/radiation/quality/duration/timing/severity/associated sxs/prior Treatment) 41 year old female presents to clinic with three week history of congestion. States her symptoms started out as a cold three weeks ago and initially improved but over the last 3-4 days they have worsened with increasing sinus pressure and greenish discharge. She also reports having had cough for several days as well and has not had relief with OTC medications. Cough is dry and hacking, without sputum. Moderate and intermittent    The history is provided by the patient.  Otalgia  Associated symptoms: congestion and cough   Associated symptoms: no fever and no sore throat     Past Medical History:  Diagnosis Date  . ANXIETY   . DEPRESSION   . DIABETES MELLITUS, TYPE II dx 04/2009  . GERD   . Hypertension   . Obesity, unspecified   . PANIC DISORDER    Past Surgical History:  Procedure Laterality Date  . NO PAST SURGERIES    . SPINE SURGERY  07/20/2010   Family History  Problem Relation Age of Onset  . Coronary artery disease Father     Had CABG, and angioplasty around age 25  . Diabetes Father   . Sarcoidosis Mother 47  . Asthma Mother   . Hyperlipidemia Mother   . Hypertension Mother   . Pulmonary fibrosis Sister 30  . Asthma Sister    Social History  Substance Use Topics  . Smoking status: Never Smoker  . Smokeless tobacco: Never Used     Comment: Has 35 yt old son Dorma Russell who lives with her. (Autism & ADD) Work at [employer]  . Alcohol use No   OB History    Gravida Para Term Preterm AB Living   2       1 1    SAB TAB Ectopic Multiple Live Births   1             Review of Systems  Constitutional: Negative for chills, fatigue and fever.  HENT: Positive for congestion, ear  pain, sinus pain and sinus pressure. Negative for dental problem and sore throat.   Eyes: Negative.   Respiratory: Positive for cough. Negative for chest tightness, shortness of breath and wheezing.   Cardiovascular: Negative.   Gastrointestinal: Negative.   Musculoskeletal: Negative.   Neurological: Negative.     Allergies  Zolpidem tartrate  Home Medications   Prior to Admission medications   Medication Sig Start Date End Date Taking? Authorizing Provider  albuterol (PROVENTIL HFA;VENTOLIN HFA) 108 (90 BASE) MCG/ACT inhaler Inhale 2 puffs into the lungs every 4 (four) hours as needed for wheezing.    Historical Provider, MD  amoxicillin-clavulanate (AUGMENTIN) 875-125 MG tablet Take 1 tablet by mouth 2 (two) times daily. 02/28/16 03/06/16  Dorena Bodo, NP  benzonatate (TESSALON) 100 MG capsule Take 1 capsule (100 mg total) by mouth every 8 (eight) hours. Patient not taking: Reported on 07/18/2015 06/13/15   Eyvonne Mechanic, PA-C  benzonatate (TESSALON) 100 MG capsule Take 1 capsule (100 mg total) by mouth every 8 (eight) hours. 02/28/16   Dorena Bodo, NP  DimenhyDRINATE (MOTION SICKNESS PO) Take 1 tablet by mouth every 6 (six) hours as needed (vertigo, diziness).    Historical Provider, MD  gabapentin (NEURONTIN) 300 MG capsule  Take 300 mg by mouth 3 (three) times daily. 06/30/15   Historical Provider, MD  glipiZIDE (GLUCOTROL XL) 10 MG 24 hr tablet Take 10 mg by mouth daily. 06/23/15   Historical Provider, MD  hydrochlorothiazide (MICROZIDE) 12.5 MG capsule Take 12.5 mg by mouth daily. 06/23/15   Historical Provider, MD  ibuprofen (ADVIL,MOTRIN) 200 MG tablet Take 200-800 mg by mouth every 6 (six) hours as needed for headache or moderate pain.    Historical Provider, MD  ibuprofen (ADVIL,MOTRIN) 800 MG tablet Take 1 tablet (800 mg total) by mouth every 8 (eight) hours as needed for mild pain. Patient not taking: Reported on 07/18/2015 04/10/15   Kristen N Ward, DO  LORazepam (ATIVAN) 0.5 MG  tablet Take 1 tablet (0.5 mg total) by mouth 3 (three) times daily as needed for anxiety. Patient not taking: Reported on 07/18/2015 05/14/13   Garlon Hatchet, PA-C  meclizine (ANTIVERT) 25 MG tablet Take 1 tablet (25 mg total) by mouth 3 (three) times daily as needed for dizziness. 07/19/15   Earley Favor, NP  medroxyPROGESTERone (DEPO-PROVERA) 150 MG/ML injection Inject 150 mg into the muscle every 3 (three) months.      Historical Provider, MD  metFORMIN (GLUCOPHAGE) 1000 MG tablet Take 1,000 mg by mouth 2 (two) times daily. 05/27/15   Historical Provider, MD  metoprolol tartrate (LOPRESSOR) 25 MG tablet Take 25 mg by mouth daily. 06/23/15   Historical Provider, MD  ondansetron (ZOFRAN ODT) 4 MG disintegrating tablet Take 1 tablet (4 mg total) by mouth every 8 (eight) hours as needed for nausea or vomiting. Patient not taking: Reported on 07/18/2015 04/10/15   Layla Maw Ward, DO   Meds Ordered and Administered this Visit  Medications - No data to display  BP 136/84 (BP Location: Left Arm)   Pulse 95   Temp 98 F (36.7 C) (Oral)   Resp 12   SpO2 100%  No data found.   Physical Exam  Constitutional: She is oriented to person, place, and time. She appears well-developed and well-nourished. No distress.  HENT:  Head: Normocephalic.  Right Ear: Hearing, tympanic membrane and external ear normal.  Left Ear: Hearing, tympanic membrane and external ear normal.  Nose: Right sinus exhibits maxillary sinus tenderness. Left sinus exhibits maxillary sinus tenderness.    Mouth/Throat: No oropharyngeal exudate, posterior oropharyngeal edema or posterior oropharyngeal erythema. Tonsils are 1+ on the right. Tonsils are 1+ on the left.  Eyes: EOM are normal.  Neck: Normal range of motion. Neck supple. No JVD present.  Cardiovascular: Normal rate and regular rhythm.   Pulmonary/Chest: Effort normal and breath sounds normal.  Abdominal: Soft. Bowel sounds are normal.  Lymphadenopathy:    She has cervical  adenopathy.  Neurological: She is alert and oriented to person, place, and time.  Skin: Skin is warm and dry. Capillary refill takes less than 2 seconds. She is not diaphoretic. No pallor.  Psychiatric: She has a normal mood and affect.  Nursing note and vitals reviewed.   Urgent Care Course   Clinical Course     Procedures (including critical care time)  Labs Review Labs Reviewed - No data to display  Imaging Review No results found.   Visual Acuity Review  Right Eye Distance:   Left Eye Distance:   Bilateral Distance:    Right Eye Near:   Left Eye Near:    Bilateral Near:         MDM   1. Acute non-recurrent maxillary sinusitis  Patient given an Rx for Augmentin and for tessalon> advised to rest, drink plenty of fluids, continue the OTC Mucinex. Should symptoms fail to improve follow up with PCP or return to clinic     Dorena BodoLawrence Mac Dowdell, NP 02/28/16 1856

## 2016-02-28 NOTE — Discharge Instructions (Signed)
You have been given a medicine for cough and you have been given an antibiotic for sinusitis. Be sure to take all your antibiotics. Should your symptoms fail to improve or worsen follow up with your primary care provider or return to clinic.

## 2016-03-14 ENCOUNTER — Ambulatory Visit: Payer: Self-pay | Admitting: Internal Medicine

## 2016-03-29 ENCOUNTER — Encounter (HOSPITAL_COMMUNITY): Payer: Self-pay | Admitting: *Deleted

## 2016-03-29 ENCOUNTER — Emergency Department (HOSPITAL_COMMUNITY)
Admission: EM | Admit: 2016-03-29 | Discharge: 2016-03-29 | Disposition: A | Discharge status: Home / Self Care | Payer: BLUE CROSS/BLUE SHIELD | Attending: Emergency Medicine | Admitting: Emergency Medicine

## 2016-03-29 DIAGNOSIS — K529 Noninfective gastroenteritis and colitis, unspecified: Secondary | ICD-10-CM | POA: Diagnosis not present

## 2016-03-29 DIAGNOSIS — R197 Diarrhea, unspecified: Secondary | ICD-10-CM | POA: Diagnosis present

## 2016-03-29 DIAGNOSIS — I10 Essential (primary) hypertension: Secondary | ICD-10-CM | POA: Insufficient documentation

## 2016-03-29 DIAGNOSIS — E119 Type 2 diabetes mellitus without complications: Secondary | ICD-10-CM | POA: Diagnosis not present

## 2016-03-29 DIAGNOSIS — B349 Viral infection, unspecified: Secondary | ICD-10-CM

## 2016-03-29 DIAGNOSIS — Z79899 Other long term (current) drug therapy: Secondary | ICD-10-CM | POA: Diagnosis not present

## 2016-03-29 DIAGNOSIS — Z7984 Long term (current) use of oral hypoglycemic drugs: Secondary | ICD-10-CM | POA: Diagnosis not present

## 2016-03-29 LAB — CBC WITH DIFFERENTIAL/PLATELET
Basophils Absolute: 0 10*3/uL (ref 0.0–0.1)
Basophils Relative: 0 %
Eosinophils Absolute: 0.1 10*3/uL (ref 0.0–0.7)
Eosinophils Relative: 1 %
HEMATOCRIT: 44.1 % (ref 36.0–46.0)
HEMOGLOBIN: 14.7 g/dL (ref 12.0–15.0)
Lymphocytes Relative: 44 %
Lymphs Abs: 3.9 10*3/uL (ref 0.7–4.0)
MCH: 29.7 pg (ref 26.0–34.0)
MCHC: 33.3 g/dL (ref 30.0–36.0)
MCV: 89.1 fL (ref 78.0–100.0)
MONOS PCT: 6 %
Monocytes Absolute: 0.5 10*3/uL (ref 0.1–1.0)
NEUTROS ABS: 4.4 10*3/uL (ref 1.7–7.7)
NEUTROS PCT: 49 %
Platelets: 283 10*3/uL (ref 150–400)
RBC: 4.95 MIL/uL (ref 3.87–5.11)
RDW: 12.6 % (ref 11.5–15.5)
WBC: 9 10*3/uL (ref 4.0–10.5)

## 2016-03-29 LAB — URINALYSIS, ROUTINE W REFLEX MICROSCOPIC
Bilirubin Urine: NEGATIVE
Hgb urine dipstick: NEGATIVE
Ketones, ur: 20 mg/dL — AB
Nitrite: NEGATIVE
Protein, ur: 30 mg/dL — AB
Specific Gravity, Urine: 1.028 (ref 1.005–1.030)
pH: 5 (ref 5.0–8.0)

## 2016-03-29 LAB — PREGNANCY, URINE: Preg Test, Ur: NEGATIVE

## 2016-03-29 LAB — BASIC METABOLIC PANEL
Anion gap: 12 (ref 5–15)
BUN: 10 mg/dL (ref 6–20)
CHLORIDE: 104 mmol/L (ref 101–111)
CO2: 20 mmol/L — AB (ref 22–32)
CREATININE: 0.7 mg/dL (ref 0.44–1.00)
Calcium: 9.8 mg/dL (ref 8.9–10.3)
GFR calc non Af Amer: >60 mL/min (ref >60)
Glucose, Bld: 230 mg/dL — ABNORMAL HIGH (ref 65–99)
Potassium: 3.5 mmol/L (ref 3.5–5.1)
Sodium: 136 mmol/L (ref 135–145)

## 2016-03-29 MED ORDER — SODIUM CHLORIDE 0.9 % IV BOLUS (SEPSIS)
1000.0000 mL | Freq: Once | INTRAVENOUS | Status: AC
  Administered 2016-03-29: 1000 mL via INTRAVENOUS

## 2016-03-29 MED ORDER — ONDANSETRON HCL 4 MG/2ML IJ SOLN
4.0000 mg | Freq: Once | INTRAMUSCULAR | Status: AC
  Administered 2016-03-29: 4 mg via INTRAVENOUS
  Filled 2016-03-29: qty 2

## 2016-03-29 MED ORDER — LOPERAMIDE HCL 2 MG PO CAPS: 2.0000 mg | ORAL_CAPSULE | Freq: Four times a day (QID) | ORAL | 0 refills | Status: AC | PRN

## 2016-03-29 MED ORDER — ONDANSETRON HCL 4 MG PO TABS: 4.0000 mg | ORAL_TABLET | Freq: Four times a day (QID) | ORAL | 0 refills | Status: DC

## 2016-03-29 NOTE — ED Notes (Signed)
Pt to the br and now wants to walk to pod a 8 TO CHECK ON HER MOTHER WHO IS A  PT

## 2016-03-29 NOTE — ED Notes (Signed)
The pt has had diarrhea since last pm with nausea no temp  She and her mother came in to be seen .  No distress

## 2016-03-29 NOTE — ED Triage Notes (Signed)
C/o diarrhea onset last night c/o nausea this am

## 2016-03-29 NOTE — Discharge Instructions (Signed)
Please read and follow all provided instructions.  Your diagnoses today include:  1. Enteritis   2. Viral syndrome    Tests performed today include: Blood counts and electrolytes Blood tests to check liver and kidney function Blood tests to check pancreas function Urine test to look for infection and pregnancy (in women) Vital signs. See below for your results today.   Medications prescribed:   Take any prescribed medications only as directed.  Home care instructions:  Follow any educational materials contained in this packet.  Follow-up instructions: Please follow-up with your primary care provider in the next 2 days for further evaluation of your symptoms.    Return instructions:  SEEK IMMEDIATE MEDICAL ATTENTION IF: The pain does not go away or becomes severe  A temperature above 101F develops  Repeated vomiting occurs (multiple episodes)  The pain becomes localized to portions of the abdomen. The right side could possibly be appendicitis. In an adult, the left lower portion of the abdomen could be colitis or diverticulitis.  Blood is being passed in stools or vomit (bright red or black tarry stools)  You develop chest pain, difficulty breathing, dizziness or fainting, or become confused, poorly responsive, or inconsolable (young children) If you have any other emergent concerns regarding your health  Additional Information: Abdominal (belly) pain can be caused by many things. Your caregiver performed an examination and possibly ordered blood/urine tests and imaging (CT scan, x-rays, ultrasound). Many cases can be observed and treated at home after initial evaluation in the emergency department. Even though you are being discharged home, abdominal pain can be unpredictable. Therefore, you need a repeated exam if your pain does not resolve, returns, or worsens. Most patients with abdominal pain don't have to be admitted to the hospital or have surgery, but serious problems like  appendicitis and gallbladder attacks can start out as nonspecific pain. Many abdominal conditions cannot be diagnosed in one visit, so follow-up evaluations are very important.  Your vital signs today were: BP 128/86    Pulse 98    Temp 97.8 F (36.6 C) (Oral)    Resp 18    Ht 5\' 4"  (1.626 m)    Wt 87.1 kg    SpO2 97%    BMI 32.96 kg/m  If your blood pressure (bp) was elevated above 135/85 this visit, please have this repeated by your doctor within one month. --------------

## 2016-03-29 NOTE — ED Provider Notes (Signed)
[facility]-EMERGENCY DEPT Provider Note   CSN: [id number] Arrival date & time: 03/29/16  0502     History   Chief Complaint Chief Complaint  Patient presents with  . Diarrhea  . Nausea    HPI Emily [name] is a 41 y.o. female.  HPI  41 y.o. female with a hx of DM, HTN, presents to the Emergency Department today complaining of nausea and diarrhea with onset yesterday. Pt does not endorse any CP/SOB/ABD pain. Notes generalized aches that she rates 5/10. No N/V. 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 States that one of them threw up all over her yesterday. No fevers at home. No URI symptoms. Pt states that she brought her mother here and decided to be checked out herself just in case. No other symptoms noted    Past Medical History:  Diagnosis Date  . ANXIETY   . DEPRESSION   . DIABETES MELLITUS, TYPE II dx 04/2009  . GERD   . Hypertension   . Obesity, unspecified   . PANIC DISORDER     Patient Active Problem List   Diagnosis Date Noted  . Premenopausal menorrhagia 05/04/2011  . Dysfunctional uterine bleeding 03/14/2011  . Lumbar radiculopathy 07/17/2010  . DIABETES MELLITUS, TYPE II 05/20/2009  . GERD 05/19/2009  . DEPRESSION 07/20/2008  . ANXIETY 05/18/2008  . PANIC DISORDER 05/18/2008    Past Surgical History:  Procedure Laterality Date  . NO PAST SURGERIES    . SPINE SURGERY  07/20/2010    OB History    Gravida Para Term Preterm AB Living   2       1 1    SAB TAB Ectopic Multiple Live Births   1               Home Medications    Prior to Admission medications   Medication Sig Start Date End Date Taking? Authorizing Provider  albuterol (PROVENTIL HFA;VENTOLIN HFA) 108 (90 BASE) MCG/ACT inhaler Inhale 2 puffs into the lungs every 4 (four) hours as needed for wheezing.    Historical Provider, MD  benzonatate (TESSALON) 100 MG capsule Take 1 capsule (100 mg total) by mouth every 8 (eight) hours. Patient not taking: Reported on 07/18/2015  06/13/15   Eyvonne Mechanic, PA-C  benzonatate (TESSALON) 100 MG capsule Take 1 capsule (100 mg total) by mouth every 8 (eight) hours. 02/28/16   Dorena Bodo, NP  DimenhyDRINATE (MOTION SICKNESS PO) Take 1 tablet by mouth every 6 (six) hours as needed (vertigo, diziness).    Historical Provider, MD  gabapentin (NEURONTIN) 300 MG capsule Take 300 mg by mouth 3 (three) times daily. 06/30/15   Historical Provider, MD  glipiZIDE (GLUCOTROL XL) 10 MG 24 hr tablet Take 10 mg by mouth daily. 06/23/15   Historical Provider, MD  hydrochlorothiazide (MICROZIDE) 12.5 MG capsule Take 12.5 mg by mouth daily. 06/23/15   Historical Provider, MD  ibuprofen (ADVIL,MOTRIN) 200 MG tablet Take 200-800 mg by mouth every 6 (six) hours as needed for headache or moderate pain.    Historical Provider, MD  ibuprofen (ADVIL,MOTRIN) 800 MG tablet Take 1 tablet (800 mg total) by mouth every 8 (eight) hours as needed for mild pain. Patient not taking: Reported on 07/18/2015 04/10/15   Kristen N Ward, DO  LORazepam (ATIVAN) 0.5 MG tablet Take 1 tablet (0.5 mg total) by mouth 3 (three) times daily as needed for anxiety. Patient not taking: Reported on 07/18/2015 05/14/13   Garlon Hatchet, PA-C  meclizine (ANTIVERT) 25 MG  tablet Take 1 tablet (25 mg total) by mouth 3 (three) times daily as needed for dizziness. 07/19/15   Earley FavorGail Schulz, NP  medroxyPROGESTERone (DEPO-PROVERA) 150 MG/ML injection Inject 150 mg into the muscle every 3 (three) months.      Historical Provider, MD  metFORMIN (GLUCOPHAGE) 1000 MG tablet Take 1,000 mg by mouth 2 (two) times daily. 05/27/15   Historical Provider, MD  metoprolol tartrate (LOPRESSOR) 25 MG tablet Take 25 mg by mouth daily. 06/23/15   Historical Provider, MD  ondansetron (ZOFRAN ODT) 4 MG disintegrating tablet Take 1 tablet (4 mg total) by mouth every 8 (eight) hours as needed for nausea or vomiting. Patient not taking: Reported on 07/18/2015 04/10/15   Layla MawKristen N Ward, DO    Family History Family History   Problem Relation Age of Onset  . Coronary artery disease Father     Had CABG, and angioplasty around age 41  . Diabetes Father   . Sarcoidosis Mother [id number]  . Asthma Mother   . Hyperlipidemia Mother   . Hypertension Mother   . Pulmonary fibrosis Sister 30  . Asthma Sister     Social History Social History  Substance Use Topics  . Smoking status: Never Smoker  . Smokeless tobacco: Never Used     Comment: Has 689 yt old son [name] who lives with her. (Autism & ADD) Work at [phone]  . Alcohol use No     Allergies   Zolpidem tartrate   Review of Systems Review of Systems ROS reviewed and all are negative for acute change except as noted in the HPI.  Physical Exam Updated Vital Signs BP 137/96 (BP Location: Left Arm)   Pulse 104   Temp 97.8 F (36.6 C) (Oral)   Resp 18   Ht 5\' 4"  (1.626 m)   Wt 87.1 kg   SpO2 98%   BMI 32.96 kg/m   Physical Exam  Constitutional: She is oriented to person, place, and time. Vital signs are normal. She appears well-developed and well-nourished.  HENT:  Head: Normocephalic and atraumatic.  Right Ear: Hearing normal.  Left Ear: Hearing normal.  Eyes: Conjunctivae and EOM are normal. Pupils are equal, round, and reactive to light.  Neck: Normal range of motion. Neck supple.  Cardiovascular: Normal rate, regular rhythm, normal heart sounds and intact distal pulses.   Pulmonary/Chest: Effort normal and breath sounds normal.  Abdominal: Soft. Bowel sounds are normal. There is no tenderness. There is no guarding.  Musculoskeletal: Normal range of motion.  Neurological: She is alert and oriented to person, place, and time.  Skin: Skin is warm and dry.  Psychiatric: She has a normal mood and affect. Her speech is normal and behavior is normal. Thought content normal.  Nursing note and vitals reviewed.  ED Treatments / Results  Labs (all labs ordered are listed, but only abnormal results are displayed) Labs Reviewed  URINALYSIS,  ROUTINE W REFLEX MICROSCOPIC - Abnormal; Notable for the following:       Result Value   APPearance HAZY (*)    Glucose, UA >=500 (*)    Ketones, ur 20 (*)    Protein, ur 30 (*)    Leukocytes, UA MODERATE (*)    Bacteria, UA RARE (*)    Squamous Epithelial / LPF 0-5 (*)    All other components within normal limits  BASIC METABOLIC PANEL - Abnormal; Notable for the following:    CO2 20 (*)    Glucose, Bld 230 (*)  All other components within normal limits  PREGNANCY, URINE  CBC WITH DIFFERENTIAL/PLATELET   EKG  EKG Interpretation None      Radiology No results found.  Procedures Procedures (including critical care time)  Medications Ordered in ED Medications  sodium chloride 0.9 % bolus 1,000 mL (not administered)  ondansetron (ZOFRAN) injection 4 mg (not administered)   Initial Impression / Assessment and Plan / ED Course  I have reviewed the triage vital signs and the nursing notes.  Pertinent labs & imaging results that were available during my care of the patient were reviewed by me and considered in my medical decision making (see chart for details).  Final Clinical Impressions(s) / ED Diagnoses  {I have reviewed and evaluated the relevant laboratory values.   {I have reviewed the relevant previous healthcare records.  {I obtained HPI from historian.   ED Course:  Assessment: Pt is a 40yF with hx DM2 who presents with Nausea and diarrhea since yesterday. Notes children with same. No fevers. No CP/SOB/ABD pain. On exam, pt in NAD. Nontoxic/nonseptic appearing. VSS. Afebrile. Lungs CTA. Heart RRR. Abdomen nontender soft. Given fluids and anti emetics in ED. CBC unremarkable. CMP unremarkable. Lipase negative. UA unremarkable. Pregnancy negative. Likely viral etiology. Improved in ED with anti emetics. Tolerating PO. Plan is to DC home with follow up to PCP. At time of discharge, Patient is in no acute distress. Vital Signs are stable. Patient is able to ambulate.  Patient able to tolerate PO.   Disposition/Plan:  DC Home Additional Verbal discharge instructions given and discussed with patient.  Pt Instructed to f/u with PCP in the next week for evaluation and treatment of symptoms. Return precautions given Pt acknowledges and agrees with plan  Supervising Physician Glynn Octave, MD  Final diagnoses:  Enteritis  Viral syndrome    New Prescriptions New Prescriptions   No medications on file     Audry Pili, PA-C 03/29/16 1610    Glynn Octave, MD 03/29/16 [phone]

## 2016-04-02 ENCOUNTER — Encounter: Payer: Self-pay | Admitting: Emergency Medicine

## 2016-04-02 ENCOUNTER — Other Ambulatory Visit (INDEPENDENT_AMBULATORY_CARE_PROVIDER_SITE_OTHER): Payer: BLUE CROSS/BLUE SHIELD

## 2016-04-02 ENCOUNTER — Encounter: Payer: Self-pay | Admitting: Internal Medicine

## 2016-04-02 ENCOUNTER — Ambulatory Visit (INDEPENDENT_AMBULATORY_CARE_PROVIDER_SITE_OTHER): Payer: BLUE CROSS/BLUE SHIELD | Admitting: Internal Medicine

## 2016-04-02 VITALS — BP 116/80 | HR 91 | Temp 98.5°F | Resp 16 | Ht 64.0 in | Wt 191.0 lb

## 2016-04-02 DIAGNOSIS — R42 Dizziness and giddiness: Secondary | ICD-10-CM | POA: Diagnosis not present

## 2016-04-02 DIAGNOSIS — E119 Type 2 diabetes mellitus without complications: Secondary | ICD-10-CM

## 2016-04-02 DIAGNOSIS — B349 Viral infection, unspecified: Secondary | ICD-10-CM | POA: Diagnosis not present

## 2016-04-02 DIAGNOSIS — E1140 Type 2 diabetes mellitus with diabetic neuropathy, unspecified: Secondary | ICD-10-CM | POA: Insufficient documentation

## 2016-04-02 DIAGNOSIS — E1142 Type 2 diabetes mellitus with diabetic polyneuropathy: Secondary | ICD-10-CM | POA: Diagnosis not present

## 2016-04-02 LAB — COMPREHENSIVE METABOLIC PANEL
ALK PHOS: 58 U/L (ref 39–117)
ALT: 18 U/L (ref 0–35)
AST: 13 U/L (ref 0–37)
Albumin: 4.2 g/dL (ref 3.5–5.2)
BUN: 11 mg/dL (ref 6–23)
CO2: 29 mEq/L (ref 19–32)
Calcium: 9.9 mg/dL (ref 8.4–10.5)
Chloride: 97 mEq/L (ref 96–112)
Creatinine, Ser: 0.9 mg/dL (ref 0.40–1.20)
GFR: 89.03 mL/min (ref >60.00)
GLUCOSE: 356 mg/dL — AB (ref 70–99)
POTASSIUM: 4.3 meq/L (ref 3.5–5.1)
SODIUM: 135 meq/L (ref 135–145)
TOTAL PROTEIN: 8.1 g/dL (ref 6.0–8.3)
Total Bilirubin: 0.3 mg/dL (ref 0.2–1.2)

## 2016-04-02 LAB — HEMOGLOBIN A1C: Hgb A1c MFr Bld: 10.6 % — ABNORMAL HIGH (ref 4.6–6.5)

## 2016-04-02 MED ORDER — MECLIZINE HCL 25 MG PO TABS: 25.0000 mg | ORAL_TABLET | Freq: Three times a day (TID) | ORAL | 5 refills | Status: AC | PRN

## 2016-04-02 MED ORDER — BENZONATATE 200 MG PO CAPS: 200.0000 mg | ORAL_CAPSULE | Freq: Three times a day (TID) | ORAL | 1 refills | Status: DC | PRN

## 2016-04-02 MED ORDER — GABAPENTIN 100 MG PO CAPS: ORAL_CAPSULE | ORAL | 3 refills | Status: DC

## 2016-04-02 MED ORDER — MECLIZINE HCL 25 MG PO TABS: 25.0000 mg | ORAL_TABLET | Freq: Three times a day (TID) | ORAL | 5 refills | Status: DC | PRN

## 2016-04-02 NOTE — Assessment & Plan Note (Signed)
Intermittent Improved with meclizine Continue meclizine as needed

## 2016-04-02 NOTE — Assessment & Plan Note (Signed)
Check A1c Stressed continuing regular exercise Improve diet Continue to work on weight loss Continue metformin at current dose-we'll adjust if needed Continue glipizide daily

## 2016-04-02 NOTE — Patient Instructions (Addendum)
  Test(s) ordered today. Your results will be released to MyChart (or called to you) after review, usually within 72hours after test completion. If any changes need to be made, you will be notified at that same time.   Medications reviewed and updated.  Changes include increasing the gabapentin at night by 100 mg nightly as tolerated.  Tessalon perles were sent to your pharmacy for your cough.    Your prescription(s) have been submitted to your pharmacy. Please take as directed and contact our office if you believe you are having problem(s) with the medication(s).   Please followup in 2-3 months

## 2016-04-02 NOTE — Assessment & Plan Note (Addendum)
Taking gabapentin 300 mg twice daily - sometimes feels effective, other times it does not Continue 300 mg in the morning, slowly titrate the dose in evening-add 100 mg and increase to 200 mg for a total of 500 mg if tolerated. We'll continue to increase as tolerated Stressed checking feet daily

## 2016-04-02 NOTE — Assessment & Plan Note (Signed)
Cold symptoms consistent with a viral illness Discussed symptomatic treatment Tessalon Perles as needed for cough Increase rest and fluids Call if no improvement

## 2016-04-02 NOTE — Progress Notes (Signed)
Subjective:    Patient ID: Emily [name], female    DOB: 1975-09-18, 41 y.o.   MRN: [id number]  HPI She is here to establish with a new pcp.    Lightheadedness/dizziness:  She has a history of vertigo.  She takes meclizine as needed and it helps her sleep. She currently does not have any meclizine and needs a refill.  Anxiety:  She is currently not on any medication for anxiety. She feels she is dealing with her anxiety without medication for now.  Diabetes: She is taking her medication daily as prescribed. She is fairly compliant with a diabetic diet. She is exercising regularly at gym. She checks her feet daily and denies foot lesions. She is up-to-date with an ophthalmology examination.   Diabetic neuropathy:  She takes gabapentin twice daily.  Both feet burn.  Sometimes the gabapentin works, sometimes it does not.  She is not able to take it 3 times a day because it does cause drowsiness. She has not tried a higher dose.  Cold symptoms:  She has been sick since last thursday.  She had diarrhea and nause, dehydration.  She went to the ED - she was prescribed by zofran.  She developed a cough.  Yesterday she had sneezing.  She feels like she was run over by a bus.  She is taking mucinex, alka seltzer cold medication.   She denies a history of hypertension. She is currently on hydrochlorothiazide and metoprolol. She wonders if she needs both. She is unsure why she is on metoprolol.  Medications and allergies reviewed with patient and updated if appropriate.  Patient Active Problem List   Diagnosis Date Noted  . Vertigo 04/02/2016  . Diabetic neuropathy (HCC) 04/02/2016  . Premenopausal menorrhagia 05/04/2011  . Dysfunctional uterine bleeding 03/14/2011  . Lumbar radiculopathy 07/17/2010  . Diabetes (HCC) 05/20/2009  . GERD 05/19/2009  . DEPRESSION 07/20/2008  . ANXIETY 05/18/2008  . PANIC DISORDER 05/18/2008    Current Outpatient Prescriptions on File Prior to Visit    Medication Sig Dispense Refill  . albuterol (PROVENTIL HFA;VENTOLIN HFA) 108 (90 BASE) MCG/ACT inhaler Inhale 2 puffs into the lungs every 4 (four) hours as needed for wheezing.    . benzonatate (TESSALON) 100 MG capsule Take 1 capsule (100 mg total) by mouth every 8 (eight) hours. (Patient taking differently: Take 100 mg by mouth every 8 (eight) hours as needed for cough. ) 21 capsule 0  . DimenhyDRINATE (MOTION SICKNESS PO) Take 1 tablet by mouth every 6 (six) hours as needed (vertigo, diziness).    . gabapentin (NEURONTIN) 300 MG capsule Take 300 mg by mouth 2 (two) times daily.   0  . glipiZIDE (GLUCOTROL XL) 10 MG 24 hr tablet Take 10 mg by mouth daily.  5  . ibuprofen (ADVIL,MOTRIN) 200 MG tablet Take 200-800 mg by mouth every 6 (six) hours as needed for headache or moderate pain.    Marland Kitchen loperamide (IMODIUM) 2 MG capsule Take 1 capsule (2 mg total) by mouth 4 (four) times daily as needed for diarrhea or loose stools. 12 capsule 0  . meclizine (ANTIVERT) 25 MG tablet Take 1 tablet (25 mg total) by mouth 3 (three) times daily as needed for dizziness. 30 tablet 0  . medroxyPROGESTERone (DEPO-PROVERA) 150 MG/ML injection Inject 150 mg into the muscle every 3 (three) months.      . metFORMIN (GLUCOPHAGE) 1000 MG tablet Take 1,000 mg by mouth 2 (two) times daily.  3  . metoprolol tartrate (  LOPRESSOR) 25 MG tablet Take 25 mg by mouth daily.  3  . ondansetron (ZOFRAN) 4 MG tablet Take 1 tablet (4 mg total) by mouth every 6 (six) hours. 12 tablet 0   No current facility-administered medications on file prior to visit.     Past Medical History:  Diagnosis Date  . ANXIETY   . DEPRESSION   . DIABETES MELLITUS, TYPE II dx 04/2009  . GERD   . Hypertension   . Obesity, unspecified   . PANIC DISORDER     Past Surgical History:  Procedure Laterality Date  . NO PAST SURGERIES    . SPINE SURGERY  07/20/2010    Social History   Social History  . Marital status: Single    Spouse name: N/A  .  Number of children: N/A  . Years of education: N/A   Social History Main Topics  . Smoking status: Never Smoker  . Smokeless tobacco: Never Used     Comment: Has 61 yt old son Dorma Russell who lives with her. (Autism & ADD) Work at [employer]  . Alcohol use No  . Drug use: No  . Sexual activity: Yes    Birth control/ protection: Condom   Other Topics Concern  . Not on file   Social History Narrative  . No narrative on file    Family History  Problem Relation Age of Onset  . Coronary artery disease Father     Had CABG, and angioplasty around age 60  . Diabetes Father   . Sarcoidosis Mother 62  . Asthma Mother   . Hyperlipidemia Mother   . Hypertension Mother   . Pulmonary fibrosis Sister 30  . Asthma Sister     Review of Systems  Constitutional: Positive for fever (intermittent 99-102, none recently).  HENT: Positive for sneezing. Negative for ear pain and sore throat.   Respiratory: Positive for cough (productive), shortness of breath and wheezing (a little).   Cardiovascular: Negative for chest pain, palpitations and leg swelling.  Gastrointestinal: Positive for nausea. Negative for abdominal pain and diarrhea.       No gerd  Neurological: Positive for dizziness, light-headedness and headaches (with cold).  Psychiatric/Behavioral: Negative for dysphoric mood (on occasion). The patient is nervous/anxious (on occasion).        Objective:   Vitals:   04/02/16 0917  BP: 116/80  Pulse: 91  Resp: 16  Temp: 98.5 F (36.9 C)   Filed Weights   04/02/16 0917  Weight: 191 lb (86.6 kg)   Body mass index is 32.79 kg/m.  Wt Readings from Last 3 Encounters:  04/02/16 191 lb (86.6 kg)  03/29/16 192 lb (87.1 kg)  08/22/15 191 lb 11.2 oz (87 kg)     Physical Exam Constitutional: She appears well-developed and well-nourished. No distress.  HENT:  Head: Normocephalic and atraumatic.  Right Ear: External ear normal. Normal ear canal and TM Left Ear: External ear  normal.  Normal ear canal and TM Mouth/Throat: Oropharynx is clear and moist.  Eyes: Conjunctivae and EOM are normal.  Neck: Neck supple. No tracheal deviation present. No thyromegaly present.  No carotid bruit  Cardiovascular: Normal rate, regular rhythm and normal heart sounds.   No murmur heard.  No edema. Pulmonary/Chest: Effort normal and breath sounds normal. No respiratory distress. She has no wheezes. She has no rales.  Abdominal: Soft. She exhibits no distension. There is no tenderness.  Lymphadenopathy: She has no cervical adenopathy.  Skin: Skin is warm and dry.  She is not diaphoretic.  Psychiatric: She has a normal mood and affect. Her behavior is normal.         Assessment & Plan:   See Problem List for Assessment and Plan of chronic medical problems.

## 2016-04-02 NOTE — Progress Notes (Signed)
Pre visit review using our clinic review tool, if applicable. No additional management support is needed unless otherwise documented below in the visit note. 

## 2016-04-05 ENCOUNTER — Other Ambulatory Visit: Payer: Self-pay | Admitting: Internal Medicine

## 2016-04-05 MED ORDER — EMPAGLIFLOZIN 10 MG PO TABS: 10.0000 mg | ORAL_TABLET | Freq: Every day | ORAL | 5 refills | Status: DC

## 2016-04-09 ENCOUNTER — Ambulatory Visit (INDEPENDENT_AMBULATORY_CARE_PROVIDER_SITE_OTHER): Payer: BLUE CROSS/BLUE SHIELD

## 2016-04-09 VITALS — BP 119/77 | HR 72

## 2016-04-09 DIAGNOSIS — Z3042 Encounter for surveillance of injectable contraceptive: Secondary | ICD-10-CM | POA: Diagnosis not present

## 2016-04-09 MED ORDER — MEDROXYPROGESTERONE ACETATE 150 MG/ML IM SUSP
150.0000 mg | Freq: Once | INTRAMUSCULAR | Status: AC
  Administered 2016-04-09: 150 mg via INTRAMUSCULAR

## 2016-04-09 NOTE — Progress Notes (Signed)
Patient returned to office today for her depo-provera. She will returned to office between April 30-May 14.

## 2016-04-30 ENCOUNTER — Other Ambulatory Visit: Payer: Self-pay

## 2016-04-30 ENCOUNTER — Telehealth: Payer: Self-pay | Admitting: *Deleted

## 2016-04-30 DIAGNOSIS — E559 Vitamin D deficiency, unspecified: Secondary | ICD-10-CM

## 2016-04-30 NOTE — Telephone Encounter (Signed)
The gabapentin can continued to be increased as tolerated - if she takes the higher dose longer it should cause less drowsiness.  We can try adding cymbalta - it is an anti-anxiety/depressant and often helps with nerve pain.    She can take both the cymbalta and gabapentin

## 2016-04-30 NOTE — Telephone Encounter (Signed)
Rec'd call pt states MD increase her Gabapentin to 400 mg at night at last visit. Pt states she is still having neurothophy pain/burning in her feet. This morning she had to take 400 mg because it hurt so bad. Pt is wanting to know is there anything else that she can take for the neurothophy pain in her feet...Raechel Chute/lmb

## 2016-05-01 ENCOUNTER — Telehealth: Payer: Self-pay | Admitting: Internal Medicine

## 2016-05-01 MED ORDER — DULOXETINE HCL 30 MG PO CPEP: 30.0000 mg | ORAL_CAPSULE | Freq: Every day | ORAL | 3 refills | Status: DC

## 2016-05-01 NOTE — Telephone Encounter (Signed)
Notified pt w/MD response. Pt is wanting to add the cymbalta as well. [facility]...Raechel Chute/lmb

## 2016-05-01 NOTE — Telephone Encounter (Signed)
Rec'd from [facility] forward 53 pages to Dr.Burns

## 2016-05-01 NOTE — Telephone Encounter (Signed)
sent 

## 2016-05-15 ENCOUNTER — Ambulatory Visit (INDEPENDENT_AMBULATORY_CARE_PROVIDER_SITE_OTHER): Payer: BLUE CROSS/BLUE SHIELD | Admitting: Internal Medicine

## 2016-05-15 ENCOUNTER — Encounter: Payer: Self-pay | Admitting: Internal Medicine

## 2016-05-15 VITALS — BP 122/82 | HR 93 | Temp 98.3°F | Resp 16 | Wt 192.0 lb

## 2016-05-15 DIAGNOSIS — E119 Type 2 diabetes mellitus without complications: Secondary | ICD-10-CM | POA: Diagnosis not present

## 2016-05-15 DIAGNOSIS — E1142 Type 2 diabetes mellitus with diabetic polyneuropathy: Secondary | ICD-10-CM | POA: Diagnosis not present

## 2016-05-15 DIAGNOSIS — E559 Vitamin D deficiency, unspecified: Secondary | ICD-10-CM | POA: Insufficient documentation

## 2016-05-15 MED ORDER — METOPROLOL TARTRATE 25 MG PO TABS: 25.0000 mg | ORAL_TABLET | Freq: Every day | ORAL | 1 refills | Status: DC

## 2016-05-15 MED ORDER — METFORMIN HCL 1000 MG PO TABS: 1000.0000 mg | ORAL_TABLET | Freq: Two times a day (BID) | ORAL | 3 refills | Status: DC

## 2016-05-15 NOTE — Progress Notes (Signed)
Subjective:    Patient ID: Emily [name], female    DOB: 01-01-1976, 41 y.o.   MRN: [id number]  HPI The patient is here for follow up.  Diabetes: She is taking her medication daily as prescribed. We started jardiance after her last blood work.  She denies side effects.  She is fairly compliant with a diabetic diet. She is not exercising regularly. She does not monitor her sugars.  She checks her feet daily and denies foot lesions. She is up-to-date with an ophthalmology examination.   Diabetic neuropathy: We increased her gabapentin dose at night last month.  She is currently taking 300 mg in the morning and 400 mg at night.   We add cymbalta earlier this month.  She feels her nerve pain is better controlled. The gabapentin and cymbalta make her a little drowsy.   She wonders if she has fibromyalgia.  Her sister has it.  She has diffuse muscle pain.    foot pain:  She has pain on the plantar surface of her left foot.  She is not sure if it is related to the neuropathy or not.  She is also interested in diabetic shoes.   Medications and allergies reviewed with patient and updated if appropriate.  Patient Active Problem List   Diagnosis Date Noted  . Vertigo 04/02/2016  . Diabetic neuropathy (HCC) 04/02/2016  . Premenopausal menorrhagia 05/04/2011  . Dysfunctional uterine bleeding 03/14/2011  . Lumbar radiculopathy 07/17/2010  . Diabetes (HCC) 05/20/2009  . Depression 07/20/2008  . Anxiety 05/18/2008    Current Outpatient Prescriptions on File Prior to Visit  Medication Sig Dispense Refill  . albuterol (PROVENTIL HFA;VENTOLIN HFA) 108 (90 BASE) MCG/ACT inhaler Inhale 2 puffs into the lungs every 4 (four) hours as needed for wheezing.    . DimenhyDRINATE (MOTION SICKNESS PO) Take 1 tablet by mouth every 6 (six) hours as needed (vertigo, diziness).    . DULoxetine (CYMBALTA) 30 MG capsule Take 1 capsule (30 mg total) by mouth daily. 30 capsule 3  . empagliflozin (JARDIANCE) 10  MG TABS tablet Take 10 mg by mouth daily. 30 tablet 5  . gabapentin (NEURONTIN) 100 MG capsule Take 100 mg in evening in addition to 300 mg, increase to 200 mg in evening in addition to 300 mg if tolerated 60 capsule 3  . gabapentin (NEURONTIN) 300 MG capsule Take 300 mg by mouth 2 (two) times daily.   0  . glipiZIDE (GLUCOTROL XL) 10 MG 24 hr tablet Take 10 mg by mouth daily.  5  . loperamide (IMODIUM) 2 MG capsule Take 1 capsule (2 mg total) by mouth 4 (four) times daily as needed for diarrhea or loose stools. 12 capsule 0  . meclizine (ANTIVERT) 25 MG tablet Take 1 tablet (25 mg total) by mouth 3 (three) times daily as needed for dizziness. 30 tablet 5  . medroxyPROGESTERone (DEPO-PROVERA) 150 MG/ML injection Inject 150 mg into the muscle every 3 (three) months.      . metFORMIN (GLUCOPHAGE) 1000 MG tablet Take 1,000 mg by mouth 2 (two) times daily.  3  . metoprolol tartrate (LOPRESSOR) 25 MG tablet Take 25 mg by mouth daily.  3  . ondansetron (ZOFRAN) 4 MG tablet Take 1 tablet (4 mg total) by mouth every 6 (six) hours. 12 tablet 0   No current facility-administered medications on file prior to visit.     Past Medical History:  Diagnosis Date  . ANXIETY   . DEPRESSION   . DIABETES MELLITUS,  TYPE II dx 04/2009  . GERD   . Hypertension   . Obesity, unspecified   . PANIC DISORDER     Past Surgical History:  Procedure Laterality Date  . NO PAST SURGERIES    . SPINE SURGERY  07/20/2010    Social History   Social History  . Marital status: Single    Spouse name: N/A  . Number of children: N/A  . Years of education: N/A   Social History Main Topics  . Smoking status: Never Smoker  . Smokeless tobacco: Never Used     Comment: Has 349 yt old son [name] who lives with her. (Autism & ADD) Work at [phone]  . Alcohol use No  . Drug use: No  . Sexual activity: Yes    Birth control/ protection: Condom   Other Topics Concern  . None   Social History Narrative  . None     Family History  Problem Relation Age of Onset  . Coronary artery disease Father     Had CABG, and angioplasty around age 41  . Diabetes Father   . Sarcoidosis Mother 4855  . Asthma Mother   . Hyperlipidemia Mother   . Hypertension Mother   . Pulmonary fibrosis Sister 30  . Asthma Sister     Review of Systems  Constitutional: Negative for fever.  Respiratory: Negative for cough, shortness of breath and wheezing.   Cardiovascular: Positive for leg swelling (mild, left). Negative for chest pain and palpitations.  Neurological: Negative for light-headedness and headaches.  Psychiatric/Behavioral: Negative for dysphoric mood. The patient is not nervous/anxious.        Objective:   Vitals:   05/15/16 0821  BP: 122/82  Pulse: 93  Resp: 16  Temp: 98.3 F (36.8 C)   Wt Readings from Last 3 Encounters:  05/15/16 192 lb (87.1 kg)  04/02/16 191 lb (86.6 kg)  03/29/16 192 lb (87.1 kg)   Body mass index is 32.96 kg/m.   Physical Exam    Constitutional: Appears well-developed and well-nourished. No distress.  HENT:  Head: Normocephalic and atraumatic.  Neck: Neck supple. No tracheal deviation present. No thyromegaly present.  No cervical lymphadenopathy Cardiovascular: Normal rate, regular rhythm and normal heart sounds.   No murmur heard. No carotid bruit .  No edema Pulmonary/Chest: Effort normal and breath sounds normal. No respiratory distress. No has no wheezes. No rales.  Skin: Skin is warm and dry. Not diaphoretic.  Psychiatric: Normal mood and affect. Behavior is normal.      Assessment & Plan:    See Problem List for Assessment and Plan of chronic medical problems.

## 2016-05-15 NOTE — Progress Notes (Signed)
Pre visit review using our clinic review tool, if applicable. No additional management support is needed unless otherwise documented below in the visit note. 

## 2016-05-15 NOTE — Assessment & Plan Note (Signed)
Stressed restarting regular exercise - used to go to gym Stressed weight loss and improving diet Started jardiance 10 mg daily - tolerating it Continue glipizide, metformin Stressed getting better control to improve nerve pain

## 2016-05-15 NOTE — Assessment & Plan Note (Signed)
Improved with increasing gabapentin at night and adding cymbalta Can titrate above as tolerated Stressed better sugar control

## 2016-05-15 NOTE — Patient Instructions (Addendum)
   Medications reviewed and updated.  No changes recommended at this time.  Your prescription(s) have been submitted to your pharmacy. Please take as directed and contact our office if you believe you are having problem(s) with the medication(s).  A referral was ordered for podiatry  Please followup in 2 months - come fasting

## 2016-06-08 ENCOUNTER — Ambulatory Visit (INDEPENDENT_AMBULATORY_CARE_PROVIDER_SITE_OTHER): Payer: BLUE CROSS/BLUE SHIELD | Admitting: Podiatry

## 2016-06-08 ENCOUNTER — Ambulatory Visit (INDEPENDENT_AMBULATORY_CARE_PROVIDER_SITE_OTHER): Payer: BLUE CROSS/BLUE SHIELD

## 2016-06-08 ENCOUNTER — Encounter: Payer: Self-pay | Admitting: Podiatry

## 2016-06-08 VITALS — BP 142/93 | HR 78

## 2016-06-08 DIAGNOSIS — M2010 Hallux valgus (acquired), unspecified foot: Secondary | ICD-10-CM | POA: Diagnosis not present

## 2016-06-08 DIAGNOSIS — M722 Plantar fascial fibromatosis: Secondary | ICD-10-CM

## 2016-06-08 DIAGNOSIS — E119 Type 2 diabetes mellitus without complications: Secondary | ICD-10-CM

## 2016-06-08 MED ORDER — MELOXICAM 15 MG PO TABS: 15.0000 mg | ORAL_TABLET | Freq: Every day | ORAL | 0 refills | Status: DC

## 2016-06-08 NOTE — Progress Notes (Signed)
   Subjective:    Patient ID: Emily [name], female    DOB: 1975/07/21, 41 y.o.   MRN: [id number]  HPI this patient presents to the office with chief complaint of pain noted in the arch of both feet. Patient states that the pain worsens during the course of the day and by nighttime she was in intense pain.  She points to the area in the middle the arch on both feet, but her right foot is more painful than her left.  This patient is diabetic taking metformin and gabapentin. She presents the office today for an evaluation and treatment of her feet    Review of Systems  Cardiovascular: Positive for leg swelling.       Calf pain with walking  Musculoskeletal: Positive for back pain and gait problem.       Joint pain  Skin:       Change in nails  Neurological: Positive for tremors and numbness.  Hematological: Bruises/bleeds easily.       Objective:   Physical Exam GENERAL APPEARANCE: Alert, conversant. Appropriately groomed. No acute distress.  VASCULAR: Pedal pulses are  palpable at  [facility] and PT bilateral.  Capillary refill time is immediate to all digits,  Normal temperature gradient.   NEUROLOGIC: sensation is normal to 5.07 monofilament at 5/5 sites bilateral.  Light touch is intact bilateral, Muscle strength normal.  MUSCULOSKELETAL: acceptable muscle strength, tone and stability bilateral.  Intrinsic muscluature intact bilateral.  HAV  B/L. Palpable pain along the course of the plantar fascia  B/L  DERMATOLOGIC: skin color, texture, and turgor are within normal limits.  No preulcerative lesions or ulcers  are seen, no interdigital maceration noted.  No open lesions present.  Digital nails are asymptomatic. No drainage noted.         Assessment & Plan:  Plantar Fascitis  B/l  IE  Xrays were taken but not read.  Recommended that she use power step insoles in the better footgear.  Prescribed Mobic for this patient to help control the evening pain.   Returns to clinic in 2-3 weeks  for further evaluation and treatment   Helane Gunther DPM

## 2016-06-13 ENCOUNTER — Ambulatory Visit: Payer: BLUE CROSS/BLUE SHIELD | Admitting: Internal Medicine

## 2016-06-27 ENCOUNTER — Other Ambulatory Visit: Payer: Self-pay | Admitting: Internal Medicine

## 2016-06-29 ENCOUNTER — Encounter: Payer: Self-pay | Admitting: Podiatry

## 2016-06-29 ENCOUNTER — Ambulatory Visit (INDEPENDENT_AMBULATORY_CARE_PROVIDER_SITE_OTHER): Payer: BLUE CROSS/BLUE SHIELD | Admitting: Podiatry

## 2016-06-29 DIAGNOSIS — E119 Type 2 diabetes mellitus without complications: Secondary | ICD-10-CM | POA: Diagnosis not present

## 2016-06-29 DIAGNOSIS — M2010 Hallux valgus (acquired), unspecified foot: Secondary | ICD-10-CM

## 2016-06-29 DIAGNOSIS — M722 Plantar fascial fibromatosis: Secondary | ICD-10-CM

## 2016-06-29 NOTE — Progress Notes (Signed)
   Subjective:    Patient ID: Emily [name], female    DOB: 09/14/1975, 41 y.o.   MRN: [id number]  Foot Pain  Associated symptoms include numbness.   this patient presents to the office follow up for plantar fascitis  B/L  She says she is 75 % improved using the powersteps, insoles and change of shoes.  She also states she has lost weight  And she is planning to return to exercising.      Review of Systems  Cardiovascular: Positive for leg swelling.       Calf pain with walking  Musculoskeletal: Positive for back pain and gait problem.       Joint pain  Skin:       Change in nails  Neurological: Positive for tremors and numbness.  Hematological: Bruises/bleeds easily.       Objective:   Physical Exam GENERAL APPEARANCE: Alert, conversant. Appropriately groomed. No acute distress.  VASCULAR: [facility]al [facility]  [facility].  Capillary refill time is immediate to all digits,  Normal temperature gradient.   NEUROLOGIC: sensation is normal to 5.07 monofilament at 5/5 sites bilateral.  Light touch is intact bilateral, Muscle strength normal.  MUSCULOSKELETAL: acceptable muscle strength, tone and stability bilateral.  Intrinsic muscluature intact bilateral.  HAV  B/L.Marland Kitchen.  Pain has diminished along course of plantar fascia.  DERMATOLOGIC: skin color, texture, and turgor are within normal limits.  No preulcerative lesions or ulcers  are seen, no interdigital maceration noted.  No open lesions present.  Digital nails are asymptomatic. No drainage noted.         Assessment & Plan:  Plantar Fascitis  B/l   ROV  Patient told to continue with insoles and take Mobic.  RTC prn   Helane GuntherGregory Ezell Poke DPM

## 2016-07-19 ENCOUNTER — Encounter: Payer: Self-pay | Admitting: Internal Medicine

## 2016-07-19 ENCOUNTER — Ambulatory Visit (INDEPENDENT_AMBULATORY_CARE_PROVIDER_SITE_OTHER): Payer: BLUE CROSS/BLUE SHIELD | Admitting: Internal Medicine

## 2016-07-19 ENCOUNTER — Other Ambulatory Visit (INDEPENDENT_AMBULATORY_CARE_PROVIDER_SITE_OTHER): Payer: BLUE CROSS/BLUE SHIELD

## 2016-07-19 VITALS — BP 118/80 | HR 86 | Temp 98.4°F | Resp 16 | Ht 64.0 in | Wt 189.0 lb

## 2016-07-19 DIAGNOSIS — E119 Type 2 diabetes mellitus without complications: Secondary | ICD-10-CM | POA: Diagnosis not present

## 2016-07-19 DIAGNOSIS — I10 Essential (primary) hypertension: Secondary | ICD-10-CM

## 2016-07-19 DIAGNOSIS — Z114 Encounter for screening for human immunodeficiency virus [HIV]: Secondary | ICD-10-CM | POA: Diagnosis not present

## 2016-07-19 DIAGNOSIS — E1142 Type 2 diabetes mellitus with diabetic polyneuropathy: Secondary | ICD-10-CM | POA: Diagnosis not present

## 2016-07-19 DIAGNOSIS — H9201 Otalgia, right ear: Secondary | ICD-10-CM

## 2016-07-19 DIAGNOSIS — E559 Vitamin D deficiency, unspecified: Secondary | ICD-10-CM | POA: Diagnosis not present

## 2016-07-19 LAB — COMPREHENSIVE METABOLIC PANEL
ALT: 9 U/L (ref 0–35)
AST: 10 U/L (ref 0–37)
Albumin: 4.3 g/dL (ref 3.5–5.2)
Alkaline Phosphatase: 55 U/L (ref 39–117)
BUN: 9 mg/dL (ref 6–23)
CHLORIDE: 102 meq/L (ref 96–112)
CO2: 25 meq/L (ref 19–32)
Calcium: 9.6 mg/dL (ref 8.4–10.5)
Creatinine, Ser: 0.73 mg/dL (ref 0.40–1.20)
GFR: 113.2 mL/min (ref >60.00)
GLUCOSE: 156 mg/dL — AB (ref 70–99)
POTASSIUM: 4.3 meq/L (ref 3.5–5.1)
Sodium: 137 mEq/L (ref 135–145)
Total Bilirubin: 0.5 mg/dL (ref 0.2–1.2)
Total Protein: 7.6 g/dL (ref 6.0–8.3)

## 2016-07-19 LAB — HIV ANTIBODY (ROUTINE TESTING W REFLEX): HIV 1&2 Ab, 4th Generation: NONREACTIVE

## 2016-07-19 LAB — MICROALBUMIN / CREATININE URINE RATIO
CREATININE, U: 96.8 mg/dL
MICROALB/CREAT RATIO: 3.1 mg/g (ref 0.0–30.0)
Microalb, Ur: 3 mg/dL — ABNORMAL HIGH (ref 0.0–1.9)

## 2016-07-19 LAB — LDL CHOLESTEROL, DIRECT: Direct LDL: 83 mg/dL

## 2016-07-19 LAB — LIPID PANEL
Cholesterol: 198 mg/dL (ref 0–200)
HDL: 33 mg/dL — AB (ref >39.00)
NonHDL: 165.45
TRIGLYCERIDES: 398 mg/dL — AB (ref 0.0–149.0)
Total CHOL/HDL Ratio: 6
VLDL: 79.6 mg/dL — ABNORMAL HIGH (ref 0.0–40.0)

## 2016-07-19 LAB — HEMOGLOBIN A1C: Hgb A1c MFr Bld: 10.9 % — ABNORMAL HIGH (ref 4.6–6.5)

## 2016-07-19 MED ORDER — VITAMIN D 50 MCG (2000 UT) PO CAPS: 1.0000 | ORAL_CAPSULE | Freq: Every day | ORAL | 3 refills | Status: DC

## 2016-07-19 NOTE — Assessment & Plan Note (Signed)
Check a1c Low sugar / carb diet Stressed regular exercise, weight loss  

## 2016-07-19 NOTE — Assessment & Plan Note (Signed)
Feet feel better - improved continue podiatry visits Continue gabapentin and cymbalta

## 2016-07-19 NOTE — Assessment & Plan Note (Signed)
BP well controlled Current regimen effective and well tolerated Continue current medications at current doses cmp  

## 2016-07-19 NOTE — Progress Notes (Signed)
Subjective:    Patient ID: Emily [name], female    DOB: Jan 30, 1976, 41 y.o.   MRN: [id number]  HPI The patient is here for follow up.  Diabetes: She is taking her medication daily as prescribed. She is compliant with a diabetic diet. She is exercising irregularly, but is active during the day. She monitors her sugars and they have been better controlled 150,107,171,96,234,221. She checks her feet daily and denies foot lesions. She is up-to-date with an ophthalmology examination.   Diabetic neuropathy:  She is taking gabapentin 300 mg TID and cymbalta.  She does see podiatry.  Her feet feel better.    Hypertension: She is taking her medication daily. She is compliant with a low sodium diet.  She denies chest pain, palpitations, edema, shortness of breath and regular headaches. She is exercising irregularly.      Ear pain, right:  It started yesterday.  Her right hear is tender and she has a headache on that side around her ear.  She works at a daycare.  She denies change in her hearing.  She put some peroxide in her ear this morning.  She does have a mild cough and sneeze, which she feels is related to allergies.   Medications and allergies reviewed with patient and updated if appropriate.  Patient Active Problem List   Diagnosis Date Noted  . Hypertension 07/19/2016  . Vitamin D deficiency 05/15/2016  . Vertigo 04/02/2016  . Diabetic neuropathy (HCC) 04/02/2016  . Premenopausal menorrhagia 05/04/2011  . Dysfunctional uterine bleeding 03/14/2011  . Lumbar radiculopathy 07/17/2010  . Diabetes (HCC) 05/20/2009    Current Outpatient Prescriptions on File Prior to Visit  Medication Sig Dispense Refill  . albuterol (PROVENTIL HFA;VENTOLIN HFA) 108 (90 BASE) MCG/ACT inhaler Inhale 2 puffs into the lungs every 4 (four) hours as needed for wheezing.    . DimenhyDRINATE (MOTION SICKNESS PO) Take 1 tablet by mouth every 6 (six) hours as needed (vertigo, diziness).    . DULoxetine  (CYMBALTA) 30 MG capsule Take 1 capsule (30 mg total) by mouth daily. 30 capsule 3  . empagliflozin (JARDIANCE) 10 MG TABS tablet Take 10 mg by mouth daily. 30 tablet 5  . gabapentin (NEURONTIN) 100 MG capsule Take 100 mg in evening in addition to 300 mg, increase to 200 mg in evening in addition to 300 mg if tolerated 60 capsule 3  . gabapentin (NEURONTIN) 300 MG capsule TAKE 1 CAPSULE BY MOUTH 3 TIMES DAILY 90 capsule 1  . glipiZIDE (GLUCOTROL XL) 10 MG 24 hr tablet Take 10 mg by mouth daily.  5  . loperamide (IMODIUM) 2 MG capsule Take 1 capsule (2 mg total) by mouth 4 (four) times daily as needed for diarrhea or loose stools. 12 capsule 0  . meclizine (ANTIVERT) 25 MG tablet Take 1 tablet (25 mg total) by mouth 3 (three) times daily as needed for dizziness. 30 tablet 5  . medroxyPROGESTERone (DEPO-PROVERA) 150 MG/ML injection Inject 150 mg into the muscle every 3 (three) months.      . meloxicam (MOBIC) 15 MG tablet Take 1 tablet (15 mg total) by mouth daily. 30 tablet 0  . metFORMIN (GLUCOPHAGE) 1000 MG tablet Take 1 tablet (1,000 mg total) by mouth 2 (two) times daily. 180 tablet 3  . metoprolol tartrate (LOPRESSOR) 25 MG tablet Take 1 tablet (25 mg total) by mouth daily. 90 tablet 1  . ondansetron (ZOFRAN) 4 MG tablet Take 1 tablet (4 mg total) by mouth every 6 (six)  hours. 12 tablet 0   No current facility-administered medications on file prior to visit.     Past Medical History:  Diagnosis Date  . ANXIETY   . Anxiety 05/18/2008   Qualifier: Diagnosis of  By: Shawnie PonsPratt MD, Kenney Housemananya    . DEPRESSION   . Depression 07/20/2008   Qualifier: Diagnosis of  By: Shawnie PonsPratt MD, Kenney Housemananya    . DIABETES MELLITUS, TYPE II dx 04/2009  . GERD   . Hypertension   . Obesity, unspecified   . PANIC DISORDER     Past Surgical History:  Procedure Laterality Date  . NO PAST SURGERIES    . SPINE SURGERY  07/20/2010    Social History   Social History  . Marital status: Single    Spouse name: N/A  . Number of  children: N/A  . Years of education: N/A   Social History Main Topics  . Smoking status: Never Smoker  . Smokeless tobacco: Never Used     Comment: Has 1039 yt old son [name] who lives with her. (Autism & ADD) Work at [employer]  . Alcohol use No  . Drug use: No  . Sexual activity: Yes    Birth control/ protection: Condom   Other Topics Concern  . Not on file   Social History Narrative  . No narrative on file    Family History  Problem Relation Age of Onset  . Coronary artery disease Father        Had CABG, and angioplasty around age 41  . Diabetes Father   . Sarcoidosis Mother 1855  . Asthma Mother   . Hyperlipidemia Mother   . Hypertension Mother   . Pulmonary fibrosis Sister 30  . Asthma Sister     Review of Systems  Constitutional: Negative for chills and fever.  HENT: Positive for ear pain and postnasal drip.   Respiratory: Positive for cough (allergies). Negative for shortness of breath and wheezing.   Cardiovascular: Positive for chest pain (chest pressure 2 different days). Negative for palpitations and leg swelling.  Neurological: Positive for light-headedness and headaches (right sided). Negative for dizziness.       Objective:   Vitals:   07/19/16 0806  BP: 118/80  Pulse: 86  Resp: 16  Temp: 98.4 F (36.9 C)   Wt Readings from Last 3 Encounters:  07/19/16 189 lb (85.7 kg)  05/15/16 192 lb (87.1 kg)  04/02/16 191 lb (86.6 kg)   Body mass index is 32.44 kg/m.   Physical Exam    GENERAL APPEARANCE: Appears stated age, well appearing, NAD EYES: conjunctiva clear, no icterus HEENT: bilateral tympanic membranes and ear canals normal, oropharynx with no erythema, no thyromegaly, trachea midline, no cervical or supraclavicular lymphadenopathy LUNGS: Clear to auscultation without wheeze or crackles, unlabored breathing, good air entry bilaterally HEART: Normal S1,S2 without murmurs EXTREMITIES: Without clubbing, cyanosis, or  edema      Assessment & Plan:    See Problem List for Assessment and Plan of chronic medical problems.   FU in 3 months

## 2016-07-19 NOTE — Assessment & Plan Note (Signed)
Continue vitamin D 2000 units daily.

## 2016-07-19 NOTE — Assessment & Plan Note (Signed)
No evidence of infection Likely related to allergies

## 2016-07-19 NOTE — Patient Instructions (Addendum)
  Test(s) ordered today. Your results will be released to MyChart (or called to you) after review, usually within 72hours after test completion. If any changes need to be made, you will be notified at that same time.  All other Health Maintenance issues reviewed.   All recommended immunizations and age-appropriate screenings are up-to-date or discussed.  No immunization administered today.   Medications reviewed and updated.  No changes recommended at this time.  Your prescription(s) have been submitted to your pharmacy. Please take as directed and contact our office if you believe you are having problem(s) with the medication(s).    Please followup in 3 months

## 2016-07-24 ENCOUNTER — Other Ambulatory Visit: Payer: Self-pay | Admitting: Emergency Medicine

## 2016-07-24 ENCOUNTER — Telehealth: Payer: Self-pay | Admitting: Internal Medicine

## 2016-07-24 MED ORDER — EMPAGLIFLOZIN 25 MG PO TABS: 25.0000 mg | ORAL_TABLET | Freq: Every day | ORAL | 2 refills | Status: DC

## 2016-07-24 NOTE — Telephone Encounter (Signed)
Pt would like a call back with lab results. Please call work number.

## 2016-07-25 NOTE — Telephone Encounter (Signed)
Spoke with pt on 07/24/16. See lab results note.

## 2016-08-17 ENCOUNTER — Other Ambulatory Visit: Payer: Self-pay | Admitting: Obstetrics and Gynecology

## 2016-08-17 DIAGNOSIS — E559 Vitamin D deficiency, unspecified: Secondary | ICD-10-CM

## 2016-08-22 ENCOUNTER — Ambulatory Visit
Admission: RE | Admit: 2016-08-22 | Discharge: 2016-08-22 | Disposition: A | Discharge status: Home / Self Care | Payer: BLUE CROSS/BLUE SHIELD | Source: Ambulatory Visit | Attending: Obstetrics and Gynecology | Admitting: Obstetrics and Gynecology

## 2016-08-22 DIAGNOSIS — E559 Vitamin D deficiency, unspecified: Secondary | ICD-10-CM

## 2016-09-05 ENCOUNTER — Other Ambulatory Visit: Payer: Self-pay | Admitting: Emergency Medicine

## 2016-09-05 MED ORDER — GLIPIZIDE ER 10 MG PO TB24: 10.0000 mg | ORAL_TABLET | Freq: Every day | ORAL | 1 refills | Status: DC

## 2016-09-17 ENCOUNTER — Other Ambulatory Visit: Payer: Self-pay | Admitting: Internal Medicine

## 2016-10-01 ENCOUNTER — Other Ambulatory Visit: Payer: Self-pay | Admitting: Internal Medicine

## 2016-10-19 ENCOUNTER — Ambulatory Visit: Payer: BLUE CROSS/BLUE SHIELD | Admitting: Internal Medicine

## 2016-10-21 NOTE — Patient Instructions (Addendum)
  Test(s) ordered today. Your results will be released to MyChart (or called to you) after review, usually within 72hours after test completion. If any changes need to be made, you will be notified at that same time.  All other Health Maintenance issues reviewed.   All recommended immunizations and age-appropriate screenings are up-to-date or discussed.  No immunizations administered today.   Medications reviewed and updated.  No changes recommended at this time.   Please followup in 3 months   

## 2016-10-21 NOTE — Progress Notes (Signed)
Subjective:    Patient ID: Emily [name], female    DOB: 01/18/1976, 41 y.o.   MRN: [id number]  HPI The patient is here for follow up.  Hypertension: She is taking her medication daily. She is compliant with a low sodium diet.  She denies chest pain, palpitations, edema, shortness of breath and regular headaches. She is exercising a couple of times a week.  She does not monitor her blood pressure at home.    Diabetes: She is taking her medication daily as prescribed. She is fairly compliant with a diabetic diet. She is exercising a couple of times a week. She has not checked her sugars recently.  She checks her feet daily and denies foot lesions. She is up-to-date with an ophthalmology examination.   Diabetic neuropathy:  She is taking Cymbalta and Neurontin.  She is tolerating the medications well.  She feels her symptoms are well controlled.     Medications and allergies reviewed with patient and updated if appropriate.  Patient Active Problem List   Diagnosis Date Noted  . Hypertension 07/19/2016  . Right ear pain 07/19/2016  . Vitamin D deficiency 05/15/2016  . Vertigo 04/02/2016  . Diabetic neuropathy (HCC) 04/02/2016  . Premenopausal menorrhagia 05/04/2011  . Dysfunctional uterine bleeding 03/14/2011  . Lumbar radiculopathy 07/17/2010  . Diabetes (HCC) 05/20/2009    Current Outpatient Prescriptions on File Prior to Visit  Medication Sig Dispense Refill  . albuterol (PROVENTIL HFA;VENTOLIN HFA) 108 (90 BASE) MCG/ACT inhaler Inhale 2 puffs into the lungs every 4 (four) hours as needed for wheezing.    . Cholecalciferol (VITAMIN D) 2000 units CAPS Take 1 capsule (2,000 Units total) by mouth daily. 90 capsule 3  . DimenhyDRINATE (MOTION SICKNESS PO) Take 1 tablet by mouth every 6 (six) hours as needed (vertigo, diziness).    . DULoxetine (CYMBALTA) 30 MG capsule TAKE 1 CAPSULE BY MOUTH EVERY DAY 30 capsule 0  . empagliflozin (JARDIANCE) 25 MG TABS tablet Take 25 mg by  mouth daily. 30 tablet 2  . gabapentin (NEURONTIN) 100 MG capsule Take 100 mg in evening in addition to 300 mg, increase to 200 mg in evening in addition to 300 mg if tolerated 60 capsule 3  . gabapentin (NEURONTIN) 300 MG capsule TAKE 1 CAPSULE BY MOUTH 3 TIMES DAILY 90 capsule 0  . glipiZIDE (GLUCOTROL XL) 10 MG 24 hr tablet TAKE 1 TABLET BY MOUTH EVERY DAY 30 tablet 1  . loperamide (IMODIUM) 2 MG capsule Take 1 capsule (2 mg total) by mouth 4 (four) times daily as needed for diarrhea or loose stools. 12 capsule 0  . meclizine (ANTIVERT) 25 MG tablet Take 1 tablet (25 mg total) by mouth 3 (three) times daily as needed for dizziness. 30 tablet 5  . medroxyPROGESTERone (DEPO-PROVERA) 150 MG/ML injection Inject 150 mg into the muscle every 3 (three) months.      . meloxicam (MOBIC) 15 MG tablet Take 1 tablet (15 mg total) by mouth daily. 30 tablet 0  . metFORMIN (GLUCOPHAGE) 1000 MG tablet Take 1 tablet (1,000 mg total) by mouth 2 (two) times daily. 180 tablet 3  . metoprolol tartrate (LOPRESSOR) 25 MG tablet TAKE 1 TABLET BY MOUTH EVERY DAY 90 tablet 0  . ondansetron (ZOFRAN) 4 MG tablet Take 1 tablet (4 mg total) by mouth every 6 (six) hours. 12 tablet 0   No current facility-administered medications on file prior to visit.     Past Medical History:  Diagnosis Date  . ANXIETY   .  Anxiety 05/18/2008   Qualifier: Diagnosis of  By: Shawnie Pons MD, Kenney Houseman    . DEPRESSION   . Depression 07/20/2008   Qualifier: Diagnosis of  By: Shawnie Pons MD, Kenney Houseman    . DIABETES MELLITUS, TYPE II dx 04/2009  . GERD   . Hypertension   . Obesity, unspecified   . PANIC DISORDER     Past Surgical History:  Procedure Laterality Date  . NO PAST SURGERIES    . SPINE SURGERY  07/20/2010    Social History   Social History  . Marital status: Single    Spouse name: N/A  . Number of children: N/A  . Years of education: N/A   Social History Main Topics  . Smoking status: Never Smoker  . Smokeless tobacco: Never Used      Comment: Has 40 yt old son Dorma Russell who lives with her. (Autism & ADD) Work at [employer]  . Alcohol use No  . Drug use: No  . Sexual activity: Yes    Birth control/ protection: Condom   Other Topics Concern  . Not on file   Social History Narrative  . No narrative on file    Family History  Problem Relation Age of Onset  . Coronary artery disease Father        Had CABG, and angioplasty around age 45  . Diabetes Father   . Sarcoidosis Mother 74  . Asthma Mother   . Hyperlipidemia Mother   . Hypertension Mother   . Pulmonary fibrosis Sister 30  . Asthma Sister     Review of Systems  Constitutional: Negative for chills and fever.  Respiratory: Negative for cough, shortness of breath and wheezing.   Cardiovascular: Negative for chest pain, palpitations and leg swelling.  Neurological: Negative for light-headedness and headaches.       Objective:   Vitals:   10/22/16 0950  BP: 124/78  Pulse: 78  Resp: 16  Temp: 99.3 F (37.4 C)  SpO2: 97%   Wt Readings from Last 3 Encounters:  10/22/16 188 lb (85.3 kg)  07/19/16 189 lb (85.7 kg)  05/15/16 192 lb (87.1 kg)   Body mass index is 32.27 kg/m.   Physical Exam    Constitutional: Appears well-developed and well-nourished. No distress.  HENT:  Head: Normocephalic and atraumatic.  Neck: Neck supple. No tracheal deviation present. No thyromegaly present.  No cervical lymphadenopathy Cardiovascular: Normal rate, regular rhythm and normal heart sounds.   No murmur heard. No carotid bruit .  No edema Pulmonary/Chest: Effort normal and breath sounds normal. No respiratory distress. No has no wheezes. No rales.  Skin: Skin is warm and dry. Not diaphoretic.  Psychiatric: Normal mood and affect. Behavior is normal.      Assessment & Plan:    See Problem List for Assessment and Plan of chronic medical problems.   FU in 3 months

## 2016-10-22 ENCOUNTER — Other Ambulatory Visit (INDEPENDENT_AMBULATORY_CARE_PROVIDER_SITE_OTHER): Payer: BLUE CROSS/BLUE SHIELD

## 2016-10-22 ENCOUNTER — Ambulatory Visit (INDEPENDENT_AMBULATORY_CARE_PROVIDER_SITE_OTHER): Payer: BLUE CROSS/BLUE SHIELD | Admitting: Internal Medicine

## 2016-10-22 ENCOUNTER — Encounter: Payer: Self-pay | Admitting: Internal Medicine

## 2016-10-22 VITALS — BP 124/78 | HR 78 | Temp 99.3°F | Resp 16 | Wt 188.0 lb

## 2016-10-22 DIAGNOSIS — I10 Essential (primary) hypertension: Secondary | ICD-10-CM

## 2016-10-22 DIAGNOSIS — E119 Type 2 diabetes mellitus without complications: Secondary | ICD-10-CM | POA: Diagnosis not present

## 2016-10-22 DIAGNOSIS — E1142 Type 2 diabetes mellitus with diabetic polyneuropathy: Secondary | ICD-10-CM | POA: Diagnosis not present

## 2016-10-22 LAB — COMPREHENSIVE METABOLIC PANEL
ALT: 9 U/L (ref 0–35)
AST: 9 U/L (ref 0–37)
Albumin: 4.2 g/dL (ref 3.5–5.2)
Alkaline Phosphatase: 52 U/L (ref 39–117)
BUN: 9 mg/dL (ref 6–23)
CALCIUM: 9.7 mg/dL (ref 8.4–10.5)
CO2: 28 mEq/L (ref 19–32)
Chloride: 104 mEq/L (ref 96–112)
Creatinine, Ser: 0.75 mg/dL (ref 0.40–1.20)
GFR: 109.58 mL/min (ref >60.00)
GLUCOSE: 155 mg/dL — AB (ref 70–99)
POTASSIUM: 4.2 meq/L (ref 3.5–5.1)
Sodium: 138 mEq/L (ref 135–145)
TOTAL PROTEIN: 7.6 g/dL (ref 6.0–8.3)
Total Bilirubin: 0.5 mg/dL (ref 0.2–1.2)

## 2016-10-22 LAB — LDL CHOLESTEROL, DIRECT: Direct LDL: 103 mg/dL

## 2016-10-22 LAB — LIPID PANEL
CHOLESTEROL: 193 mg/dL (ref 0–200)
HDL: 31 mg/dL — AB (ref >39.00)
NONHDL: 161.72
TRIGLYCERIDES: 256 mg/dL — AB (ref 0.0–149.0)
Total CHOL/HDL Ratio: 6
VLDL: 51.2 mg/dL — ABNORMAL HIGH (ref 0.0–40.0)

## 2016-10-22 LAB — HEMOGLOBIN A1C: HEMOGLOBIN A1C: 9.2 % — AB (ref 4.6–6.5)

## 2016-10-22 NOTE — Assessment & Plan Note (Signed)
BP well controlled Current regimen effective and well tolerated Continue current medications at current doses cmp  

## 2016-10-22 NOTE — Assessment & Plan Note (Signed)
Controlled, stable Continue current dose of medication - cymbalta and neurontin

## 2016-10-22 NOTE — Assessment & Plan Note (Addendum)
Check a1c, cmp, lipid Low sugar / carb diet Stressed regular exercise, weight loss Consider starting truclity or similar if covered, goal is to d/c glipizide

## 2016-10-25 ENCOUNTER — Other Ambulatory Visit: Payer: Self-pay | Admitting: Internal Medicine

## 2016-10-25 MED ORDER — ATORVASTATIN CALCIUM 20 MG PO TABS: 20.0000 mg | ORAL_TABLET | Freq: Every day | ORAL | 3 refills | Status: DC

## 2016-10-25 NOTE — Addendum Note (Signed)
Addended by: Zenovia JordanMITCHELL, TAYLOR B on: 10/25/2016 02:09 PM   Modules accepted: Orders

## 2016-10-25 NOTE — Telephone Encounter (Signed)
Kidney and liver tests normal.  Cholesterol should be lower - I do recommend she goes on a cholesterol lowering medication (atorvastatin 20 mg daily).  Her a1c is 9.2% - better but still high.  Does she want to try a once a week injection like trulicity?

## 2016-10-25 NOTE — Telephone Encounter (Signed)
What about trulicity?

## 2016-10-25 NOTE — Addendum Note (Signed)
Addended by: Pincus SanesBURNS, Sharlon Pfohl J on: 10/25/2016 02:20 PM   Modules accepted: Orders

## 2016-10-25 NOTE — Telephone Encounter (Signed)
Spoke with pt to inform. Pt is okay with starting atorvastatin and Trulicity. Please send to [facility].

## 2016-10-25 NOTE — Telephone Encounter (Signed)
Pt called checking on her labs results from Monday. She said that she was specifically wondering about her A1C level.

## 2016-10-26 MED ORDER — DULAGLUTIDE 0.75 MG/0.5ML ~~LOC~~ SOAJ: 0.7500 mg | SUBCUTANEOUS | 5 refills | Status: DC

## 2016-10-26 NOTE — Addendum Note (Signed)
Addended by: Pincus SanesBURNS, STACY J on: 10/26/2016 08:34 AM   Modules accepted: Orders

## 2016-10-26 NOTE — Telephone Encounter (Addendum)
She is okay with starting both. Advised her Trulicity may need a PA

## 2016-11-15 ENCOUNTER — Other Ambulatory Visit: Payer: Self-pay | Admitting: Internal Medicine

## 2016-12-10 ENCOUNTER — Other Ambulatory Visit: Payer: Self-pay | Admitting: Internal Medicine

## 2016-12-16 ENCOUNTER — Other Ambulatory Visit: Payer: Self-pay | Admitting: Internal Medicine

## 2017-01-21 ENCOUNTER — Encounter: Payer: Self-pay | Admitting: Internal Medicine

## 2017-01-21 DIAGNOSIS — E785 Hyperlipidemia, unspecified: Secondary | ICD-10-CM | POA: Insufficient documentation

## 2017-01-21 HISTORY — DX: Hyperlipidemia, unspecified: E78.5

## 2017-01-21 NOTE — Progress Notes (Signed)
Subjective:    Patient ID: Emily [name], female    DOB: 10/25/1975, 41 y.o.   MRN: [id number]  HPI The patient is here for follow up.  Hypertension: She is taking her medication daily. She is compliant with a low sodium diet.  She denies chest pain, palpitations, edema, shortness of breath and regular headaches. She is very active, but not exercising regularly.  She does not monitor her blood pressure at home.    Diabetes: She is taking her medication daily as prescribed. She is compliant with a diabetic diet. She is active, but not exercising regularly. She monitors her sugars and they have been running 120's mostly, occ over 150. She checks her feet daily and denies foot lesions. She is not up-to-date with an ophthalmology examination.   Diabetic Neuropathy:  She is taking gabapentin and cymbalta.  Her neuropathy is better.  She checks her feet daily.    Hyperlipidemia: She is taking her medication daily. She is compliant with a low fat/cholesterol diet. She is active, but not exercising regularly. She denies myalgias.    Medications and allergies reviewed with patient and updated if appropriate.  Patient Active Problem List   Diagnosis Date Noted  . Hyperlipidemia 01/21/2017  . Hypertension 07/19/2016  . Vitamin D deficiency 05/15/2016  . Vertigo 04/02/2016  . Diabetic neuropathy (HCC) 04/02/2016  . Premenopausal menorrhagia 05/04/2011  . Dysfunctional uterine bleeding 03/14/2011  . Lumbar radiculopathy 07/17/2010  . Diabetes (HCC) 05/20/2009    Current Outpatient Medications on File Prior to Visit  Medication Sig Dispense Refill  . albuterol (PROVENTIL HFA;VENTOLIN HFA) 108 (90 BASE) MCG/ACT inhaler Inhale 2 puffs into the lungs every 4 (four) hours as needed for wheezing.    Marland Kitchen. atorvastatin (LIPITOR) 20 MG tablet Take 1 tablet (20 mg total) by mouth daily. 90 tablet 3  . Cholecalciferol (VITAMIN D) 2000 units CAPS Take 1 capsule (2,000 Units total) by mouth daily. 90  capsule 3  . DimenhyDRINATE (MOTION SICKNESS PO) Take 1 tablet by mouth every 6 (six) hours as needed (vertigo, diziness).    . Dulaglutide (TRULICITY) 0.75 MG/0.5ML SOPN Inject 0.75 mg into the skin once a week. 4 pen 5  . DULoxetine (CYMBALTA) 30 MG capsule TAKE 1 CAPSULE BY MOUTH EVERY DAY 30 capsule 5  . gabapentin (NEURONTIN) 100 MG capsule Take 100 mg in evening in addition to 300 mg, increase to 200 mg in evening in addition to 300 mg if tolerated 60 capsule 3  . gabapentin (NEURONTIN) 300 MG capsule TAKE 1 CAPSULE BY MOUTH 3 TIMES DAILY 90 capsule 0  . glipiZIDE (GLUCOTROL XL) 10 MG 24 hr tablet TAKE 1 TABLET BY MOUTH EVERY DAY 30 tablet 0  . JARDIANCE 25 MG TABS tablet TAKE 1 TABLET BY MOUTH EVERY DAY 30 tablet 1  . loperamide (IMODIUM) 2 MG capsule Take 1 capsule (2 mg total) by mouth 4 (four) times daily as needed for diarrhea or loose stools. 12 capsule 0  . meclizine (ANTIVERT) 25 MG tablet Take 1 tablet (25 mg total) by mouth 3 (three) times daily as needed for dizziness. 30 tablet 5  . medroxyPROGESTERone (DEPO-PROVERA) 150 MG/ML injection Inject 150 mg into the muscle every 3 (three) months.      . meloxicam (MOBIC) 15 MG tablet Take 1 tablet (15 mg total) by mouth daily. 30 tablet 0  . metFORMIN (GLUCOPHAGE) 1000 MG tablet Take 1 tablet (1,000 mg total) by mouth 2 (two) times daily. (Patient taking differently: Take 1,000  mg by mouth daily with breakfast. ) 180 tablet 3  . metoprolol tartrate (LOPRESSOR) 25 MG tablet TAKE 1 TABLET BY MOUTH EVERY DAY 90 tablet 0  . ondansetron (ZOFRAN) 4 MG tablet Take 1 tablet (4 mg total) by mouth every 6 (six) hours. 12 tablet 0   No current facility-administered medications on file prior to visit.     Past Medical History:  Diagnosis Date  . ANXIETY   . Anxiety 05/18/2008   Qualifier: Diagnosis of  By: Shawnie PonsPratt MD, Kenney Housemananya    . DEPRESSION   . Depression 07/20/2008   Qualifier: Diagnosis of  By: Shawnie PonsPratt MD, Kenney Housemananya    . DIABETES MELLITUS, TYPE II  dx 04/2009  . GERD   . Hyperlipidemia 01/21/2017  . Hypertension   . Obesity, unspecified   . PANIC DISORDER     Past Surgical History:  Procedure Laterality Date  . NO PAST SURGERIES    . SPINE SURGERY  07/20/2010    Social History   Socioeconomic History  . Marital status: Single    Spouse name: None  . Number of children: None  . Years of education: None  . Highest education level: None  Social Needs  . Financial resource strain: None  . Food insecurity - worry: None  . Food insecurity - inability: None  . Transportation needs - medical: None  . Transportation needs - non-medical: None  Occupational History  . None  Tobacco Use  . Smoking status: Never Smoker  . Smokeless tobacco: Never Used  . Tobacco comment: Has 279 yt old son [name] who lives with her. (Autism & ADD) Work at [phone]  Substance and Sexual Activity  . Alcohol use: No  . Drug use: No  . Sexual activity: Yes    Birth control/protection: Condom  Other Topics Concern  . None  Social History Narrative  . None    Family History  Problem Relation Age of Onset  . Coronary artery disease Father        Had CABG, and angioplasty around age 41  . Diabetes Father   . Sarcoidosis Mother [id number]  . Asthma Mother   . Hyperlipidemia Mother   . Hypertension Mother   . Pulmonary fibrosis Sister 30  . Asthma Sister     Review of Systems  Constitutional: Negative for chills and fever.  Respiratory: Negative for cough, shortness of breath and wheezing.   Cardiovascular: Negative for chest pain, palpitations and leg swelling.  Musculoskeletal: Negative for myalgias.  Neurological: Positive for headaches (sinus ). Negative for light-headedness.       Objective:   Vitals:   01/22/17 0858  BP: 114/78  Pulse: 88  Resp: 16  Temp: 98 F (36.7 C)  SpO2: 97%   Wt Readings from Last 3 Encounters:  01/22/17 184 lb (83.5 kg)  10/22/16 188 lb (85.3 kg)  07/19/16 189 lb (85.7 kg)   Body mass  index is 31.58 kg/m.   Physical Exam    Constitutional: Appears well-developed and well-nourished. No distress.  HENT:  Head: Normocephalic and atraumatic.  Neck: Neck supple. No tracheal deviation present. No thyromegaly present.  No cervical lymphadenopathy Cardiovascular: Normal rate, regular rhythm and normal heart sounds.   No murmur heard. No carotid bruit .  No edema Pulmonary/Chest: Effort normal and breath sounds normal. No respiratory distress. No has no wheezes. No rales.  Skin: Skin is warm and dry. Not diaphoretic.  Psychiatric: Normal mood and affect. Behavior is normal.  Assessment & Plan:    See Problem List for Assessment and Plan of chronic medical problems.

## 2017-01-22 ENCOUNTER — Ambulatory Visit: Payer: BLUE CROSS/BLUE SHIELD | Admitting: Internal Medicine

## 2017-01-22 ENCOUNTER — Encounter: Payer: Self-pay | Admitting: Internal Medicine

## 2017-01-22 ENCOUNTER — Other Ambulatory Visit (INDEPENDENT_AMBULATORY_CARE_PROVIDER_SITE_OTHER): Payer: BLUE CROSS/BLUE SHIELD

## 2017-01-22 VITALS — BP 114/78 | HR 88 | Temp 98.0°F | Resp 16 | Wt 184.0 lb

## 2017-01-22 DIAGNOSIS — I10 Essential (primary) hypertension: Secondary | ICD-10-CM | POA: Diagnosis not present

## 2017-01-22 DIAGNOSIS — E119 Type 2 diabetes mellitus without complications: Secondary | ICD-10-CM

## 2017-01-22 DIAGNOSIS — E7849 Other hyperlipidemia: Secondary | ICD-10-CM | POA: Diagnosis not present

## 2017-01-22 DIAGNOSIS — Z23 Encounter for immunization: Secondary | ICD-10-CM | POA: Diagnosis not present

## 2017-01-22 DIAGNOSIS — E1142 Type 2 diabetes mellitus with diabetic polyneuropathy: Secondary | ICD-10-CM | POA: Diagnosis not present

## 2017-01-22 LAB — COMPREHENSIVE METABOLIC PANEL
ALBUMIN: 4.2 g/dL (ref 3.5–5.2)
ALT: 12 U/L (ref 0–35)
AST: 9 U/L (ref 0–37)
Alkaline Phosphatase: 71 U/L (ref 39–117)
BILIRUBIN TOTAL: 0.5 mg/dL (ref 0.2–1.2)
BUN: 8 mg/dL (ref 6–23)
CO2: 27 mEq/L (ref 19–32)
Calcium: 9.7 mg/dL (ref 8.4–10.5)
Chloride: 103 mEq/L (ref 96–112)
Creatinine, Ser: 0.71 mg/dL (ref 0.40–1.20)
GFR: 116.59 mL/min (ref >60.00)
Glucose, Bld: 127 mg/dL — ABNORMAL HIGH (ref 70–99)
POTASSIUM: 3.8 meq/L (ref 3.5–5.1)
SODIUM: 140 meq/L (ref 135–145)
TOTAL PROTEIN: 7.6 g/dL (ref 6.0–8.3)

## 2017-01-22 LAB — HEMOGLOBIN A1C: Hgb A1c MFr Bld: 9.8 % — ABNORMAL HIGH (ref 4.6–6.5)

## 2017-01-22 MED ORDER — VITAMIN D 1000 UNITS PO TABS: 1000.0000 [IU] | ORAL_TABLET | Freq: Every day | ORAL | Status: DC

## 2017-01-22 NOTE — Assessment & Plan Note (Addendum)
Sugars better controlled at home Check A1c Plans on starting to exercise routinely in January-stressed the importance of this Continue weight loss efforts Continue diabetic diet Will schedule an eye appointment

## 2017-01-22 NOTE — Assessment & Plan Note (Addendum)
Sugars better controlled Neuropathy is better and currently controlled Continue current doses of Cymbalta and gabapentin

## 2017-01-22 NOTE — Assessment & Plan Note (Signed)
Not fasting so we will hold off on checking lipids CMP Continue statin Continue weight loss efforts Continue healthy diet

## 2017-01-22 NOTE — Assessment & Plan Note (Signed)
BP well controlled Current regimen effective and well tolerated Continue current medications at current doses cmp  

## 2017-01-22 NOTE — Patient Instructions (Signed)
  Test(s) ordered today. Your results will be released to MyChart (or called to you) after review, usually within 72hours after test completion. If any changes need to be made, you will be notified at that same time.  All other Health Maintenance issues reviewed.   All recommended immunizations and age-appropriate screenings are up-to-date or discussed.  Flu immunization administered today.   Medications reviewed and updated.  No changes recommended at this time.   Please followup in 4 months   

## 2017-01-23 ENCOUNTER — Other Ambulatory Visit: Payer: Self-pay | Admitting: Emergency Medicine

## 2017-01-23 MED ORDER — DULAGLUTIDE 1.5 MG/0.5ML ~~LOC~~ SOAJ: 1.5000 mg | SUBCUTANEOUS | 5 refills | Status: DC

## 2017-01-25 ENCOUNTER — Other Ambulatory Visit: Payer: Self-pay | Admitting: Emergency Medicine

## 2017-01-25 MED ORDER — DULOXETINE HCL 30 MG PO CPEP: ORAL_CAPSULE | ORAL | 5 refills | Status: DC

## 2017-02-06 ENCOUNTER — Other Ambulatory Visit: Payer: Self-pay | Admitting: Internal Medicine

## 2017-02-20 ENCOUNTER — Other Ambulatory Visit: Payer: Self-pay | Admitting: Internal Medicine

## 2017-03-18 ENCOUNTER — Other Ambulatory Visit: Payer: Self-pay | Admitting: Internal Medicine

## 2017-03-22 ENCOUNTER — Other Ambulatory Visit: Payer: Self-pay | Admitting: Internal Medicine

## 2017-03-25 ENCOUNTER — Other Ambulatory Visit: Payer: Self-pay | Admitting: Internal Medicine

## 2017-04-15 ENCOUNTER — Other Ambulatory Visit: Payer: Self-pay | Admitting: Internal Medicine

## 2017-05-21 NOTE — Patient Instructions (Addendum)
Your a1c was checked today  All other Health Maintenance issues reviewed.   All recommended immunizations and age-appropriate screenings are up-to-date or discussed.  No immunizations administered today.   Medications reviewed and updated.  No changes recommended at this time.    Please followup in 3 months

## 2017-05-21 NOTE — Progress Notes (Signed)
Subjective:    Patient ID: Emily [name], female    DOB: 1975/10/27, 42 y.o.   MRN: [id number]  HPI The patient is here for follow up.  Hypertension: She is taking her medication daily. She is compliant with a low sodium diet.  She denies chest pain, palpitations, edema, shortness of breath and regular headaches. She is exercising regularly - goes to gym - walking.      Diabetes with neuropathy: She is taking her medication daily as prescribed. She is fairly compliant with a diabetic diet. She is exercising regularly.  She checks her feet daily and denies foot lesions. She is not up-to-date with an ophthalmology examination.   Hyperlipidemia: She is taking her medication daily. She is compliant with a low fat/cholesterol diet. She is exercising regularly. She denies myalgias.   Left knee pain:  She denies injuries.  Getting down on the floor and then getting up can be difficult.  Going up stairs can be difficult. She denies swelling.  It started a while ago.   Her anxiety and fibromyalgia are not ideally controlled and she wonders about increasing her cymbalta.   The medication does help.   Medications and allergies reviewed with patient and updated if appropriate.  Patient Active Problem List   Diagnosis Date Noted  . Left knee pain 05/22/2017  . Hyperlipidemia 01/21/2017  . Hypertension 07/19/2016  . Vitamin D deficiency 05/15/2016  . Vertigo 04/02/2016  . Diabetic neuropathy (HCC) 04/02/2016  . Premenopausal menorrhagia 05/04/2011  . Dysfunctional uterine bleeding 03/14/2011  . Lumbar radiculopathy 07/17/2010  . Diabetes (HCC) 05/20/2009    Current Outpatient Medications on File Prior to Visit  Medication Sig Dispense Refill  . albuterol (PROVENTIL HFA;VENTOLIN HFA) 108 (90 BASE) MCG/ACT inhaler Inhale 2 puffs into the lungs every 4 (four) hours as needed for wheezing.    Marland Kitchen atorvastatin (LIPITOR) 20 MG tablet Take 1 tablet (20 mg total) by mouth daily. 90 tablet 3  .  cholecalciferol (VITAMIN D) 1000 units tablet Take 1 tablet (1,000 Units total) by mouth daily.    . DimenhyDRINATE (MOTION SICKNESS PO) Take 1 tablet by mouth every 6 (six) hours as needed (vertigo, diziness).    . Dulaglutide (TRULICITY) 1.5 MG/0.5ML SOPN Inject 1.5 mg into the skin once a week. 4 pen 5  . DULoxetine (CYMBALTA) 30 MG capsule TAKE 1 CAPSULE BY MOUTH EVERY DAY 30 capsule 5  . gabapentin (NEURONTIN) 100 MG capsule Take 100 mg in evening in addition to 300 mg, increase to 200 mg in evening in addition to 300 mg if tolerated 60 capsule 3  . gabapentin (NEURONTIN) 300 MG capsule TAKE 1 CAPSULE BY MOUTH 3 TIMES DAILY 90 capsule 0  . glipiZIDE (GLUCOTROL XL) 10 MG 24 hr tablet TAKE 1 TABLET BY MOUTH EVERY DAY 90 tablet 0  . JARDIANCE 25 MG TABS tablet TAKE 1 TABLET BY MOUTH EVERY DAY 30 tablet 2  . loperamide (IMODIUM) 2 MG capsule Take 1 capsule (2 mg total) by mouth 4 (four) times daily as needed for diarrhea or loose stools. 12 capsule 0  . meclizine (ANTIVERT) 25 MG tablet Take 1 tablet (25 mg total) by mouth 3 (three) times daily as needed for dizziness. 30 tablet 5  . medroxyPROGESTERone (DEPO-PROVERA) 150 MG/ML injection Inject 150 mg into the muscle every 3 (three) months.      . meloxicam (MOBIC) 15 MG tablet Take 1 tablet (15 mg total) by mouth daily. 30 tablet 0  . metFORMIN (GLUCOPHAGE)  1000 MG tablet Take 1 tablet (1,000 mg total) by mouth 2 (two) times daily. (Patient taking differently: Take 1,000 mg by mouth daily with breakfast. ) 180 tablet 3  . metoprolol tartrate (LOPRESSOR) 25 MG tablet TAKE 1 TABLET BY MOUTH EVERY DAY 90 tablet 1  . ondansetron (ZOFRAN) 4 MG tablet TAKE 1 TABLET BY MOUTH EVERY 6 HOURS 12 tablet 0   No current facility-administered medications on file prior to visit.     Past Medical History:  Diagnosis Date  . ANXIETY   . Anxiety 05/18/2008   Qualifier: Diagnosis of  By: Shawnie Pons MD, Kenney Houseman    . DEPRESSION   . Depression 07/20/2008   Qualifier:  Diagnosis of  By: Shawnie Pons MD, Kenney Houseman    . DIABETES MELLITUS, TYPE II dx 04/2009  . GERD   . Hyperlipidemia 01/21/2017  . Hypertension   . Obesity, unspecified   . PANIC DISORDER     Past Surgical History:  Procedure Laterality Date  . NO PAST SURGERIES    . SPINE SURGERY  07/20/2010    Social History   Socioeconomic History  . Marital status: Single    Spouse name: Not on file  . Number of children: Not on file  . Years of education: Not on file  . Highest education level: Not on file  Occupational History  . Not on file  Social Needs  . Financial resource strain: Not on file  . Food insecurity:    Worry: Not on file    Inability: Not on file  . Transportation needs:    Medical: Not on file    Non-medical: Not on file  Tobacco Use  . Smoking status: Never Smoker  . Smokeless tobacco: Never Used  . Tobacco comment: Has 21 yt old son Dorma Russell who lives with her. (Autism & ADD) Work at [phone]  Substance and Sexual Activity  . Alcohol use: No  . Drug use: No  . Sexual activity: Yes    Birth control/protection: Condom  Lifestyle  . Physical activity:    Days per week: Not on file    Minutes per session: Not on file  . Stress: Not on file  Relationships  . Social connections:    Talks on phone: Not on file    Gets together: Not on file    Attends religious service: Not on file    Active member of club or organization: Not on file    Attends meetings of clubs or organizations: Not on file    Relationship status: Not on file  Other Topics Concern  . Not on file  Social History Narrative  . Not on file    Family History  Problem Relation Age of Onset  . Coronary artery disease Father        Had CABG, and angioplasty around age 56  . Diabetes Father   . Sarcoidosis Mother 36  . Asthma Mother   . Hyperlipidemia Mother   . Hypertension Mother   . Pulmonary fibrosis Sister 30  . Asthma Sister     Review of Systems  Constitutional: Negative for chills  and fever.  Respiratory: Negative for cough, shortness of breath and wheezing.   Cardiovascular: Negative for chest pain, palpitations and leg swelling.  Musculoskeletal: Positive for arthralgias.  Neurological: Negative for light-headedness and headaches.       Objective:   Vitals:   05/22/17 0754  BP: 118/80  Pulse: 97  Resp: 16  Temp: 98.2 F (36.8 C)  SpO2: 96%   BP Readings from Last 3 Encounters:  05/22/17 118/80  01/22/17 114/78  10/22/16 124/78   Wt Readings from Last 3 Encounters:  05/22/17 182 lb (82.6 kg)  01/22/17 184 lb (83.5 kg)  10/22/16 188 lb (85.3 kg)   Body mass index is 31.24 kg/m.   Physical Exam    Constitutional: Appears well-developed and well-nourished. No distress.  HENT:  Head: Normocephalic and atraumatic.  Neck: Neck supple. No tracheal deviation present. No thyromegaly present.  No cervical lymphadenopathy Cardiovascular: Normal rate, regular rhythm and normal heart sounds.   No murmur heard. No carotid bruit .  No edema Pulmonary/Chest: Effort normal and breath sounds normal. No respiratory distress. No has no wheezes. No rales.  Skin: Skin is warm and dry. Not diaphoretic.  Psychiatric: Normal mood and affect. Behavior is normal.      Assessment & Plan:    See Problem List for Assessment and Plan of chronic medical problems.

## 2017-05-22 ENCOUNTER — Ambulatory Visit: Payer: BLUE CROSS/BLUE SHIELD | Admitting: Internal Medicine

## 2017-05-22 ENCOUNTER — Other Ambulatory Visit (INDEPENDENT_AMBULATORY_CARE_PROVIDER_SITE_OTHER): Payer: BLUE CROSS/BLUE SHIELD

## 2017-05-22 ENCOUNTER — Encounter: Payer: Self-pay | Admitting: Internal Medicine

## 2017-05-22 VITALS — BP 118/80 | HR 97 | Temp 98.2°F | Resp 16 | Wt 182.0 lb

## 2017-05-22 DIAGNOSIS — M810 Age-related osteoporosis without current pathological fracture: Secondary | ICD-10-CM

## 2017-05-22 DIAGNOSIS — E7849 Other hyperlipidemia: Secondary | ICD-10-CM

## 2017-05-22 DIAGNOSIS — I10 Essential (primary) hypertension: Secondary | ICD-10-CM

## 2017-05-22 DIAGNOSIS — M25562 Pain in left knee: Secondary | ICD-10-CM | POA: Diagnosis not present

## 2017-05-22 DIAGNOSIS — M797 Fibromyalgia: Secondary | ICD-10-CM

## 2017-05-22 DIAGNOSIS — E119 Type 2 diabetes mellitus without complications: Secondary | ICD-10-CM

## 2017-05-22 DIAGNOSIS — E1142 Type 2 diabetes mellitus with diabetic polyneuropathy: Secondary | ICD-10-CM | POA: Diagnosis not present

## 2017-05-22 DIAGNOSIS — F419 Anxiety disorder, unspecified: Secondary | ICD-10-CM

## 2017-05-22 LAB — COMPREHENSIVE METABOLIC PANEL
ALT: 14 U/L (ref 0–35)
AST: 9 U/L (ref 0–37)
Albumin: 4.1 g/dL (ref 3.5–5.2)
Alkaline Phosphatase: 66 U/L (ref 39–117)
BUN: 9 mg/dL (ref 6–23)
CALCIUM: 9.9 mg/dL (ref 8.4–10.5)
CHLORIDE: 104 meq/L (ref 96–112)
CO2: 24 meq/L (ref 19–32)
Creatinine, Ser: 0.61 mg/dL (ref 0.40–1.20)
GFR: 138.68 mL/min (ref >60.00)
GLUCOSE: 157 mg/dL — AB (ref 70–99)
Potassium: 4.3 mEq/L (ref 3.5–5.1)
Sodium: 138 mEq/L (ref 135–145)
Total Bilirubin: 0.4 mg/dL (ref 0.2–1.2)
Total Protein: 7.7 g/dL (ref 6.0–8.3)

## 2017-05-22 LAB — CBC WITH DIFFERENTIAL/PLATELET
BASOS PCT: 0.5 % (ref 0.0–3.0)
Basophils Absolute: 0.1 10*3/uL (ref 0.0–0.1)
EOS PCT: 0.5 % (ref 0.0–5.0)
Eosinophils Absolute: 0 10*3/uL (ref 0.0–0.7)
HCT: 45.3 % (ref 36.0–46.0)
HEMOGLOBIN: 15.1 g/dL — AB (ref 12.0–15.0)
LYMPHS ABS: 3.4 10*3/uL (ref 0.7–4.0)
Lymphocytes Relative: 33.8 % (ref 12.0–46.0)
MCHC: 33.4 g/dL (ref 30.0–36.0)
MCV: 91.4 fl (ref 78.0–100.0)
MONO ABS: 0.7 10*3/uL (ref 0.1–1.0)
MONOS PCT: 6.6 % (ref 3.0–12.0)
Neutro Abs: 5.9 10*3/uL (ref 1.4–7.7)
Neutrophils Relative %: 58.6 % (ref 43.0–77.0)
Platelets: 305 10*3/uL (ref 150.0–400.0)
RBC: 4.96 Mil/uL (ref 3.87–5.11)
RDW: 13.7 % (ref 11.5–15.5)
WBC: 10 10*3/uL (ref 4.0–10.5)

## 2017-05-22 LAB — LIPID PANEL
CHOLESTEROL: 93 mg/dL (ref 0–200)
HDL: 30.3 mg/dL — ABNORMAL LOW (ref >39.00)
LDL CALC: 31 mg/dL (ref 0–99)
NONHDL: 62.39
Total CHOL/HDL Ratio: 3
Triglycerides: 158 mg/dL — ABNORMAL HIGH (ref 0.0–149.0)
VLDL: 31.6 mg/dL (ref 0.0–40.0)

## 2017-05-22 LAB — HEMOGLOBIN A1C: HEMOGLOBIN A1C: 9.3 % — AB (ref 4.6–6.5)

## 2017-05-22 LAB — VITAMIN D 25 HYDROXY (VIT D DEFICIENCY, FRACTURES): VITD: 29.74 ng/mL — AB (ref 30.00–100.00)

## 2017-05-22 MED ORDER — DULOXETINE HCL 60 MG PO CPEP: 60.0000 mg | ORAL_CAPSULE | Freq: Every day | ORAL | 3 refills | Status: AC

## 2017-05-22 NOTE — Assessment & Plan Note (Signed)
Check a1c Low sugar / carb diet Stressed regular exercise, weight loss  

## 2017-05-22 NOTE — Assessment & Plan Note (Signed)
BP well controlled Current regimen effective and well tolerated Continue current medications at current doses cmp  

## 2017-05-22 NOTE — Assessment & Plan Note (Addendum)
Check lipid panel  Continue daily statin Regular exercise and healthy diet encouraged  

## 2017-05-22 NOTE — Assessment & Plan Note (Signed)
Refer to sports med for further evaluation/treatment

## 2017-05-22 NOTE — Assessment & Plan Note (Signed)
Not ideally controlled Wants to increase cymbalta Increase to 60 mg daily Stressed increasing exercise

## 2017-05-22 NOTE — Assessment & Plan Note (Signed)
Not ideally controlled Wants to increase cymbalta Increase to 60 mg daily

## 2017-05-22 NOTE — Assessment & Plan Note (Signed)
stresssed increasing exercise Check vitamin d level

## 2017-05-22 NOTE — Assessment & Plan Note (Signed)
Inc cymbalta to 60 mg daily Continue neurontin

## 2017-05-23 ENCOUNTER — Encounter: Payer: Self-pay | Admitting: Internal Medicine

## 2017-05-24 NOTE — Telephone Encounter (Signed)
-----   Message from Pincus SanesStacy J Burns, MD sent at 05/23/2017  4:55 PM EDT ----- Cholesterol is good. Kidney and liver tests are normal.  Blood counts normal.  Vitamin d level slightly low - she can increase her vitamin d intake slightly - 2000 units daily.  Her sugars are better controlled but still too high - her a1c is 9.3%  We need to add onglyza 5 mg daily and increase metformin - add a 500 mg (1/2 tab ) at lunch.  She needs to do more with lifestyle if she wants to avoid insulin.

## 2017-05-25 MED ORDER — GLIPIZIDE ER 10 MG PO TB24: 20.0000 mg | ORAL_TABLET | Freq: Every day | ORAL | 0 refills | Status: DC

## 2017-05-25 MED ORDER — SAXAGLIPTIN HCL 5 MG PO TABS: 5.0000 mg | ORAL_TABLET | Freq: Every day | ORAL | 5 refills | Status: DC

## 2017-05-25 MED ORDER — METFORMIN HCL 1000 MG PO TABS: ORAL_TABLET | ORAL | 3 refills | Status: DC

## 2017-05-25 NOTE — Addendum Note (Signed)
Addended by: Pincus SanesBURNS, STACY J on: 05/25/2017 09:09 AM   Modules accepted: Orders

## 2017-06-04 ENCOUNTER — Telehealth: Payer: Self-pay | Admitting: Emergency Medicine

## 2017-06-04 NOTE — Telephone Encounter (Signed)
LVM for pt to call back and let us know if she wants to pick up savings card.

## 2017-06-04 NOTE — Telephone Encounter (Signed)
-----   Message from Pincus SanesStacy J Burns, MD sent at 06/03/2017  7:40 AM EDT ----- She can not afford the trulicity - can you call her and see if she wants to come pick up the bydureon savings card -- we can send a prescription in or print one for her -- it is similar to trulicity and she may be able to get this cheap with the savings card.

## 2017-06-18 ENCOUNTER — Other Ambulatory Visit: Payer: Self-pay | Admitting: Internal Medicine

## 2017-06-21 ENCOUNTER — Ambulatory Visit: Payer: BLUE CROSS/BLUE SHIELD | Admitting: Family Medicine

## 2017-06-25 ENCOUNTER — Other Ambulatory Visit: Payer: Self-pay | Admitting: Internal Medicine

## 2017-06-26 ENCOUNTER — Encounter: Payer: Self-pay | Admitting: Internal Medicine

## 2017-07-16 ENCOUNTER — Other Ambulatory Visit: Payer: Self-pay | Admitting: Internal Medicine

## 2017-08-22 ENCOUNTER — Ambulatory Visit: Payer: BLUE CROSS/BLUE SHIELD | Admitting: Internal Medicine

## 2018-04-02 ENCOUNTER — Other Ambulatory Visit: Payer: Self-pay | Admitting: Internal Medicine

## 2018-04-04 ENCOUNTER — Other Ambulatory Visit: Payer: Self-pay | Admitting: Internal Medicine

## 2019-01-11 ENCOUNTER — Other Ambulatory Visit: Payer: Self-pay

## 2019-01-11 DIAGNOSIS — Z20822 Contact with and (suspected) exposure to covid-19: Secondary | ICD-10-CM

## 2019-01-12 LAB — NOVEL CORONAVIRUS, NAA: SARS-CoV-2, NAA: NOT DETECTED

## 2019-06-26 ENCOUNTER — Ambulatory Visit
Admission: EM | Admit: 2019-06-26 | Discharge: 2019-06-26 | Disposition: A | Discharge status: Home / Self Care | Payer: BLUE CROSS/BLUE SHIELD

## 2019-06-26 DIAGNOSIS — G8929 Other chronic pain: Secondary | ICD-10-CM

## 2019-06-26 DIAGNOSIS — M25562 Pain in left knee: Secondary | ICD-10-CM

## 2019-06-26 DIAGNOSIS — M79672 Pain in left foot: Secondary | ICD-10-CM | POA: Diagnosis not present

## 2019-06-26 DIAGNOSIS — R350 Frequency of micturition: Secondary | ICD-10-CM

## 2019-06-26 LAB — POCT URINALYSIS DIP (MANUAL ENTRY)
Bilirubin, UA: NEGATIVE
Glucose, UA: >=1000 mg/dL — AB
Leukocytes, UA: NEGATIVE
Nitrite, UA: NEGATIVE
Protein Ur, POC: NEGATIVE mg/dL
Spec Grav, UA: 1.015 (ref 1.010–1.025)
Urobilinogen, UA: 0.2 E.U./dL
pH, UA: 5 (ref 5.0–8.0)

## 2019-06-26 NOTE — ED Provider Notes (Signed)
[facility]    CSN: [id number] Arrival date & time: 06/26/19  0815      History   Chief Complaint Chief Complaint  Patient presents with  . Foot Pain    HPI Emily [name] is a 44 y.o. female.   44 year old female comes in for multiple complaints.  1.  2-day history of left foot pain. Denies injury/trauma.  States work requires long hours of standing and walking.  Has swelling to the left foot without erythema or warmth.  States has neuropathy with baseline shooting pain, but now with pain to dorsal aspect of distal MTP that is different from normal.  No new numbness or tingling.  2.  Left knee popping Patient states has history of left knee arthritis.  She now has popping sensation/noise when climbing up stairs.  Denies the symptoms when walking.  Denies knee swelling, pain, erythema, warmth.  She takes Mobic 15 mg daily as needed when having pain.  3.  2-day history of left flank pain with urinary frequency, urgency. Denies dysuria, hematuria.  Denies abdominal pain, nausea, vomiting, diarrhea.  Denies vaginal symptoms.  Denies fever.     Past Medical History:  Diagnosis Date  . ANXIETY   . Anxiety 05/18/2008   Qualifier: Diagnosis of  By: Shawnie Pons MD, Kenney Houseman    . DEPRESSION   . Depression 07/20/2008   Qualifier: Diagnosis of  By: Shawnie Pons MD, Kenney Houseman    . DIABETES MELLITUS, TYPE II dx 04/2009  . GERD   . Hyperlipidemia 01/21/2017  . Hypertension   . Obesity, unspecified   . PANIC DISORDER     Patient Active Problem List   Diagnosis Date Noted  . Left knee pain 05/22/2017  . Osteoporosis 05/22/2017  . Fibromyalgia 05/22/2017  . Hyperlipidemia 01/21/2017  . Hypertension 07/19/2016  . Vitamin D deficiency 05/15/2016  . Vertigo 04/02/2016  . Diabetic neuropathy (HCC) 04/02/2016  . Premenopausal menorrhagia 05/04/2011  . Dysfunctional uterine bleeding 03/14/2011  . Lumbar radiculopathy 07/17/2010  . Diabetes (HCC) 05/20/2009  . Anxiety 05/18/2008     Past Surgical History:  Procedure Laterality Date  . NO PAST SURGERIES    . SPINE SURGERY  07/20/2010    OB History    Gravida  2   Para      Term      Preterm      AB  1   Living  1     SAB  1   TAB      Ectopic      Multiple      Live Births               Home Medications    Prior to Admission medications   Medication Sig Start Date End Date Taking? Authorizing Provider  Dulaglutide (TRULICITY) 1.5 MG/0.5ML SOPN Inject into the skin once a week.   Yes [provider]  empagliflozin (JARDIANCE) 25 MG TABS tablet Take 25 mg by mouth daily.   Yes [provider]  albuterol (PROVENTIL HFA;VENTOLIN HFA) 108 (90 BASE) MCG/ACT inhaler Inhale 2 puffs into the lungs every 4 (four) hours as needed for wheezing.    [provider]  atorvastatin (LIPITOR) 20 MG tablet TAKE 1 TABLET BY MOUTH EVERY DAY 07/16/17   Pincus Sanes, MD  cholecalciferol (VITAMIN D) 1000 units tablet Take 1 tablet (1,000 Units total) by mouth daily. 01/22/17   Pincus Sanes, MD  DimenhyDRINATE (MOTION SICKNESS PO) Take 1 tablet by mouth every 6 (six)  hours as needed (vertigo, diziness).    [provider]  DULoxetine (CYMBALTA) 60 MG capsule Take 1 capsule (60 mg total) by mouth daily. 05/22/17   Pincus Sanes, MD  gabapentin (NEURONTIN) 100 MG capsule Take 100 mg in evening in addition to 300 mg, increase to 200 mg in evening in addition to 300 mg if tolerated 04/02/16   Burns, Bobette Mo, MD  gabapentin (NEURONTIN) 300 MG capsule TAKE 1 CAPSULE BY MOUTH 3 TIMES DAILY Patient taking differently: 400 mg 2 (two) times daily.  06/18/17   Pincus Sanes, MD  loperamide (IMODIUM) 2 MG capsule Take 1 capsule (2 mg total) by mouth 4 (four) times daily as needed for diarrhea or loose stools. 03/29/16   Audry Pili, PA-C  meclizine (ANTIVERT) 25 MG tablet Take 1 tablet (25 mg total) by mouth 3 (three) times daily as needed for dizziness. 04/02/16   Pincus Sanes, MD   medroxyPROGESTERone (DEPO-PROVERA) 150 MG/ML injection Inject 150 mg into the muscle every 3 (three) months.      [provider]  meloxicam (MOBIC) 15 MG tablet Take 1 tablet (15 mg total) by mouth daily. 06/08/16   Helane Gunther, DPM  metFORMIN (GLUCOPHAGE) 1000 MG tablet 1000 mg with breakfast, 500 mg with lunch, 1000 mg with dinner 05/25/17   Pincus Sanes, MD  metoprolol tartrate (LOPRESSOR) 25 MG tablet TAKE 1 TABLET BY MOUTH EVERY DAY 07/16/17   Pincus Sanes, MD  ondansetron (ZOFRAN) 4 MG tablet TAKE 1 TABLET BY MOUTH EVERY 6 HOURS 02/06/17   Pincus Sanes, MD    Family History Family History  Problem Relation Age of Onset  . Coronary artery disease Father        Had CABG, and angioplasty around age 100  . Diabetes Father   . Sarcoidosis Mother 77  . Asthma Mother   . Hyperlipidemia Mother   . Hypertension Mother   . Pulmonary fibrosis Sister 30  . Asthma Sister     Social History Social History   Tobacco Use  . Smoking status: Never Smoker  . Smokeless tobacco: Never Used  . Tobacco comment: Has 29 yt old son Dorma Russell who lives with her. (Autism & ADD) Work at [employer]  Substance Use Topics  . Alcohol use: No  . Drug use: No     Allergies   Zolpidem tartrate   Review of Systems Review of Systems  Reason unable to perform ROS: See HPI as above.     Physical Exam Triage Vital Signs ED Triage Vitals  Enc Vitals Group     BP 06/26/19 0822 130/86     Pulse Rate 06/26/19 0822 (!) 105     Resp 06/26/19 0822 20     Temp 06/26/19 0822 97.9 F (36.6 C)     Temp Source 06/26/19 0822 Oral     SpO2 06/26/19 0822 98 %     Weight --      Height --      Head Circumference --      Peak Flow --      Pain Score 06/26/19 0836 6     Pain Loc --      Pain Edu? --      Excl. in GC? --    No data found.  Updated Vital Signs BP 130/86 (BP Location: Left Arm)   Pulse (!) 105   Temp 97.9 F (36.6 C) (Oral)   Resp 20   SpO2 98%  Visual  Acuity Right Eye Distance:   Left Eye Distance:   Bilateral Distance:    Right Eye Near:   Left Eye Near:    Bilateral Near:     Physical Exam Constitutional:      General: She is not in acute distress.    Appearance: Normal appearance. She is well-developed. She is not toxic-appearing or diaphoretic.  HENT:     Head: Normocephalic and atraumatic.  Eyes:     Conjunctiva/sclera: Conjunctivae normal.     Pupils: Pupils are equal, round, and reactive to light.  Pulmonary:     Effort: Pulmonary effort is normal. No respiratory distress.     Comments: Speaking in full sentences without difficulty Musculoskeletal:     Cervical back: Normal range of motion and neck supple.     Comments: 1. Back  No tenderness to palpation of the back/flank. No cva tenderness  2. Knee No swelling, erythema, warmth, contusion. No tenderness to palpation. Full ROM of knee with crepitus felt. Strength 5/5. Sensation intact  3. Foot Mild swelling to the left foot. No swelling to the ankle, lower leg. No erythema, warmth, contusion. Tenderness to distal dorsal foot diffusely. No tenderness to toes. Full ROM of ankle. Strength 5/5. Sensation intact at baseline. Pedal pulse 2+  Skin:    General: Skin is warm and dry.  Neurological:     Mental Status: She is alert and oriented to person, place, and time.      UC Treatments / Results  Labs (all labs ordered are listed, but only abnormal results are displayed) Labs Reviewed  POCT URINALYSIS DIP (MANUAL ENTRY) - Abnormal; Notable for the following components:      Result Value   Clarity, UA cloudy (*)    Glucose, UA >=1,000 (*)    Ketones, POC UA moderate (40) (*)    Blood, UA trace-lysed (*)    All other components within normal limits    EKG   Radiology No results found.  Procedures Procedures (including critical care time)  Medications Ordered in UC Medications - No data to display  Initial Impression / Assessment and Plan / UC Course   I have reviewed the triage vital signs and the nursing notes.  Pertinent labs & imaging results that were available during my care of the patient were reviewed by me and considered in my medical decision making (see chart for details).    1.  Left foot pain Patient already with prescription of Mobic, but has been taking as needed, will start daily for the next 5 to 7 days.  Ice compress, rest, Ace wrap during activity.  Return precautions given.  2.  Left knee pain Chronic atraumatic, with crepitus, likely arthritic in nature.  Will provide knee sleeve for further symptomatic management.  NSAIDs as above.  Follow-up with orthopedics if needed.  3.  Urinary symptoms. Dipstick negative for infection.  Patient's last A1c 13.6, spilling 1000 glucose.  Discussed urinary symptoms could be due to uncontrolled diabetes.  To continue to monitor CBG, and decrease carbohydrate intake.  Return precautions given.  Otherwise to follow-up with PCP for further evaluation.  Patient expresses understanding and agrees to plan.  Final Clinical Impressions(s) / UC Diagnoses   Final diagnoses:  Left foot pain  Chronic pain of left knee  Urinary frequency    ED Prescriptions    None     I have reviewed the PDMP during this encounter.   Ok Edwards, PA-C 06/26/19 1145

## 2019-06-26 NOTE — Discharge Instructions (Signed)
Left foot pain Start mobic daily for the next 5-7 days. Ice compress, rest, ace wrap during activity. Follow up with PCP if symptoms not improving.  Left knee pain Start knee sleeve during activity to see if it helps. Continue mobic. Follow up with orthopedics if symptoms not improving.  Urinary symptoms No signs of infection. Does show sugar. When diabetes not controlled, you can have urinary symptoms. Please continue to monitor your sugar. If urinary symptoms does not resolve even with better control of sugar, please follow up with PCP for further evaluation.

## 2019-06-26 NOTE — ED Triage Notes (Signed)
Pt c/o lt foot pain x2 days and started swelling last night. States stands on her feet all day. Pt c/o lt knee pain when going up stairs and has popping sounds x 2days. Pt also c/o lt flank pain x2 days with urinary frequency and urgency.

## 2019-06-27 ENCOUNTER — Emergency Department (HOSPITAL_COMMUNITY): Payer: BLUE CROSS/BLUE SHIELD

## 2019-06-27 ENCOUNTER — Encounter (HOSPITAL_COMMUNITY): Payer: Self-pay | Admitting: Emergency Medicine

## 2019-06-27 ENCOUNTER — Emergency Department (HOSPITAL_COMMUNITY)
Admission: EM | Admit: 2019-06-27 | Discharge: 2019-06-27 | Disposition: A | Discharge status: Home / Self Care | Payer: BLUE CROSS/BLUE SHIELD | Attending: Emergency Medicine | Admitting: Emergency Medicine

## 2019-06-27 ENCOUNTER — Other Ambulatory Visit: Payer: Self-pay

## 2019-06-27 DIAGNOSIS — Z7984 Long term (current) use of oral hypoglycemic drugs: Secondary | ICD-10-CM | POA: Diagnosis not present

## 2019-06-27 DIAGNOSIS — Z79899 Other long term (current) drug therapy: Secondary | ICD-10-CM | POA: Insufficient documentation

## 2019-06-27 DIAGNOSIS — S22080A Wedge compression fracture of T11-T12 vertebra, initial encounter for closed fracture: Secondary | ICD-10-CM | POA: Diagnosis not present

## 2019-06-27 DIAGNOSIS — M545 Low back pain, unspecified: Secondary | ICD-10-CM

## 2019-06-27 DIAGNOSIS — E119 Type 2 diabetes mellitus without complications: Secondary | ICD-10-CM | POA: Insufficient documentation

## 2019-06-27 DIAGNOSIS — I10 Essential (primary) hypertension: Secondary | ICD-10-CM | POA: Diagnosis not present

## 2019-06-27 DIAGNOSIS — Y929 Unspecified place or not applicable: Secondary | ICD-10-CM | POA: Diagnosis not present

## 2019-06-27 DIAGNOSIS — W19XXXA Unspecified fall, initial encounter: Secondary | ICD-10-CM | POA: Diagnosis not present

## 2019-06-27 DIAGNOSIS — Y939 Activity, unspecified: Secondary | ICD-10-CM | POA: Insufficient documentation

## 2019-06-27 DIAGNOSIS — S3992XA Unspecified injury of lower back, initial encounter: Secondary | ICD-10-CM | POA: Diagnosis present

## 2019-06-27 DIAGNOSIS — Y999 Unspecified external cause status: Secondary | ICD-10-CM | POA: Insufficient documentation

## 2019-06-27 MED ORDER — LIDOCAINE 5 % EX PTCH: 1.0000 | MEDICATED_PATCH | CUTANEOUS | 0 refills | Status: DC

## 2019-06-27 MED ORDER — METHOCARBAMOL 500 MG PO TABS
500.0000 mg | ORAL_TABLET | Freq: Three times a day (TID) | ORAL | 0 refills | Status: DC | PRN
Start: 2019-06-27 — End: 2021-06-13

## 2019-06-27 MED ORDER — HYDROCODONE-ACETAMINOPHEN 5-325 MG PO TABS
1.0000 | ORAL_TABLET | Freq: Once | ORAL | Status: AC
  Administered 2019-06-27: 1 via ORAL
  Filled 2019-06-27: qty 1

## 2019-06-27 MED ORDER — OXYCODONE-ACETAMINOPHEN 5-325 MG PO TABS
1.0000 | ORAL_TABLET | Freq: Once | ORAL | Status: AC
  Administered 2019-06-27: 1 via ORAL
  Filled 2019-06-27: qty 1

## 2019-06-27 NOTE — ED Notes (Signed)
Paged ortho RE  [facility]

## 2019-06-27 NOTE — ED Provider Notes (Signed)
44 year old female presents for evaluation mechanical fall earlier today.  Care transferred from previous provider, Petrucelli, PA-C at shift change.  See note for full HPI.  In summation fall earlier today where she fell backwards.  Denies hitting her head, LOC or anticoagulation.  Admits to some thoracic and lumbar pain.  Worse with movement.  No weakness, paresthesias, incontinence, hematuria, chest pain, abdominal pain, headache, lightheadedness or dizziness.  Plan on plain films thoracic and lumbar.  If plain films negative plan to DC home with Robaxin.   Physical Exam  BP (!) 145/99 (BP Location: Right Arm)   Pulse (!) 101   Temp 98.5 F (36.9 C) (Oral)   Resp 14   Ht 5\' 4"  (1.626 m)   Wt 75.8 kg   SpO2 96%   BMI 28.67 kg/m   Physical Exam Vitals and nursing note reviewed.  Constitutional:      General: She is not in acute distress.    Appearance: She is well-developed.  HENT:     Head: Atraumatic.  Eyes:     Pupils: Pupils are equal, round, and reactive to light.  Cardiovascular:     Rate and Rhythm: Normal rate.  Pulmonary:     Effort: No respiratory distress.  Abdominal:     General: There is no distension.  Musculoskeletal:        General: Normal range of motion.     Cervical back: Normal range of motion.     Comments: Focal tenderness T11-L1.  Was some paraspinal tenderness.  No crepitus or step-offs.  Pelvis stable, nontender palpation.  Skin:    General: Skin is warm and dry.     Comments: Brisk capillary refill  Neurological:     General: No focal deficit present.     Mental Status: She is alert.     Sensory: Sensation is intact.     Motor: Motor function is intact.     Coordination: Coordination is intact.     Gait: Gait is intact.     Comments: Intact sensation to bilateral lower extremities 5/5 strength of bilateral lower extremities     ED Course/Procedures     Procedures Labs Reviewed - No data to display  DG Thoracic Spine 2  View  Result Date: 06/27/2019 CLINICAL DATA:  Fall, back pain. EXAM: THORACIC SPINE 2 VIEWS COMPARISON:  Chest radiograph 06/13/2015 FINDINGS: Interval 44% superior endplate compression fracture at T12, otherwise age is indeterminate. Mild spurring at the T9-10 level eccentric to the right. No malalignment. Chronic T5 wedge compression fracture, no change from 06/13/2015. IMPRESSION: 1. Interval 44% superior endplate compression fracture at T12, otherwise age indeterminate. 2. Chronic T5 wedge compression fracture. 3. Mild spondylosis at T9-10. Electronically Signed   By: Van Clines M.D.   On: 06/27/2019 16:30   DG Lumbar Spine Complete  Result Date: 06/27/2019 CLINICAL DATA:  Fall onto back EXAM: [place] 4+ VIEW COMPARISON:  07/20/2010 and 07/16/2010 MRI FINDINGS: 44% superior endplate compression at T12, not present on 06/13/2015, and possibly acute or subacute. The lumbar vertebra appear unremarkable. IMPRESSION: 44% superior endplate compression fracture at T12, age indeterminate but new compared to 06/13/2015. This is age indeterminate. Electronically Signed   By: Van Clines M.D.   On: 06/27/2019 16:29   CT Thoracic Spine Wo Contrast  Result Date: 06/27/2019 CLINICAL DATA:  Fall, back pain. T12 compression fracture. EXAM: CT THORACIC SPINE WITHOUT CONTRAST TECHNIQUE: Multidetector CT images of the thoracic were obtained using the standard protocol without  intravenous contrast. COMPARISON:  Radiographs from 06/27/2019 FINDINGS: Alignment: No vertebral subluxation is observed. Vertebrae: 44% superior endplate compression fracture at T12 is likely subacute or acute. 2 mm posterior bony retropulsion along the posterosuperior margin of T12. No significant paraspinal hematoma. 45% compression fracture at T5, chronic. 20% inferior endplate compression fracture at T3, chronic. Broad Schmorl's node along the inferior endplate of T7. Paraspinal and other soft tissues: Left main and  left anterior descending coronary artery atherosclerosis. Disc levels: No well-defined bony impingement. IMPRESSION: 1. 44% superior endplate compression fracture at T12 is likely subacute or acute. 2 mm posterior bony retropulsion along the posterosuperior margin of T12. 2. 20% inferior endplate compression fracture at T3, chronic. 45% chronic compression fracture at T5. 3. Left main and left anterior descending coronary artery atherosclerosis. Electronically Signed   By: Gaylyn Rong M.D.   On: 06/27/2019 18:7   MDM  44 year old with mechanical fall earlier today.  Care transferred from [facility], PA-C at shift change.  She has a nonfocal neuro exam.  Full range of motion however diffuse tenderness throughout thoracic and lumbar region including paraspinal muscles.  Low suspicion for acute neurosurgical emergency.  Plan from previous provider was to obtain plain film thoracic and lumbar and reassess.  If plain films negative likely DC home with Robaxin.  Patient reassessed.  Plain films personally reviewed and interpreted which show possible acute compression fracture at T12.  Will obtain CT imaging to reassess.  Patient reassessed.  Has not needed anything for pain.  Discussed CT imaging with acute compression fracture with 20% height reduction.  Will place in T LSO brace and have her follow-up with neurosurgery.  Low suspicion for acute neurosurgical emergency.  The patient has been appropriately medically screened and/or stabilized in the ED. I have low suspicion for any other emergent medical condition which would require further screening, evaluation or treatment in the ED or require inpatient management.  Patient is hemodynamically stable and in no acute distress.  Patient able to ambulate in department prior to ED.  Evaluation does not show acute pathology that would require ongoing or additional emergent interventions while in the emergency department or further inpatient treatment.  I  have discussed the diagnosis with the patient and answered all questions.  Pain is been managed while in the emergency department and patient has no further complaints prior to discharge.  Patient is comfortable with plan discussed in room and is stable for discharge at this time.  I have discussed strict return precautions for returning to the emergency department.  Patient was encouraged to follow-up with PCP/specialist refer to at discharge.      Omare Bilotta A, PA-C 06/27/19 1950    Pricilla Loveless, MD 06/29/19 [phone]

## 2019-06-27 NOTE — ED Triage Notes (Signed)
[place] [facility].  She fell on her back after picking a child up.  C/o thoracic and lumbar pain.  No radiation to legs.  Pain is with movement and palpation.  CBG 331.

## 2019-06-27 NOTE — ED Notes (Signed)
Ortho tech was not called by provider for the back splint  That has to come from outside the hospital  They will call now

## 2019-06-27 NOTE — Progress Notes (Signed)
Orthopedic Tech Progress Note Patient Details:  Emily [name] [birth year] [id number]  Patient ID: Emily [name], female   DOB: [birth year], 44 y.o.   MRN: [id number] [facility]  Trinna Post 06/27/2019, 9:44 PM

## 2019-06-27 NOTE — ED Provider Notes (Signed)
[facility] EMERGENCY DEPARTMENT Provider Note   CSN: [id number] Arrival date & time: 06/27/19  1228     History Chief Complaint  Patient presents with  . Back Pain    Emily [name] is a 44 y.o. female with a history of anxiety, depression, hyperlipidemia, T2DM, fibromyalgia, osteoporosis, and lumbar radiculopathy who presents tot he ED s/p mechanical fall shortly PTA with complaints of back pain. Patient states as she tried to pick up a child she and the child fell backwards. She states she landed on her lower back. Denies head injury or LOC. States she needed assistance to get up and has been ambulatory with some discomfort since. No other areas of pain. Pain is a 10/10 in severity, worse with movement, no alleviating factors.  Denies numbness, tingling, weakness, incontinence, hematuria, chest pain, abdominal pain, or headache. She states she does not get her menstrual period and denies any chance of pregnancy.   HPI     Past Medical History:  Diagnosis Date  . ANXIETY   . Anxiety 05/18/2008   Qualifier: Diagnosis of  By: Shawnie Pons MD, Kenney Houseman    . DEPRESSION   . Depression 07/20/2008   Qualifier: Diagnosis of  By: Shawnie Pons MD, Kenney Houseman    . DIABETES MELLITUS, TYPE II dx 04/2009  . GERD   . Hyperlipidemia 01/21/2017  . Hypertension   . Obesity, unspecified   . PANIC DISORDER     Patient Active Problem List   Diagnosis Date Noted  . Left knee pain 05/22/2017  . Osteoporosis 05/22/2017  . Fibromyalgia 05/22/2017  . Hyperlipidemia 01/21/2017  . Hypertension 07/19/2016  . Vitamin D deficiency 05/15/2016  . Vertigo 04/02/2016  . Diabetic neuropathy (HCC) 04/02/2016  . Premenopausal menorrhagia 05/04/2011  . Dysfunctional uterine bleeding 03/14/2011  . Lumbar radiculopathy 07/17/2010  . Diabetes (HCC) 05/20/2009  . Anxiety 05/18/2008    Past Surgical History:  Procedure Laterality Date  . NO PAST SURGERIES    . SPINE SURGERY  07/20/2010     OB History    Gravida  2   Para      Term      Preterm      AB  1   Living  1     SAB  1   TAB      Ectopic      Multiple      Live Births              Family History  Problem Relation Age of Onset  . Coronary artery disease Father        Had CABG, and angioplasty around age 42  . Diabetes Father   . Sarcoidosis Mother 63  . Asthma Mother   . Hyperlipidemia Mother   . Hypertension Mother   . Pulmonary fibrosis Sister 30  . Asthma Sister     Social History   Tobacco Use  . Smoking status: Never Smoker  . Smokeless tobacco: Never Used  . Tobacco comment: Has 7 yt old son Dorma Russell who lives with her. (Autism & ADD) Work at [employer]  Substance Use Topics  . Alcohol use: No  . Drug use: No    Home Medications Prior to Admission medications   Medication Sig Start Date End Date Taking? Authorizing Provider  albuterol (PROVENTIL HFA;VENTOLIN HFA) 108 (90 BASE) MCG/ACT inhaler Inhale 2 puffs into the lungs every 4 (four) hours as needed for wheezing.    [provider]  atorvastatin (LIPITOR) 20 MG  tablet TAKE 1 TABLET BY MOUTH EVERY DAY 07/16/17   Binnie Rail, MD  cholecalciferol (VITAMIN D) 1000 units tablet Take 1 tablet (1,000 Units total) by mouth daily. 01/22/17   Binnie Rail, MD  DimenhyDRINATE (MOTION SICKNESS PO) Take 1 tablet by mouth every 6 (six) hours as needed (vertigo, diziness).    [provider]  Dulaglutide (TRULICITY) 1.5 PJ/0.9TO SOPN Inject into the skin once a week.    [provider]  DULoxetine (CYMBALTA) 60 MG capsule Take 1 capsule (60 mg total) by mouth daily. 05/22/17   Binnie Rail, MD  empagliflozin (JARDIANCE) 25 MG TABS tablet Take 25 mg by mouth daily.    [provider]  gabapentin (NEURONTIN) 100 MG capsule Take 100 mg in evening in addition to 300 mg, increase to 200 mg in evening in addition to 300 mg if tolerated 04/02/16   Burns, Claudina Lick, MD  gabapentin (NEURONTIN) 300 MG capsule TAKE 1  CAPSULE BY MOUTH 3 TIMES DAILY Patient taking differently: 400 mg 2 (two) times daily.  06/18/17   Binnie Rail, MD  loperamide (IMODIUM) 2 MG capsule Take 1 capsule (2 mg total) by mouth 4 (four) times daily as needed for diarrhea or loose stools. 03/29/16   Shary Decamp, PA-C  meclizine (ANTIVERT) 25 MG tablet Take 1 tablet (25 mg total) by mouth 3 (three) times daily as needed for dizziness. 04/02/16   Binnie Rail, MD  medroxyPROGESTERone (DEPO-PROVERA) 150 MG/ML injection Inject 150 mg into the muscle every 3 (three) months.      [provider]  meloxicam (MOBIC) 15 MG tablet Take 1 tablet (15 mg total) by mouth daily. 06/08/16   Gardiner Barefoot, DPM  metFORMIN (GLUCOPHAGE) 1000 MG tablet 1000 mg with breakfast, 500 mg with lunch, 1000 mg with dinner 05/25/17   Binnie Rail, MD  metoprolol tartrate (LOPRESSOR) 25 MG tablet TAKE 1 TABLET BY MOUTH EVERY DAY 07/16/17   Binnie Rail, MD  ondansetron (ZOFRAN) 4 MG tablet TAKE 1 TABLET BY MOUTH EVERY 6 HOURS 02/06/17   Binnie Rail, MD    Allergies    Zolpidem tartrate  Review of Systems   Review of Systems  Constitutional: Negative for chills and fever.  Eyes: Negative for visual disturbance.  Respiratory: Negative for shortness of breath.   Cardiovascular: Negative for leg swelling.  Gastrointestinal: Negative for abdominal pain and vomiting.  Musculoskeletal: Positive for back pain. Negative for neck pain.  Neurological: Negative for dizziness, syncope, weakness, numbness and headaches.       Negative for incontinence or saddle anesthesia.  All other systems reviewed and are negative.   Physical Exam Updated Vital Signs BP (!) 145/99 (BP Location: Right Arm)   Pulse (!) 101   Temp 98.5 F (36.9 C) (Oral)   Resp 14   Ht 5\' 4"  (1.626 m)   Wt 75.8 kg   SpO2 96%   BMI 28.67 kg/m   Physical Exam Vitals and nursing note reviewed.  Constitutional:      General: She is not in acute distress.    Appearance: She is  well-developed.  HENT:     Head: Normocephalic and atraumatic. No raccoon eyes or Battle's sign.     Right Ear: No hemotympanum.     Left Ear: No hemotympanum.  Eyes:     General:        Right eye: No discharge.        Left eye: No discharge.  Conjunctiva/sclera: Conjunctivae normal.     Pupils: Pupils are equal, round, and reactive to light.  Neck:     Comments: No midline tenderness. Cardiovascular:     Rate and Rhythm: Normal rate and regular rhythm.     Heart sounds: No murmur.     Comments: 2 + symmetric PT pulses bilaterally. Pulmonary:     Effort: No respiratory distress.     Breath sounds: Normal breath sounds. No wheezing or rales.  Chest:     Chest wall: No tenderness.  Abdominal:     General: There is no distension.     Palpations: Abdomen is soft.     Tenderness: There is no abdominal tenderness. There is no guarding or rebound.  Musculoskeletal:     Cervical back: Normal range of motion and neck supple. No spinous process tenderness.     Comments: Upper extremities: Intact active range of motion.  No point/focal bony tenderness Back: Diffusely tender throughout thoracic and lumbar region including midline and bilateral paraspinal muscles.  No point/focal vertebral tenderness or palpable step-off. Lower extremities: Patient has a left knee brace and left ankle Ace wrap in place.  She has intact active range of motion.  No point/focal bony tenderness.  Skin:    General: Skin is warm and dry.     Findings: No rash.  Neurological:     Comments: Alert.  Clear speech.  CN II through XII grossly intact.  Sensation grossly intact bilateral upper and lower extremities.  5 out of 5 symmetric grip strength.  5 out of 5 strength with plantar dorsiflexion bilaterally.  Patient is ambulatory with antalgic gait.  Psychiatric:        Behavior: Behavior normal.     ED Results / Procedures / Treatments   Labs (all labs ordered are listed, but only abnormal results are  displayed) Labs Reviewed - No data to display  EKG None  Radiology No results found.  Procedures Procedures (including critical care time)  Medications Ordered in ED Medications  oxyCODONE-acetaminophen (PERCOCET/ROXICET) 5-325 MG per tablet 1 tablet (1 tablet Oral Given 06/27/19 1454)    ED Course  I have reviewed the triage vital signs and the nursing notes.  Pertinent labs & imaging results that were available during my care of the patient were reviewed by me and considered in my medical decision making (see chart for details).    MDM Rules/Calculators/A&P                     Patient presents to the emergency department status post a chemical fall with complaints of back pain.  She is nontoxic, resting comfortably, vitals without significant abnormality, BP somewhat elevated, doubt HTN emergency.  No signs of serious head/neck injury.  Do not feel that CT imaging of the head/neck is necessary per [place] head and C-spine rules. No chest/abdominal tenderness.  She has diffuse tenderness throughout the thoracic and lumbar region without point/focal vertebral tenderness or palpable step-off.  No neuro deficits. I have ordered thoracic & lumbar xrays for further evaluation as well as percocet for pain in the ED.   15:20: Patient care signed out to PA Henderley at change of shift pending xrays, if no acute concerning injury would discharge home with muscle relaxant & lidoderm patches to take in addition to her meloxicam that she hast at home.    Final Clinical Impression(s) / ED Diagnoses Final diagnoses:  Acute low back pain without sciatica, unspecified back pain laterality  Fall,  initial encounter    Rx / DC Orders ED Discharge Orders         Ordered    methocarbamol (ROBAXIN) 500 MG tablet  Every 8 hours PRN     06/27/19 1448    lidocaine (LIDODERM) 5 %  Every 24 hours     06/27/19 1449           Keldrick Pomplun, [place] R, PA-C 06/27/19 1524    Margarita Grizzle,  MD 06/28/19 [phone]

## 2019-06-27 NOTE — Discharge Instructions (Addendum)
You were seen in the emergency department today after a fall.  Your CT scan did show a compression fracture at T12.  We have placed you in a brace for comfort.  May take Tylenol and ibuprofen for pain.  You need to follow-up with neurosurgery outpatient in approximately 1 week.  Written you a work note.

## 2019-06-27 NOTE — ED Notes (Signed)
Pt given hot packs. 

## 2019-10-16 ENCOUNTER — Other Ambulatory Visit: Payer: Self-pay

## 2019-10-16 ENCOUNTER — Ambulatory Visit
Admission: EM | Admit: 2019-10-16 | Discharge: 2019-10-16 | Disposition: A | Discharge status: Home / Self Care | Payer: BLUE CROSS/BLUE SHIELD | Attending: Physician Assistant | Admitting: Physician Assistant

## 2019-10-16 DIAGNOSIS — R0789 Other chest pain: Secondary | ICD-10-CM | POA: Diagnosis not present

## 2019-10-16 NOTE — ED Provider Notes (Signed)
[facility]    CSN: [id number] Arrival date & time: 10/16/19  1302      History   Chief Complaint Chief Complaint  Patient presents with  . chest pressure    HPI Emily [name] is a 44 y.o. female.   44 year old female with history of DM, HLD, HTN comes in for central chest pressure that started after lunch around noon today.  Chest pressure resolved on own after approximately 1 hour.  Denies associated pain, shortness of breath, radiation of symptoms.  States this morning had some nausea without vomiting.  Nausea did not get worse during chest pressure.  Denies obvious exertional symptoms such as exertional fatigue, exertional chest pain.  States does have some dyspnea on exertion at baseline that has not worsened.  Denies personal history of heart disease.  Father with heart disease at approx 56 years old.     Past Medical History:  Diagnosis Date  . ANXIETY   . Anxiety 05/18/2008   Qualifier: Diagnosis of  By: Shawnie Pons MD, Kenney Houseman    . DEPRESSION   . Depression 07/20/2008   Qualifier: Diagnosis of  By: Shawnie Pons MD, Kenney Houseman    . DIABETES MELLITUS, TYPE II dx 04/2009  . GERD   . Hyperlipidemia 01/21/2017  . Hypertension   . Obesity, unspecified   . PANIC DISORDER     Patient Active Problem List   Diagnosis Date Noted  . Left knee pain 05/22/2017  . Osteoporosis 05/22/2017  . Fibromyalgia 05/22/2017  . Hyperlipidemia 01/21/2017  . Hypertension 07/19/2016  . Vitamin D deficiency 05/15/2016  . Vertigo 04/02/2016  . Diabetic neuropathy (HCC) 04/02/2016  . Premenopausal menorrhagia 05/04/2011  . Dysfunctional uterine bleeding 03/14/2011  . Lumbar radiculopathy 07/17/2010  . Diabetes (HCC) 05/20/2009  . Anxiety 05/18/2008    Past Surgical History:  Procedure Laterality Date  . NO PAST SURGERIES    . SPINE SURGERY  07/20/2010    OB History    Gravida  2   Para      Term      Preterm      AB  1   Living  1     SAB  1   TAB      Ectopic        Multiple      Live Births               Home Medications    Prior to Admission medications   Medication Sig Start Date End Date Taking? Authorizing Provider  albuterol (PROVENTIL HFA;VENTOLIN HFA) 108 (90 BASE) MCG/ACT inhaler Inhale 2 puffs into the lungs every 4 (four) hours as needed for wheezing.    [provider]  atorvastatin (LIPITOR) 20 MG tablet TAKE 1 TABLET BY MOUTH EVERY DAY 07/16/17   Pincus Sanes, MD  cholecalciferol (VITAMIN D) 1000 units tablet Take 1 tablet (1,000 Units total) by mouth daily. 01/22/17   Pincus Sanes, MD  DimenhyDRINATE (MOTION SICKNESS PO) Take 1 tablet by mouth every 6 (six) hours as needed (vertigo, diziness).    [provider]  Dulaglutide (TRULICITY) 1.5 MG/0.5ML SOPN Inject into the skin once a week.    [provider]  DULoxetine (CYMBALTA) 60 MG capsule Take 1 capsule (60 mg total) by mouth daily. 05/22/17   Pincus Sanes, MD  empagliflozin (JARDIANCE) 25 MG TABS tablet Take 25 mg by mouth daily.    [provider]  gabapentin (NEURONTIN) 100 MG capsule Take 100 mg  in evening in addition to 300 mg, increase to 200 mg in evening in addition to 300 mg if tolerated 04/02/16   Burns, Bobette Mo, MD  gabapentin (NEURONTIN) 300 MG capsule TAKE 1 CAPSULE BY MOUTH 3 TIMES DAILY Patient taking differently: 400 mg 2 (two) times daily.  06/18/17   Pincus Sanes, MD  lidocaine (LIDODERM) 5 % Place 1 patch onto the skin daily. Place 1 patch onto area of most significant pain once per day.  Remove & Discard patch within 12 hours. 06/27/19   Petrucelli, Samantha R, PA-C  loperamide (IMODIUM) 2 MG capsule Take 1 capsule (2 mg total) by mouth 4 (four) times daily as needed for diarrhea or loose stools. 03/29/16   Audry Pili, PA-C  meclizine (ANTIVERT) 25 MG tablet Take 1 tablet (25 mg total) by mouth 3 (three) times daily as needed for dizziness. 04/02/16   Pincus Sanes, MD  medroxyPROGESTERone (DEPO-PROVERA) 150 MG/ML injection  Inject 150 mg into the muscle every 3 (three) months.      [provider]  meloxicam (MOBIC) 15 MG tablet Take 1 tablet (15 mg total) by mouth daily. 06/08/16   Helane Gunther, DPM  metFORMIN (GLUCOPHAGE) 1000 MG tablet 1000 mg with breakfast, 500 mg with lunch, 1000 mg with dinner 05/25/17   Pincus Sanes, MD  methocarbamol (ROBAXIN) 500 MG tablet Take 1 tablet (500 mg total) by mouth every 8 (eight) hours as needed for muscle spasms. 06/27/19   Petrucelli, Samantha R, PA-C  metoprolol tartrate (LOPRESSOR) 25 MG tablet TAKE 1 TABLET BY MOUTH EVERY DAY 07/16/17   Pincus Sanes, MD  ondansetron (ZOFRAN) 4 MG tablet TAKE 1 TABLET BY MOUTH EVERY 6 HOURS 02/06/17   Pincus Sanes, MD    Family History Family History  Problem Relation Age of Onset  . Coronary artery disease Father        Had CABG, and angioplasty around age 76  . Diabetes Father   . Sarcoidosis Mother 69  . Asthma Mother   . Hyperlipidemia Mother   . Hypertension Mother   . Pulmonary fibrosis Sister 30  . Asthma Sister     Social History Social History   Tobacco Use  . Smoking status: Never Smoker  . Smokeless tobacco: Never Used  . Tobacco comment: Has 60 yt old son Dorma Russell who lives with her. (Autism & ADD) Work at [employer]  Substance Use Topics  . Alcohol use: No  . Drug use: No     Allergies   Zolpidem tartrate   Review of Systems Review of Systems  Reason unable to perform ROS: See HPI as above.     Physical Exam Triage Vital Signs ED Triage Vitals [10/16/19 1427]  Enc Vitals Group     BP 110/77     Pulse Rate 92     Resp 18     Temp 98.1 F (36.7 C)     Temp Source Oral     SpO2 97 %     Weight      Height      Head Circumference      Peak Flow      Pain Score 0     Pain Loc      Pain Edu?      Excl. in GC?    No data found.  Updated Vital Signs BP 110/77 (BP Location: Left Arm)   Pulse 92   Temp 98.1 F (36.7 C) (Oral)   Resp  18   SpO2 97%   Physical  Exam Constitutional:      General: She is not in acute distress.    Appearance: She is well-developed. She is not ill-appearing, toxic-appearing or diaphoretic.  HENT:     Head: Normocephalic and atraumatic.  Eyes:     Conjunctiva/sclera: Conjunctivae normal.     Pupils: Pupils are equal, round, and reactive to light.  Cardiovascular:     Rate and Rhythm: Normal rate and regular rhythm.  Pulmonary:     Effort: Pulmonary effort is normal. No respiratory distress.     Comments: LCTAB Abdominal:     General: Bowel sounds are normal.     Palpations: Abdomen is soft.     Tenderness: There is no abdominal tenderness. There is no right CVA tenderness, left CVA tenderness, guarding or rebound.  Musculoskeletal:     Cervical back: Normal range of motion and neck supple.  Skin:    General: Skin is warm and dry.  Neurological:     Mental Status: She is alert and oriented to person, place, and time.  Psychiatric:        Behavior: Behavior normal.        Judgment: Judgment normal.      UC Treatments / Results  Labs (all labs ordered are listed, but only abnormal results are displayed) Labs Reviewed - No data to display  EKG   Radiology No results found.  Procedures Procedures (including critical care time)  Medications Ordered in UC Medications - No data to display  Initial Impression / Assessment and Plan / UC Course  I have reviewed the triage vital signs and the nursing notes.  Pertinent labs & imaging results that were available during my care of the patient were reviewed by me and considered in my medical decision making (see chart for details).    Patient currently asymptomatic.  EKG NSR, 90bpm, no significant ST changes from prior EKG. No significant exertional symptoms. Has PCP appointment later today. Will have patient follow up for reevaluation. Return precautions given.  Final Clinical Impressions(s) / UC Diagnoses   Final diagnoses:  Chest pressure    ED  Prescriptions    None     PDMP not reviewed this encounter.   Belinda Fisher, PA-C 10/16/19 1505

## 2019-10-16 NOTE — Discharge Instructions (Signed)
EKG without any alarming changes compared to prior. Follow up with PCP for reevaluation/monitoring. If sudden return of symptoms that is worsening, having shortness of breath, weakness, sweating, go to the ED for further evaluation.

## 2019-10-16 NOTE — ED Triage Notes (Addendum)
Pt c/o center chest pressure since noon after lunch. Denies pain and SOB. States has had this multiple times and it would go away on its on. States wasn't feeling good this morning prior to this and was dizzy and nausea. States her cbg was 114.

## 2020-06-27 ENCOUNTER — Encounter: Payer: Self-pay | Admitting: Emergency Medicine

## 2020-06-27 ENCOUNTER — Ambulatory Visit
Admission: EM | Admit: 2020-06-27 | Discharge: 2020-06-27 | Disposition: A | Discharge status: Home / Self Care | Payer: BLUE CROSS/BLUE SHIELD | Attending: Emergency Medicine | Admitting: Emergency Medicine

## 2020-06-27 ENCOUNTER — Other Ambulatory Visit: Payer: Self-pay

## 2020-06-27 ENCOUNTER — Ambulatory Visit (INDEPENDENT_AMBULATORY_CARE_PROVIDER_SITE_OTHER): Payer: BLUE CROSS/BLUE SHIELD

## 2020-06-27 DIAGNOSIS — W19XXXA Unspecified fall, initial encounter: Secondary | ICD-10-CM | POA: Diagnosis not present

## 2020-06-27 DIAGNOSIS — S93509A Unspecified sprain of unspecified toe(s), initial encounter: Secondary | ICD-10-CM | POA: Diagnosis not present

## 2020-06-27 DIAGNOSIS — S63502A Unspecified sprain of left wrist, initial encounter: Secondary | ICD-10-CM | POA: Diagnosis not present

## 2020-06-27 DIAGNOSIS — M25532 Pain in left wrist: Secondary | ICD-10-CM | POA: Diagnosis not present

## 2020-06-27 DIAGNOSIS — R2241 Localized swelling, mass and lump, right lower limb: Secondary | ICD-10-CM

## 2020-06-27 MED ORDER — IBUPROFEN 800 MG PO TABS: 800.0000 mg | ORAL_TABLET | Freq: Three times a day (TID) | ORAL | 0 refills | Status: DC

## 2020-06-27 NOTE — ED Provider Notes (Signed)
[facility]    CSN: [id number] Arrival date & time: 06/27/20  1941      History   Chief Complaint Chief Complaint  Patient presents with  . Fall    HPI Emily [name] is a 45 y.o. female presenting today for evaluation of left wrist pain.  Reports that she was running yesterday and fell, attempted to catch herself with her left wrist.  And since has developed swelling and pain.  Has also noted swelling and bruising to her second and third toe on her right foot.  Denies hitting head or loss of consciousness.  Denies prior fractures.  Does report history of osteoporosis.  She has taken ibuprofen and Flexeril.  HPI  Past Medical History:  Diagnosis Date  . ANXIETY   . Anxiety 05/18/2008   Qualifier: Diagnosis of  By: Shawnie Pons MD, Kenney Houseman    . DEPRESSION   . Depression 07/20/2008   Qualifier: Diagnosis of  By: Shawnie Pons MD, Kenney Houseman    . DIABETES MELLITUS, TYPE II dx 04/2009  . GERD   . Hyperlipidemia 01/21/2017  . Hypertension   . Obesity, unspecified   . PANIC DISORDER     Patient Active Problem List   Diagnosis Date Noted  . Left knee pain 05/22/2017  . Osteoporosis 05/22/2017  . Fibromyalgia 05/22/2017  . Hyperlipidemia 01/21/2017  . Hypertension 07/19/2016  . Vitamin D deficiency 05/15/2016  . Vertigo 04/02/2016  . Diabetic neuropathy (HCC) 04/02/2016  . Premenopausal menorrhagia 05/04/2011  . Dysfunctional uterine bleeding 03/14/2011  . Lumbar radiculopathy 07/17/2010  . Diabetes (HCC) 05/20/2009  . Anxiety 05/18/2008    Past Surgical History:  Procedure Laterality Date  . NO PAST SURGERIES    . SPINE SURGERY  07/20/2010    OB History    Gravida  2   Para      Term      Preterm      AB  1   Living  1     SAB  1   IAB      Ectopic      Multiple      Live Births               Home Medications    Prior to Admission medications   Medication Sig Start Date End Date Taking? Authorizing Provider  albuterol (PROVENTIL  HFA;VENTOLIN HFA) 108 (90 BASE) MCG/ACT inhaler Inhale 2 puffs into the lungs every 4 (four) hours as needed for wheezing.   Yes [provider]  atorvastatin (LIPITOR) 20 MG tablet TAKE 1 TABLET BY MOUTH EVERY DAY 07/16/17  Yes Burns, Bobette Mo, MD  cholecalciferol (VITAMIN D) 1000 units tablet Take 1 tablet (1,000 Units total) by mouth daily. 01/22/17  Yes Burns, Bobette Mo, MD  DimenhyDRINATE (MOTION SICKNESS PO) Take 1 tablet by mouth every 6 (six) hours as needed (vertigo, diziness).   Yes [provider]  Dulaglutide (TRULICITY) 1.5 MG/0.5ML SOPN Inject into the skin once a week.   Yes [provider]  DULoxetine (CYMBALTA) 60 MG capsule Take 1 capsule (60 mg total) by mouth daily. 05/22/17  Yes Burns, Bobette Mo, MD  empagliflozin (JARDIANCE) 25 MG TABS tablet Take 25 mg by mouth daily.   Yes [provider]  gabapentin (NEURONTIN) 100 MG capsule Take 100 mg in evening in addition to 300 mg, increase to 200 mg in evening in addition to 300 mg if tolerated 04/02/16  Yes Burns, Bobette Mo, MD  gabapentin (NEURONTIN) 300 MG capsule TAKE  1 CAPSULE BY MOUTH 3 TIMES DAILY Patient taking differently: 400 mg 2 (two) times daily. 06/18/17  Yes Burns, Bobette Mo, MD  ibuprofen (ADVIL) 800 MG tablet Take 1 tablet (800 mg total) by mouth 3 (three) times daily. 06/27/20  Yes Jabari Swoveland C, PA-C  lidocaine (LIDODERM) 5 % Place 1 patch onto the skin daily. Place 1 patch onto area of most significant pain once per day.  Remove & Discard patch within 12 hours. 06/27/19  Yes Petrucelli, Samantha R, PA-C  loperamide (IMODIUM) 2 MG capsule Take 1 capsule (2 mg total) by mouth 4 (four) times daily as needed for diarrhea or loose stools. 03/29/16  Yes Audry Pili, PA-C  meclizine (ANTIVERT) 25 MG tablet Take 1 tablet (25 mg total) by mouth 3 (three) times daily as needed for dizziness. 04/02/16  Yes Burns, Bobette Mo, MD  medroxyPROGESTERone (DEPO-PROVERA) 150 MG/ML injection Inject 150 mg into the muscle  every 3 (three) months.   Yes [provider]  meloxicam (MOBIC) 15 MG tablet Take 1 tablet (15 mg total) by mouth daily. 06/08/16  Yes Helane Gunther, DPM  metFORMIN (GLUCOPHAGE) 1000 MG tablet 1000 mg with breakfast, 500 mg with lunch, 1000 mg with dinner 05/25/17  Yes Burns, Bobette Mo, MD  methocarbamol (ROBAXIN) 500 MG tablet Take 1 tablet (500 mg total) by mouth every 8 (eight) hours as needed for muscle spasms. 06/27/19  Yes Petrucelli, Samantha R, PA-C  metoprolol tartrate (LOPRESSOR) 25 MG tablet TAKE 1 TABLET BY MOUTH EVERY DAY 07/16/17  Yes Burns, Bobette Mo, MD  ondansetron (ZOFRAN) 4 MG tablet TAKE 1 TABLET BY MOUTH EVERY 6 HOURS 02/06/17  Yes Burns, Bobette Mo, MD    Family History Family History  Problem Relation Age of Onset  . Coronary artery disease Father        Had CABG, and angioplasty around age 77  . Diabetes Father   . Sarcoidosis Mother 47  . Asthma Mother   . Hyperlipidemia Mother   . Hypertension Mother   . Pulmonary fibrosis Sister 30  . Asthma Sister     Social History Social History   Tobacco Use  . Smoking status: Never Smoker  . Smokeless tobacco: Never Used  . Tobacco comment: Has 17 yt old son Dorma Russell who lives with her. (Autism & ADD) Work at [employer]  Substance Use Topics  . Alcohol use: No  . Drug use: No     Allergies   Methocarbamol and Zolpidem tartrate   Review of Systems Review of Systems  Constitutional: Negative for fatigue and fever.  Eyes: Negative for visual disturbance.  Respiratory: Negative for shortness of breath.   Cardiovascular: Negative for chest pain.  Gastrointestinal: Negative for abdominal pain, nausea and vomiting.  Musculoskeletal: Positive for arthralgias. Negative for joint swelling.  Skin: Negative for color change, rash and wound.  Neurological: Negative for dizziness, weakness, light-headedness and headaches.     Physical Exam Triage Vital Signs ED Triage Vitals  Enc Vitals Group     BP       Pulse      Resp      Temp      Temp src      SpO2      Weight      Height      Head Circumference      Peak Flow      Pain Score      Pain Loc      Pain Edu?  Excl. in GC?    No data found.  Updated Vital Signs BP 116/83 (BP Location: Left Arm)   Pulse 89   LMP 06/21/2020   SpO2 95%   Visual Acuity Right Eye Distance:   Left Eye Distance:   Bilateral Distance:    Right Eye Near:   Left Eye Near:    Bilateral Near:     Physical Exam Vitals and nursing note reviewed.  Constitutional:      Appearance: She is well-developed.     Comments: No acute distress  HENT:     Head: Normocephalic and atraumatic.     Nose: Nose normal.  Eyes:     Conjunctiva/sclera: Conjunctivae normal.  Cardiovascular:     Rate and Rhythm: Normal rate.  Pulmonary:     Effort: Pulmonary effort is normal. No respiratory distress.  Abdominal:     General: There is no distension.  Musculoskeletal:        General: Normal range of motion.     Cervical back: Neck supple.     Comments: Left wrist: Moderate swelling noted about distal radius and ulna, tenderness to palpation especially on ulnar aspect, extending slightly into carpals, does have some snuffbox tenderness, full active range of motion at MCP joints DIP and PIPs, radial pulse 2+  Left elbow with full range of motion, no tenderness  Right foot with mild bruising and swelling noted to the bases of the second and third toes, nontender throughout dorsum of foot, dorsalis pedis 2+  Skin:    General: Skin is warm and dry.  Neurological:     Mental Status: She is alert and oriented to person, place, and time.      UC Treatments / Results  Labs (all labs ordered are listed, but only abnormal results are displayed) Labs Reviewed - No data to display  EKG   Radiology DG Wrist Complete Left  Result Date: 06/27/2020 CLINICAL DATA:  Fall yesterday.  Reported toe swelling. EXAM: LEFT WRIST - COMPLETE 3+ VIEW COMPARISON:  None.  FINDINGS: The bones appear mildly demineralized. No evidence of acute fracture or dislocation. The carpal bone alignment is normal, and the joint spaces appear preserved. There is possible mild dorsal soft tissue swelling on the lateral view. IMPRESSION: Possible dorsal soft tissue swelling at the wrist. No acute osseous findings identified. Electronically Signed   By: Carey Bullocks M.D.   On: 06/27/2020 10:13   DG Foot Complete Right  Result Date: 06/27/2020 CLINICAL DATA:  Fall yesterday. Pain and swelling in the 2nd and 3rd toes. EXAM: RIGHT FOOT COMPLETE - 3+ VIEW COMPARISON:  Right foot radiographs 07/20/2004. More recent radiographs unavailable. FINDINGS: The bones appear mildly demineralized. There is no evidence of acute fracture or dislocation. There is mild posttraumatic deformity of the 1st proximal phalangeal neck related to an old fracture. The joint spaces appear adequately preserved, and no focal soft tissue abnormalities are identified. IMPRESSION: No acute osseous findings. Electronically Signed   By: Carey Bullocks M.D.   On: 06/27/2020 10:20    Procedures Procedures (including critical care time)  Medications Ordered in UC Medications - No data to display  Initial Impression / Assessment and Plan / UC Course  I have reviewed the triage vital signs and the nursing notes.  Pertinent labs & imaging results that were available during my care of the patient were reviewed by me and considered in my medical decision making (see chart for details).     X-rays negative for acute bony  abnormality.  Treating for wrist sprain placing in thumb spica, did have some snuffbox tenderness, recommend anti-inflammatories and monitoring gradual solution.  Follow-up if not improving or worsening.  Toe contusion/sprain-Buddy tape ice and elevate  Discussed strict return precautions. Patient verbalized understanding and is agreeable with plan.  Final Clinical Impressions(s) / UC Diagnoses    Final diagnoses:  Sprain of left wrist, initial encounter  Toe sprain, initial encounter     Discharge Instructions     No fracture Wear wrist brace, may continue to buddy tape toes Ice and elevate wrist/foot Use anti-inflammatories for pain/swelling. You may take up to 800 mg Ibuprofen every 8 hours with food. You may supplement Ibuprofen with Tylenol [phone] mg every 8 hours.  Follow up if not improving or worsening    ED Prescriptions    Medication Sig Dispense Auth. Provider   ibuprofen (ADVIL) 800 MG tablet Take 1 tablet (800 mg total) by mouth 3 (three) times daily. 21 tablet Nakoa Ganus, LanderHallie C, PA-C     PDMP not reviewed this encounter.   Tiney Zipper, [place] AlexandriaHallie C, PA-C 06/27/20 1031

## 2020-06-27 NOTE — Discharge Instructions (Signed)
No fracture Wear wrist brace, may continue to buddy tape toes Ice and elevate wrist/foot Use anti-inflammatories for pain/swelling. You may take up to 800 mg Ibuprofen every 8 hours with food. You may supplement Ibuprofen with Tylenol [phone] mg every 8 hours.  Follow up if not improving or worsening

## 2020-06-27 NOTE — ED Triage Notes (Signed)
Patient states that she was running yesterday and fell.  Patient is having left wrist swelling/pain, right 2nd toe on her foot is red and swollen.  No loss of consciousness.  Patient has taken Ibuprofen 800mg  and Flexeril.

## 2020-10-19 ENCOUNTER — Ambulatory Visit
Admission: EM | Admit: 2020-10-19 | Discharge: 2020-10-19 | Disposition: A | Discharge status: Home / Self Care | Payer: BLUE CROSS/BLUE SHIELD | Attending: Urgent Care | Admitting: Urgent Care

## 2020-10-19 ENCOUNTER — Encounter: Payer: Self-pay | Admitting: Emergency Medicine

## 2020-10-19 ENCOUNTER — Other Ambulatory Visit: Payer: Self-pay

## 2020-10-19 ENCOUNTER — Ambulatory Visit (INDEPENDENT_AMBULATORY_CARE_PROVIDER_SITE_OTHER): Payer: BLUE CROSS/BLUE SHIELD

## 2020-10-19 DIAGNOSIS — R0781 Pleurodynia: Secondary | ICD-10-CM

## 2020-10-19 DIAGNOSIS — Z794 Long term (current) use of insulin: Secondary | ICD-10-CM

## 2020-10-19 DIAGNOSIS — R109 Unspecified abdominal pain: Secondary | ICD-10-CM

## 2020-10-19 DIAGNOSIS — Z9889 Other specified postprocedural states: Secondary | ICD-10-CM

## 2020-10-19 DIAGNOSIS — E119 Type 2 diabetes mellitus without complications: Secondary | ICD-10-CM

## 2020-10-19 DIAGNOSIS — R739 Hyperglycemia, unspecified: Secondary | ICD-10-CM

## 2020-10-19 DIAGNOSIS — R824 Acetonuria: Secondary | ICD-10-CM

## 2020-10-19 LAB — POCT URINALYSIS DIP (MANUAL ENTRY)
Bilirubin, UA: NEGATIVE
Glucose, UA: 500 mg/dL — AB
Leukocytes, UA: NEGATIVE
Nitrite, UA: NEGATIVE
Protein Ur, POC: NEGATIVE mg/dL
Spec Grav, UA: 1.01 (ref 1.010–1.025)
Urobilinogen, UA: 0.2 E.U./dL
pH, UA: 5.5 (ref 5.0–8.0)

## 2020-10-19 LAB — POCT FASTING CBG KUC MANUAL ENTRY: POCT Glucose (KUC): 346 mg/dL — AB (ref 70–99)

## 2020-10-19 MED ORDER — TIZANIDINE HCL 4 MG PO TABS: 4.0000 mg | ORAL_TABLET | Freq: Every day | ORAL | 0 refills | Status: DC

## 2020-10-19 NOTE — Discharge Instructions (Addendum)
There is no sign of rib fracture, kidney stone from your labs today. You do have very high blood sugar and ketones in your urine. I am concerned that you may be developing diabetic ketoacidosis and that this may be the cause of your left sided body pain. Please take your insulin, hydrate well with plain water. Use Tylenol for your aches and pains, can use tizanidine as a muscle relaxant together with this. If you continue to worsen, please report to the emergency room.   For diabetes or elevated blood sugar, please make sure you are limiting and avoiding starchy, carbohydrate foods like pasta, breads, sweet breads, pastry, rice, potatoes, desserts. These foods can elevate your blood sugar. Also, limit and avoid drinks that contain a lot of sugar such as sodas, sweet teas, fruit juices.  Drinking plain water will be much more helpful, try 64 ounces of water daily.  It is okay to flavor your water naturally by cutting cucumber, lemon, mint or lime, placing it in a picture with water and drinking it over a period of 24-48 hours as long as it remains refrigerated.  For elevated blood pressure, make sure you are monitoring salt in your diet.  Do not eat restaurant foods and limit processed foods at home. I highly recommend you prepare and cook your own foods at home.  Processed foods include things like frozen meals, pre-seasoned meats and dinners, deli meats, canned foods as these foods contain a high amount of sodium/salt.  Make sure you are paying attention to sodium labels on foods you buy at the grocery store. Buy your spices separately such as garlic powder, onion powder, cumin, cayenne, parsley flakes so that you can avoid seasonings that contain salt. However, salt-free seasonings are available and can be used, an example is Mrs. Dash and includes a lot of different mixtures that do not contain salt.  Lastly, when cooking using oils that are healthier for you is important. This includes olive oil, avocado  oil, canola oil. We have discussed a lot of foods to avoid but below is a list of foods that can be very healthy to use in your diet whether it is for diabetes, cholesterol, high blood pressure, or in general healthy eating.  Salads - kale, spinach, cabbage, spring mix, arugula Fruits - avocadoes, berries (blueberries, raspberries, blackberries), apples, oranges, pomegranate, grapefruit, kiwi Vegetables - asparagus, cauliflower, broccoli, green beans, brussel sprouts, bell peppers, beets; stay away from or limit starchy vegetables like potatoes, carrots, peas Other general foods - kidney beans, egg whites, almonds, walnuts, sunflower seeds, pumpkin seeds, fat free yogurt, almond milk, flax seeds, quinoa, oats  Meat - It is better to eat lean meats and limit your red meat including pork to once a week.  Wild caught fish, chicken breast are good options as they tend to be leaner sources of good protein. Still be mindful of the sodium labels for the meats you buy.  DO NOT EAT ANY FOODS ON THIS LIST THAT YOU ARE ALLERGIC TO. For more specific needs, I highly recommend consulting a dietician or nutritionist but this can definitely be a good starting point.

## 2020-10-19 NOTE — ED Triage Notes (Signed)
Left flank pain beginning four days prior. Cannot remember what she was doing when the back pain started. Movement worsens. Denies fever, abdominal pain, hematuria. Also c/o right shin splint

## 2020-10-19 NOTE — ED Provider Notes (Signed)
[facility]   MRN: [id number] DOB: April 04, 1975  Subjective:   Emily [name] is a 45 y.o. female presenting for 4-day history of acute onset left lateral lower chest pain, rib pain, flank pain.  Reports that she suffered a fall in May and thinks this may have contributed.  She does have a history of compression fractures, back surgery, last episode more than a year ago.  Denies fever, shortness of breath, nausea, vomiting, abdominal pain, hematuria, dysuria, urinary frequency.  No history of renal stones.  However there is a family history of this.  She is a type II diabetic, not treated with insulin.  No current facility-administered medications for this encounter.  Current Outpatient Medications:    albuterol (PROVENTIL HFA;VENTOLIN HFA) 108 (90 BASE) MCG/ACT inhaler, Inhale 2 puffs into the lungs every 4 (four) hours as needed for wheezing., Disp: , Rfl:    atorvastatin (LIPITOR) 20 MG tablet, TAKE 1 TABLET BY MOUTH EVERY DAY, Disp: 90 tablet, Rfl: 1   cholecalciferol (VITAMIN D) 1000 units tablet, Take 1 tablet (1,000 Units total) by mouth daily., Disp: , Rfl:    DimenhyDRINATE (MOTION SICKNESS PO), Take 1 tablet by mouth every 6 (six) hours as needed (vertigo, diziness)., Disp: , Rfl:    Dulaglutide (TRULICITY) 1.5 MG/0.5ML SOPN, Inject into the skin once a week., Disp: , Rfl:    DULoxetine (CYMBALTA) 60 MG capsule, Take 1 capsule (60 mg total) by mouth daily., Disp: 90 capsule, Rfl: 3   empagliflozin (JARDIANCE) 25 MG TABS tablet, Take 25 mg by mouth daily., Disp: , Rfl:    gabapentin (NEURONTIN) 100 MG capsule, Take 100 mg in evening in addition to 300 mg, increase to 200 mg in evening in addition to 300 mg if tolerated, Disp: 60 capsule, Rfl: 3   gabapentin (NEURONTIN) 300 MG capsule, TAKE 1 CAPSULE BY MOUTH 3 TIMES DAILY (Patient taking differently: 400 mg 2 (two) times daily.), Disp: 90 capsule, Rfl: 0   ibuprofen (ADVIL) 800 MG tablet, Take 1 tablet (800 mg total) by  mouth 3 (three) times daily., Disp: 21 tablet, Rfl: 0   lidocaine (LIDODERM) 5 %, Place 1 patch onto the skin daily. Place 1 patch onto area of most significant pain once per day.  Remove & Discard patch within 12 hours., Disp: 30 patch, Rfl: 0   loperamide (IMODIUM) 2 MG capsule, Take 1 capsule (2 mg total) by mouth 4 (four) times daily as needed for diarrhea or loose stools., Disp: 12 capsule, Rfl: 0   meclizine (ANTIVERT) 25 MG tablet, Take 1 tablet (25 mg total) by mouth 3 (three) times daily as needed for dizziness., Disp: 30 tablet, Rfl: 5   medroxyPROGESTERone (DEPO-PROVERA) 150 MG/ML injection, Inject 150 mg into the muscle every 3 (three) months., Disp: , Rfl:    meloxicam (MOBIC) 15 MG tablet, Take 1 tablet (15 mg total) by mouth daily., Disp: 30 tablet, Rfl: 0   metFORMIN (GLUCOPHAGE) 1000 MG tablet, 1000 mg with breakfast, 500 mg with lunch, 1000 mg with dinner, Disp: 225 tablet, Rfl: 3   methocarbamol (ROBAXIN) 500 MG tablet, Take 1 tablet (500 mg total) by mouth every 8 (eight) hours as needed for muscle spasms., Disp: 15 tablet, Rfl: 0   metoprolol tartrate (LOPRESSOR) 25 MG tablet, TAKE 1 TABLET BY MOUTH EVERY DAY, Disp: 90 tablet, Rfl: 1   ondansetron (ZOFRAN) 4 MG tablet, TAKE 1 TABLET BY MOUTH EVERY 6 HOURS, Disp: 12 tablet, Rfl: 0   Allergies  Allergen Reactions  Methocarbamol Other (See Comments)    "causing sores inside mouth"   Zolpidem Tartrate     Hallucinations lasting almost 24 hrs    Past Medical History:  Diagnosis Date   ANXIETY    Anxiety 05/18/2008   Qualifier: Diagnosis of  By: Shawnie Pons MD, Tanya     DEPRESSION    Depression 07/20/2008   Qualifier: Diagnosis of  By: Shawnie Pons MD, Tanya     DIABETES MELLITUS, TYPE II dx 04/2009   GERD    Hyperlipidemia 01/21/2017   Hypertension    Obesity, unspecified    PANIC DISORDER      Past Surgical History:  Procedure Laterality Date   NO PAST SURGERIES     SPINE SURGERY  07/20/2010    Family History  Problem  Relation Age of Onset   Coronary artery disease Father        Had CABG, and angioplasty around age 90   Diabetes Father    Sarcoidosis Mother 20   Asthma Mother    Hyperlipidemia Mother    Hypertension Mother    Pulmonary fibrosis Sister 39   Asthma Sister     Social History   Tobacco Use   Smoking status: Never   Smokeless tobacco: Never   Tobacco comments:    Has 15 yt old son Dorma Russell who lives with her. (Autism & ADD) Work at [employer]  Substance Use Topics   Alcohol use: No   Drug use: No    ROS   Objective:   Vitals: BP 129/85 (BP Location: Left Arm)   Pulse 90   Temp (!) 97.4 F (36.3 C) (Oral)   Resp 18   SpO2 98%   Physical Exam Constitutional:      General: She is not in acute distress.    Appearance: Normal appearance. She is well-developed. She is not ill-appearing, toxic-appearing or diaphoretic.  HENT:     Head: Normocephalic and atraumatic.     Nose: Nose normal.     Mouth/Throat:     Mouth: Mucous membranes are moist.     Pharynx: Oropharynx is clear.  Eyes:     General: No scleral icterus.       Right eye: No discharge.        Left eye: No discharge.     Extraocular Movements: Extraocular movements intact.     Conjunctiva/sclera: Conjunctivae normal.     Pupils: Pupils are equal, round, and reactive to light.  Cardiovascular:     Rate and Rhythm: Normal rate and regular rhythm.     Pulses: Normal pulses.     Heart sounds: Normal heart sounds. No murmur heard.   No friction rub. No gallop.  Pulmonary:     Effort: Pulmonary effort is normal. No respiratory distress.     Breath sounds: Normal breath sounds. No stridor. No wheezing, rhonchi or rales.  Chest:     Chest wall: Tenderness (left lower lateral side extending posteriorly) present. No mass, lacerations, deformity, swelling, crepitus or edema. There is no dullness to percussion.    Abdominal:     General: Bowel sounds are normal. There is no distension.     Palpations:  Abdomen is soft. There is no mass.     Tenderness: There is no abdominal tenderness. There is no right CVA tenderness, left CVA tenderness, guarding or rebound.  Skin:    General: Skin is warm and dry.     Findings: No rash.  Neurological:     General: No  focal deficit present.     Mental Status: She is alert and oriented to person, place, and time.  Psychiatric:        Mood and Affect: Mood normal.        Behavior: Behavior normal.        Thought Content: Thought content normal.        Judgment: Judgment normal.    DG Ribs Unilateral W/Chest Left  Result Date: 10/19/2020 CLINICAL DATA:  Left rib pain EXAM: LEFT RIBS AND CHEST - 3+ VIEW COMPARISON:  Radiograph 06/13/2015, chest CT 06/13/2012 FINDINGS: There is no evidence of displaced rib fracture. Cardiomediastinal silhouette is within normal is. There is no focal airspace disease. There is no large pleural effusion or visible pneumothorax. IMPRESSION: No evidence of displaced rib fracture. Electronically Signed   By: Caprice Renshaw M.D.   On: 10/19/2020 09:14     Results for orders placed or performed during the hospital encounter of 10/19/20 (from the past 24 hour(s))  POCT urinalysis dipstick     Status: Abnormal   Collection Time: 10/19/20  8:34 AM  Result Value Ref Range   Color, UA yellow yellow   Clarity, UA clear clear   Glucose, UA =500 (A) negative mg/dL   Bilirubin, UA negative negative   Ketones, POC UA large (80) (A) negative mg/dL   Spec Grav, UA 6.812 7.517 - 1.025   Blood, UA trace-intact (A) negative   pH, UA 5.5 5.0 - 8.0   Protein Ur, POC negative negative mg/dL   Urobilinogen, UA 0.2 0.2 or 1.0 E.U./dL   Nitrite, UA Negative Negative   Leukocytes, UA Negative Negative  POCT CBG (manual entry)     Status: Abnormal   Collection Time: 10/19/20  9:16 AM  Result Value Ref Range   POCT Glucose ([facility]) 346 (A) 70 - 99 mg/dL    Assessment and Plan :   PDMP not reviewed this encounter.  1. Left flank pain   2.  Rib pain on left side   3. History of back surgery   4. Type 2 diabetes mellitus treated with insulin (HCC)   5. Urine ketones   6. Hyperglycemia     Will manage for musculoskeletal pain but also discussed the possibility that she may be developing diabetic ketoacidosis given that she is been noncompliant with her medications.  Recommended that she start taking Tylenol, tizanidine for her musculoskeletal pain.  However, emphasized need to restart her diabetic medications including her insulin.  At this time I do not see signs of an acute emergent problem.  I did request that she maintain strict ER precautions. Counseled patient on potential for adverse effects with medications prescribed/recommended today, ER and return-to-clinic precautions discussed, patient verbalized understanding.    Wallis Bamberg, PA-C 10/19/20 1008

## 2021-04-20 ENCOUNTER — Ambulatory Visit
Admission: EM | Admit: 2021-04-20 | Discharge: 2021-04-20 | Disposition: A | Discharge status: Home / Self Care | Payer: BLUE CROSS/BLUE SHIELD

## 2021-04-20 ENCOUNTER — Encounter: Payer: Self-pay | Admitting: Emergency Medicine

## 2021-04-20 ENCOUNTER — Other Ambulatory Visit: Payer: Self-pay

## 2021-04-20 ENCOUNTER — Emergency Department (HOSPITAL_BASED_OUTPATIENT_CLINIC_OR_DEPARTMENT_OTHER)
Admission: EM | Admit: 2021-04-20 | Discharge: 2021-04-20 | Disposition: A | Discharge status: Home / Self Care | Payer: Self-pay | Attending: Emergency Medicine | Admitting: Emergency Medicine

## 2021-04-20 ENCOUNTER — Encounter (HOSPITAL_BASED_OUTPATIENT_CLINIC_OR_DEPARTMENT_OTHER): Payer: Self-pay

## 2021-04-20 DIAGNOSIS — Z79899 Other long term (current) drug therapy: Secondary | ICD-10-CM | POA: Insufficient documentation

## 2021-04-20 DIAGNOSIS — H5712 Ocular pain, left eye: Secondary | ICD-10-CM

## 2021-04-20 MED ORDER — TETRACAINE HCL 0.5 % OP SOLN
1.0000 [drp] | Freq: Once | OPHTHALMIC | Status: AC
  Administered 2021-04-20: 2 [drp] via OPHTHALMIC
  Filled 2021-04-20: qty 4

## 2021-04-20 NOTE — Discharge Instructions (Signed)
Go directly to Dr. Laruth Bouchard office from here

## 2021-04-20 NOTE — ED Triage Notes (Signed)
Pt came in POV with c/o of eye pain/pressure wi th hx of bilateral cataracts that she is scheduled for surg in [place]. Pt is complaining of photophobic pain and does not want to open eyes to any source of light.

## 2021-04-20 NOTE — ED Triage Notes (Signed)
Left eye pressure starting yesterday, worsening into today. Very photophobic. "Pressure and pain shooting from left side of left eye." Started after waking up from a nap, continued into today. Continues to talk about her bilateral cataracts that she needs surgery for. Denies being aware of any hx of glaucoma, fever, injury to eye, changes to visual fields, nausea, LOC

## 2021-04-20 NOTE — ED Provider Notes (Signed)
Patient here today with sister for evaluation of severe eye pain and pressure that started yesterday and has worsened today. She has had severe photophobia. She has not had any nausea or vomiting but also states she has not eaten today. She denies any injury. She has decreased visual acuity at baseline. I recommended further evaluation in the ED to rule out narrow angle glaucoma, etc. Patient is agreeable and sister will transport her via POV.    Tomi Bamberger, PA-C 04/20/21 [phone]

## 2021-04-20 NOTE — ED Provider Notes (Signed)
[facility] EMERGENCY DEPT Provider Note   CSN: [id number] Arrival date & time: 04/20/21  1031     History  No chief complaint on file.   Emily [name] is a 46 y.o. female.  45 year old female presents with eye pain which began when she woke up.  Eye pain is on the left side and associated photophobia.  Denies any foreign body sensation.  Has noted a red eye.  No severe headaches appreciated.  Patient notes discomfort in her left eye when light is exposed to her right eye.  No drainage or fevers.  Went to urgent care and was sent here for further management.  Denies any prior history of glaucoma.  Is scheduled for cataract surgery next month      Home Medications Prior to Admission medications   Medication Sig Start Date End Date Taking? Authorizing Provider  albuterol (PROVENTIL HFA;VENTOLIN HFA) 108 (90 BASE) MCG/ACT inhaler Inhale 2 puffs into the lungs every 4 (four) hours as needed for wheezing.    [provider]  atorvastatin (LIPITOR) 20 MG tablet TAKE 1 TABLET BY MOUTH EVERY DAY 07/16/17   Pincus Sanes, MD  cholecalciferol (VITAMIN D) 1000 units tablet Take 1 tablet (1,000 Units total) by mouth daily. 01/22/17   Pincus Sanes, MD  DimenhyDRINATE (MOTION SICKNESS PO) Take 1 tablet by mouth every 6 (six) hours as needed (vertigo, diziness).    [provider]  Dulaglutide (TRULICITY) 1.5 MG/0.5ML SOPN Inject into the skin once a week.    [provider]  DULoxetine (CYMBALTA) 60 MG capsule Take 1 capsule (60 mg total) by mouth daily. 05/22/17   Pincus Sanes, MD  empagliflozin (JARDIANCE) 25 MG TABS tablet Take 25 mg by mouth daily.    [provider]  gabapentin (NEURONTIN) 100 MG capsule Take 100 mg in evening in addition to 300 mg, increase to 200 mg in evening in addition to 300 mg if tolerated 04/02/16   Burns, Bobette Mo, MD  gabapentin (NEURONTIN) 300 MG capsule TAKE 1 CAPSULE BY MOUTH 3 TIMES DAILY Patient taking  differently: 400 mg 2 (two) times daily. 06/18/17   Pincus Sanes, MD  ibuprofen (ADVIL) 800 MG tablet Take 1 tablet (800 mg total) by mouth 3 (three) times daily. 06/27/20   Wieters, Hallie C, PA-C  lidocaine (LIDODERM) 5 % Place 1 patch onto the skin daily. Place 1 patch onto area of most significant pain once per day.  Remove & Discard patch within 12 hours. 06/27/19   Petrucelli, Samantha R, PA-C  loperamide (IMODIUM) 2 MG capsule Take 1 capsule (2 mg total) by mouth 4 (four) times daily as needed for diarrhea or loose stools. 03/29/16   Audry Pili, PA-C  meclizine (ANTIVERT) 25 MG tablet Take 1 tablet (25 mg total) by mouth 3 (three) times daily as needed for dizziness. 04/02/16   Pincus Sanes, MD  medroxyPROGESTERone (DEPO-PROVERA) 150 MG/ML injection Inject 150 mg into the muscle every 3 (three) months.    [provider]  meloxicam (MOBIC) 15 MG tablet Take 1 tablet (15 mg total) by mouth daily. 06/08/16   Helane Gunther, DPM  metFORMIN (GLUCOPHAGE) 1000 MG tablet 1000 mg with breakfast, 500 mg with lunch, 1000 mg with dinner 05/25/17   Pincus Sanes, MD  methocarbamol (ROBAXIN) 500 MG tablet Take 1 tablet (500 mg total) by mouth every 8 (eight) hours as needed for muscle spasms. 06/27/19   Petrucelli, Samantha R, PA-C  metoprolol tartrate (LOPRESSOR) 25 MG  tablet TAKE 1 TABLET BY MOUTH EVERY DAY 07/16/17   Pincus Sanes, MD  ondansetron (ZOFRAN) 4 MG tablet TAKE 1 TABLET BY MOUTH EVERY 6 HOURS 02/06/17   Burns, Bobette Mo, MD  tiZANidine (ZANAFLEX) 4 MG tablet Take 1 tablet (4 mg total) by mouth at bedtime. 10/19/20   Wallis Bamberg, PA-C      Allergies    Methocarbamol and Zolpidem tartrate    Review of Systems   Review of Systems  All other systems reviewed and are negative.  Physical Exam Updated Vital Signs BP 123/84 (BP Location: Right Arm)    Pulse (!) 105    Temp 98.5 F (36.9 C) (Oral)    Resp 16    Ht 1.626 m (5\' 4" )    Wt 76.7 kg    SpO2 100%    BMI 29.01 kg/m  Physical  Exam Vitals and nursing note reviewed.  Constitutional:      General: She is not in acute distress.    Appearance: Normal appearance. She is well-developed. She is not toxic-appearing.  HENT:     Head: Normocephalic and atraumatic.  Eyes:     General: Lids are normal. Lids are everted, no foreign bodies appreciated. Vision grossly intact.        Left eye: No discharge.     Intraocular pressure: Left eye pressure is 28 mmHg. Measurements were taken using an automated tonometer.    Extraocular Movements:     Right eye: Normal extraocular motion.     Left eye: Normal extraocular motion.     Conjunctiva/sclera:     Left eye: Left conjunctiva is injected. No chemosis or exudate.    Pupils: Pupils are equal, round, and reactive to light.  Neck:     Thyroid: No thyroid mass.     Trachea: No tracheal deviation.  Cardiovascular:     Rate and Rhythm: Normal rate and regular rhythm.     Heart sounds: Normal heart sounds. No murmur heard.   No gallop.  Pulmonary:     Effort: Pulmonary effort is normal. No respiratory distress.     Breath sounds: Normal breath sounds. No stridor. No decreased breath sounds, wheezing, rhonchi or rales.  Abdominal:     General: Bowel sounds are normal. There is no distension.     Palpations: Abdomen is soft.     Tenderness: There is no abdominal tenderness. There is no rebound.  Musculoskeletal:        General: No tenderness. Normal range of motion.     Cervical back: Normal range of motion and neck supple.  Skin:    General: Skin is warm and dry.     Findings: No abrasion or rash.  Neurological:     Mental Status: She is alert and oriented to person, place, and time.     GCS: GCS eye subscore is 4. GCS verbal subscore is 5. GCS motor subscore is 6.     Cranial Nerves: No cranial nerve deficit.     Sensory: No sensory deficit.  Psychiatric:        Speech: Speech normal.        Behavior: Behavior normal.    ED Results / Procedures / Treatments    Labs (all labs ordered are listed, but only abnormal results are displayed) Labs Reviewed - No data to display  EKG None  Radiology No results found.  Procedures Procedures    Medications Ordered in ED Medications  tetracaine (PONTOCAINE) 0.5 % ophthalmic solution 1-2  drop (2 drops Both Eyes Given 04/20/21 1127)    ED Course/ Medical Decision Making/ A&P                           Medical Decision Making Risk Prescription drug management.   Patient seen for concern for possible acute angle-closure glaucoma.  Patient is intraocular pressure is 28.  She has no headache.  Considered glaucoma but feel that this is likely iritis.  Spoke with Dr. Alden Hipp from ophthalmology who will see patient today in the office when she leaves here        Final Clinical Impression(s) / ED Diagnoses Final diagnoses:  None    Rx / DC Orders ED Discharge Orders     None         Lorre Nick, MD 04/20/21 1154

## 2021-05-10 ENCOUNTER — Emergency Department (HOSPITAL_BASED_OUTPATIENT_CLINIC_OR_DEPARTMENT_OTHER): Payer: Self-pay

## 2021-05-10 ENCOUNTER — Encounter (HOSPITAL_BASED_OUTPATIENT_CLINIC_OR_DEPARTMENT_OTHER): Payer: Self-pay | Admitting: Emergency Medicine

## 2021-05-10 ENCOUNTER — Emergency Department (HOSPITAL_BASED_OUTPATIENT_CLINIC_OR_DEPARTMENT_OTHER)
Admission: EM | Admit: 2021-05-10 | Discharge: 2021-05-10 | Disposition: A | Discharge status: Home / Self Care | Payer: Self-pay | Attending: Emergency Medicine | Admitting: Emergency Medicine

## 2021-05-10 ENCOUNTER — Other Ambulatory Visit: Payer: Self-pay

## 2021-05-10 DIAGNOSIS — Z79899 Other long term (current) drug therapy: Secondary | ICD-10-CM | POA: Insufficient documentation

## 2021-05-10 DIAGNOSIS — M7989 Other specified soft tissue disorders: Secondary | ICD-10-CM | POA: Insufficient documentation

## 2021-05-10 DIAGNOSIS — I8002 Phlebitis and thrombophlebitis of superficial vessels of left lower extremity: Secondary | ICD-10-CM

## 2021-05-10 DIAGNOSIS — I10 Essential (primary) hypertension: Secondary | ICD-10-CM

## 2021-05-10 MED ORDER — IBUPROFEN 600 MG PO TABS: 600.0000 mg | ORAL_TABLET | Freq: Four times a day (QID) | ORAL | 0 refills | Status: AC | PRN

## 2021-05-10 MED ORDER — IBUPROFEN 400 MG PO TABS
400.0000 mg | ORAL_TABLET | Freq: Once | ORAL | Status: AC
  Administered 2021-05-10: 400 mg via ORAL
  Filled 2021-05-10: qty 1

## 2021-05-10 NOTE — ED Triage Notes (Signed)
Patient arrives POV c/o left lower swelling and tenderness for the past few days. Patient states she did not notice until her family pointed it out. Reports being on her feet all day for work and has neuropathy. Denies any injury.  ?

## 2021-05-10 NOTE — Discharge Instructions (Addendum)
It was our pleasure to provide your ER care today - we hope that you feel better. ? ?Warm compresses to sore area. Elevate foot. Take ibuprofen as need. May use compression stockings as need. ? ?Follow up with primary care doctor for recheck in one week - call office tomorrow to arrange appointment. Also have blood pressure rechecked then, as it is high today.  ? ?Return to ER if worse, new symptoms, fevers, increased swelling of leg, chest pain, trouble breathing, or other concern.  ?

## 2021-05-10 NOTE — ED Provider Notes (Signed)
?[place] EMERGENCY DEPT ?Provider Note ? ? ?CSN: [id number] ?Arrival date & time: 05/10/21  1614 ? ?  ? ?History ? ?Chief Complaint  ?Patient presents with  ? Foot Swelling  ? ? ?Emily [name] is a 46 y.o. female. ? ?Patient c/o left lower leg swelling for the past few days. Symptoms acute onset, moderate, persistent. Has sore area to medial aspect of left lower leg. Denies trauma or strain to leg or ankle. No hx same symptoms. No fever or chills. No recent trauma, travel, immobility or surgery. No chest pain or discomfort. No sob or unusual doe. No hx dvt or pe.  ? ?The history is provided by the patient, a relative and medical records.  ? ?  ? ?Home Medications ?Prior to Admission medications   ?Medication Sig Start Date End Date Taking? Authorizing Provider  ?albuterol (PROVENTIL HFA;VENTOLIN HFA) 108 (90 BASE) MCG/ACT inhaler Inhale 2 puffs into the lungs every 4 (four) hours as needed for wheezing.    [provider]  ?atorvastatin (LIPITOR) 20 MG tablet TAKE 1 TABLET BY MOUTH EVERY DAY 07/16/17   Binnie Rail, MD  ?cholecalciferol (VITAMIN D) 1000 units tablet Take 1 tablet (1,000 Units total) by mouth daily. 01/22/17   Binnie Rail, MD  ?DimenhyDRINATE (MOTION SICKNESS PO) Take 1 tablet by mouth every 6 (six) hours as needed (vertigo, diziness).    [provider]  ?Dulaglutide (TRULICITY) 1.5 0000000 SOPN Inject into the skin once a week.    [provider]  ?DULoxetine (CYMBALTA) 60 MG capsule Take 1 capsule (60 mg total) by mouth daily. 05/22/17   Binnie Rail, MD  ?empagliflozin (JARDIANCE) 25 MG TABS tablet Take 25 mg by mouth daily.    [provider]  ?gabapentin (NEURONTIN) 100 MG capsule Take 100 mg in evening in addition to 300 mg, increase to 200 mg in evening in addition to 300 mg if tolerated 04/02/16   Binnie Rail, MD  ?gabapentin (NEURONTIN) 300 MG capsule TAKE 1 CAPSULE BY MOUTH 3 TIMES DAILY ?Patient taking differently: 400 mg 2  (two) times daily. 06/18/17   Binnie Rail, MD  ?ibuprofen (ADVIL) 800 MG tablet Take 1 tablet (800 mg total) by mouth 3 (three) times daily. 06/27/20   Wieters, Hallie C, PA-C  ?lidocaine (LIDODERM) 5 % Place 1 patch onto the skin daily. Place 1 patch onto area of most significant pain once per day.  Remove & Discard patch within 12 hours. 06/27/19   Petrucelli, Samantha R, PA-C  ?loperamide (IMODIUM) 2 MG capsule Take 1 capsule (2 mg total) by mouth 4 (four) times daily as needed for diarrhea or loose stools. 03/29/16   Shary Decamp, PA-C  ?meclizine (ANTIVERT) 25 MG tablet Take 1 tablet (25 mg total) by mouth 3 (three) times daily as needed for dizziness. 04/02/16   Binnie Rail, MD  ?medroxyPROGESTERone (DEPO-PROVERA) 150 MG/ML injection Inject 150 mg into the muscle every 3 (three) months.    [provider]  ?meloxicam (MOBIC) 15 MG tablet Take 1 tablet (15 mg total) by mouth daily. 06/08/16   Gardiner Barefoot, DPM  ?metFORMIN (GLUCOPHAGE) 1000 MG tablet 1000 mg with breakfast, 500 mg with lunch, 1000 mg with dinner 05/25/17   Binnie Rail, MD  ?methocarbamol (ROBAXIN) 500 MG tablet Take 1 tablet (500 mg total) by mouth every 8 (eight) hours as needed for muscle spasms. 06/27/19   Petrucelli, Samantha R, PA-C  ?metoprolol tartrate (LOPRESSOR) 25 MG tablet TAKE 1 TABLET  BY MOUTH EVERY DAY 07/16/17   Binnie Rail, MD  ?ondansetron (ZOFRAN) 4 MG tablet TAKE 1 TABLET BY MOUTH EVERY 6 HOURS 02/06/17   Burns, Claudina Lick, MD  ?tiZANidine (ZANAFLEX) 4 MG tablet Take 1 tablet (4 mg total) by mouth at bedtime. 10/19/20   Jaynee Eagles, PA-C  ?   ? ?Allergies    ?Methocarbamol and Zolpidem tartrate   ? ?Review of Systems   ?Review of Systems  ?Constitutional:  Negative for fever.  ?Respiratory:  Negative for shortness of breath.   ?Cardiovascular:  Positive for leg swelling. Negative for chest pain.  ?Skin:  Negative for rash and wound.  ?Neurological:  Negative for weakness and numbness.  ? ?Physical Exam ?Updated Vital  Signs ?BP (!) 147/101 (BP Location: Left Arm)   Pulse (!) 102   Temp 98 ?F (36.7 ?C) (Oral)   Resp 16   Ht 1.626 m (5\' 4" )   Wt 76.2 kg   SpO2 100%   BMI 28.84 kg/m?  ?Physical Exam ?Vitals and nursing note reviewed.  ?Constitutional:   ?   Appearance: Normal appearance. She is well-developed.  ?HENT:  ?   Head: Atraumatic.  ?   Nose: Nose normal.  ?   Mouth/Throat:  ?   Mouth: Mucous membranes are moist.  ?Eyes:  ?   General: No scleral icterus. ?   Conjunctiva/sclera: Conjunctivae normal.  ?Neck:  ?   Trachea: No tracheal deviation.  ?Cardiovascular:  ?   Rate and Rhythm: Normal rate and regular rhythm.  ?   Pulses: Normal pulses.  ?   Heart sounds: Normal heart sounds. No murmur heard. ?  No friction rub. No gallop.  ?Pulmonary:  ?   Effort: Pulmonary effort is normal. No respiratory distress.  ?   Breath sounds: Normal breath sounds.  ?Abdominal:  ?   General: There is no distension.  ?   Tenderness: There is no abdominal tenderness.  ?Genitourinary: ?   Comments: No cva tenderness.  ?Musculoskeletal:  ?   Cervical back: Neck supple. No muscular tenderness.  ?   Comments: Mild left foot, ankle and distal lower leg swelling. No calf or thigh swelling.  Pt with area of tenderness medial aspect left lower leg just above ankle ~ 1 cm by 3 cm - no palp cord. Distal pulses palp. No focal bony tenderness. Skin warm, dry, no rash or skin lesions. No cellulitis.   ?Skin: ?   General: Skin is warm and dry.  ?   Findings: No rash.  ?Neurological:  ?   Mental Status: She is alert.  ?   Comments: Alert, speech normal. Left foot neurovascularly intact.   ?Psychiatric:     ?   Mood and Affect: Mood normal.  ? ? ?ED Results / Procedures / Treatments   ?Labs ?(all labs ordered are listed, but only abnormal results are displayed) ?Labs Reviewed - No data to display ? ?EKG ?None ? ?Radiology ?US Venous Img Lower  Left (DVT Study) ? ?Result Date: 05/10/2021 ?CLINICAL DATA:  Swelling and pain of left lower extremity EXAM: LEFT  LOWER EXTREMITY VENOUS DOPPLER ULTRASOUND TECHNIQUE: Gray-scale sonography with compression, as well as color and duplex ultrasound, were performed to evaluate the deep venous system(s) from the level of the common femoral vein through the popliteal and proximal calf veins. COMPARISON:  None. FINDINGS: VENOUS Normal compressibility of the common femoral, superficial femoral, and popliteal veins, as well as the visualized calf veins. Visualized portions of profunda femoral  vein and great saphenous vein unremarkable. No filling defects to suggest DVT on grayscale or color Doppler imaging. Doppler waveforms show normal direction of venous flow, normal respiratory plasticity and response to augmentation within the deep venous structures. Limited views of the contralateral common femoral vein are unremarkable. Thrombus noted in the left lesser saphenous vein. OTHER None. Limitations: none IMPRESSION: 1. No left lower extremity DVT. 2. Superficial thrombosis of the left greater saphenous vein. Electronically Signed   By: Miachel Roux M.D.   On: 05/10/2021 18:39   ? ?Procedures ?Procedures  ? ? ?Medications Ordered in ED ?Medications - No data to display ? ?ED Course/ Medical Decision Making/ A&P ?  ?                        ?Medical Decision Making ?Problems Addressed: ?Essential hypertension: chronic illness or injury with exacerbation, progression, or side effects of treatment ?Thrombophlebitis of superficial veins of left lower extremity: acute illness or injury ? ?Amount and/or Complexity of Data Reviewed ?Independent Historian:  ?   Details: family - additional hx ?External Data Reviewed: notes. ?Radiology: ordered and independent interpretation performed. Decision-making details documented in ED Course. ? ?Risk ?OTC drugs. ?Prescription drug management. ? ? ?Imaging ordered. ? ?Reviewed nursing notes and prior charts for additional history. External reports reviewed. Additional hx from family member. ? ?U/s  reviewed/interpreted by me - no dvt. C/w superficial thrombophlebitis.  ? ?Exam and u/s superficial thrombophlebitis.  ? ?Ibuprofen po. Warm compresses/heat.  ? ?Pt appears stable for d/c. ? ?Rec pcp f/u 1 week.  ? ?Return

## 2021-05-22 IMAGING — CT CT T SPINE W/O CM
3 of 6 series · 10 of 33 positions shown, 11 images · non-contrast
Comparison: Radiographs from 06/27/2019

CLINICAL DATA: Fall, back pain. T12 compression fracture.

EXAM:
CT THORACIC SPINE WITHOUT CONTRAST
TECHNIQUE: Multidetector CT images of the thoracic were obtained using the
standard protocol without intravenous contrast.

[Series 6: cor · coronal · 0.39mm/px · 1 of 66 slices shown]
[im 33/66  bone]
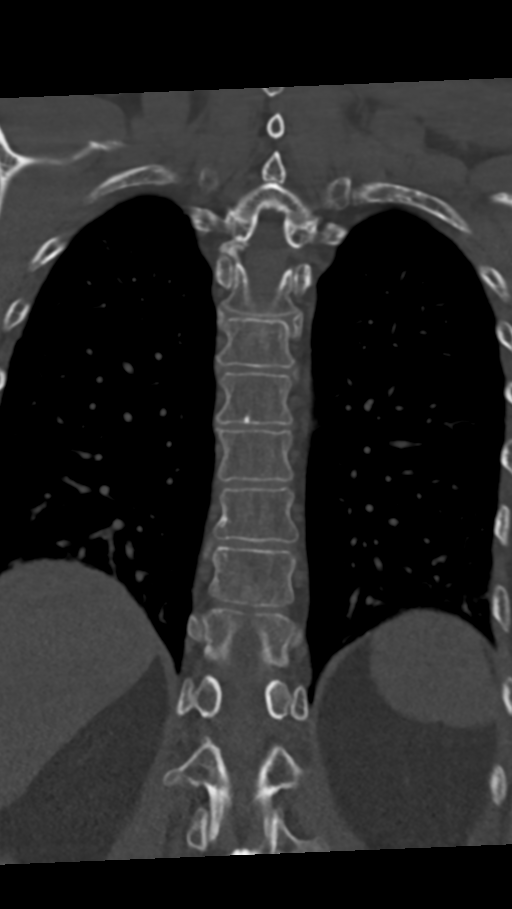

[Series 7: sag · sagittal · 0.26mm/px · 5 of 81 slices shown]
[im 14/81  bone]
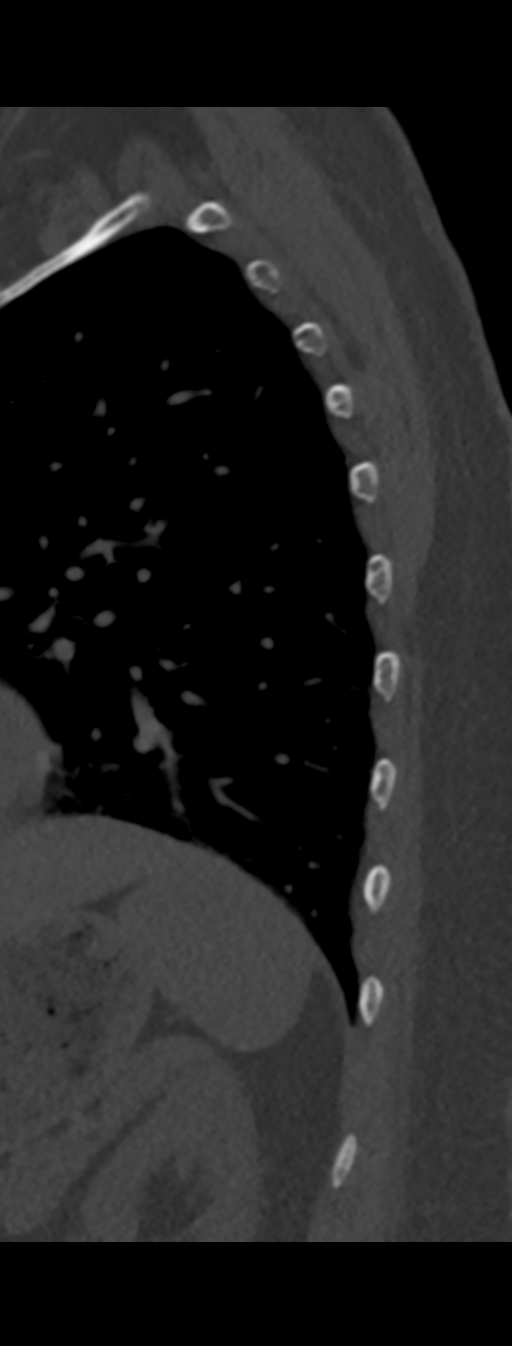
[im 27/81  bone]
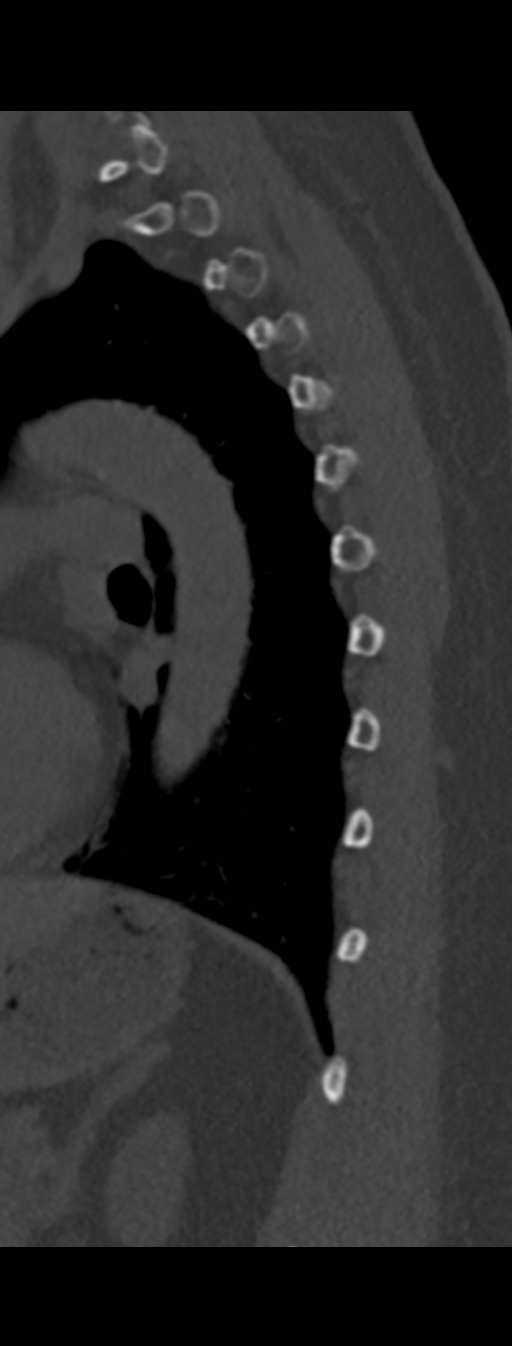
[im 41/81  bone]
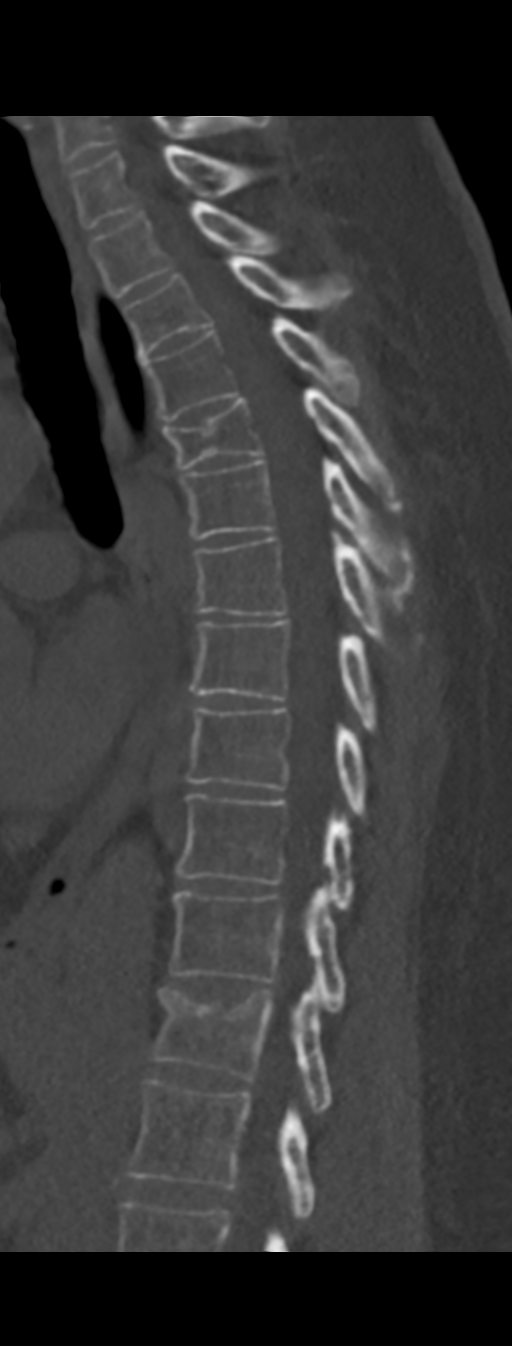
[im 54/81  bone]
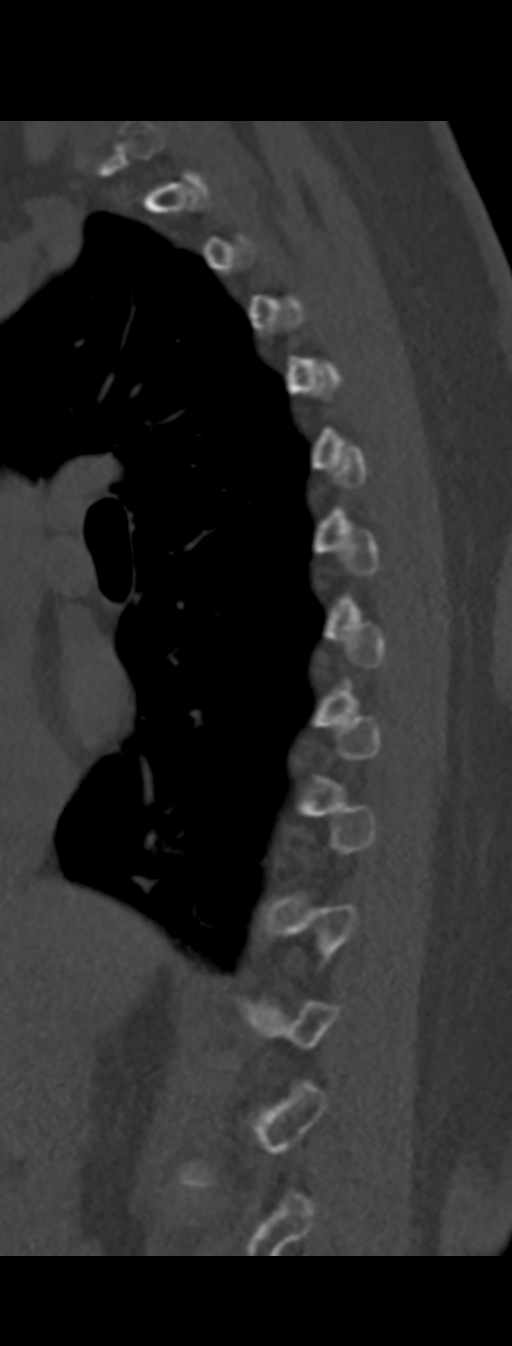
[im 67/81  bone]
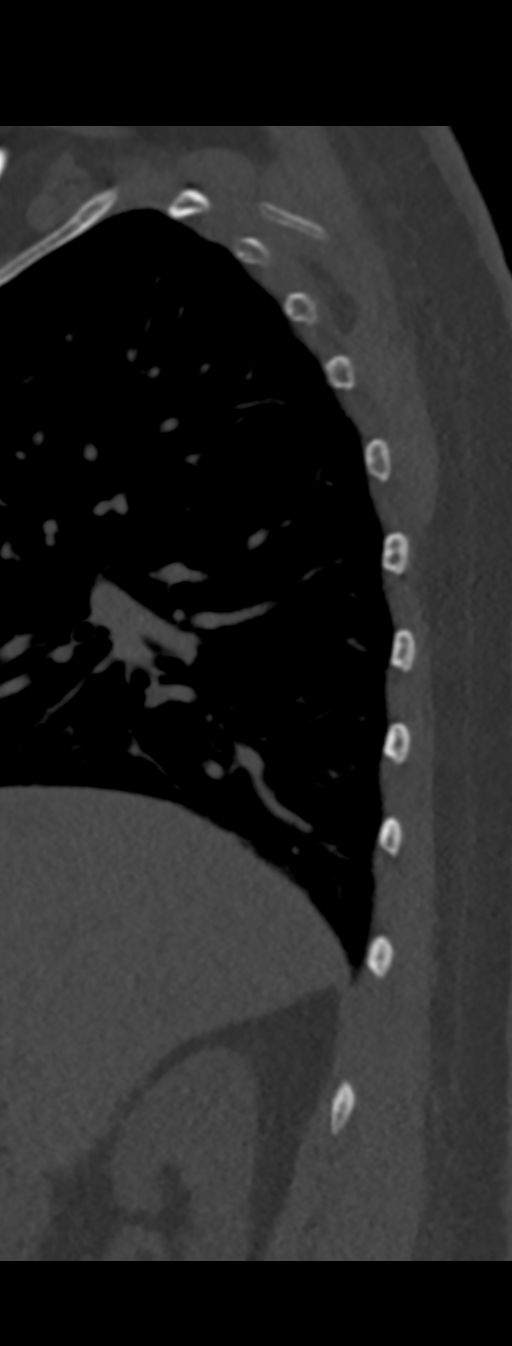

[Series 8: thoracic spine st axial · axial · 0.36mm/px · z∈[-364,-164]mm · 4 of 150 slices shown, 5 images]
[im 25/150  soft-tissue]
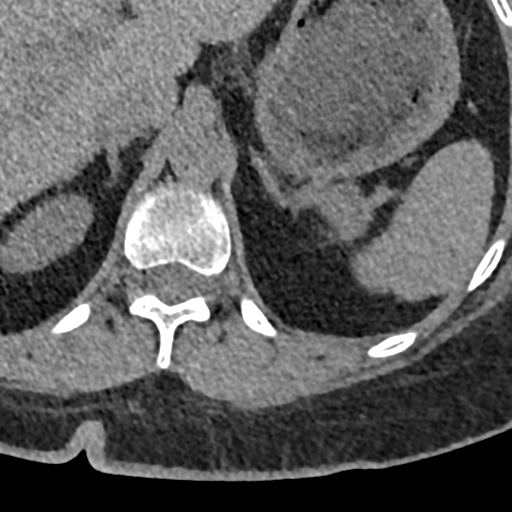
[im 25/150  bone]
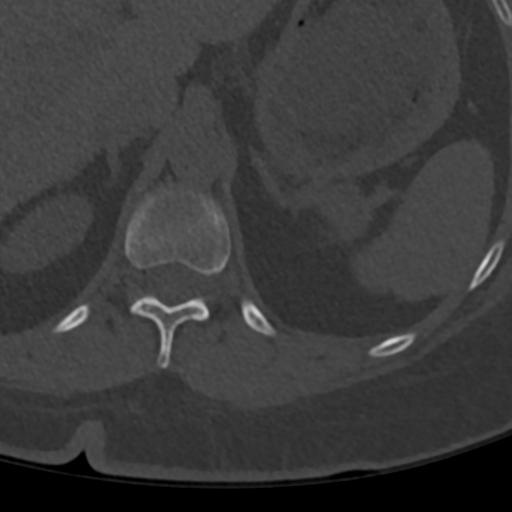
[im 50/150  bone]
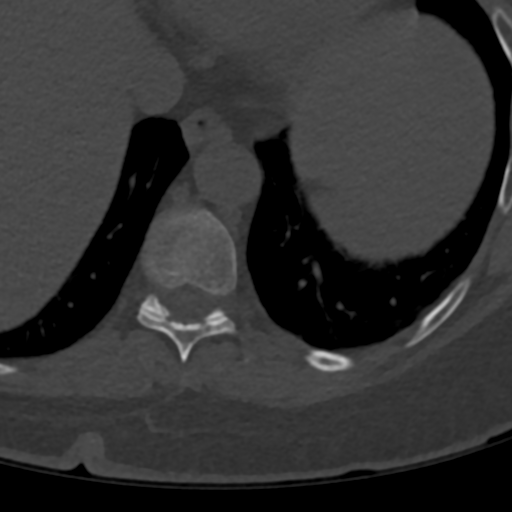
[im 100/150  bone]
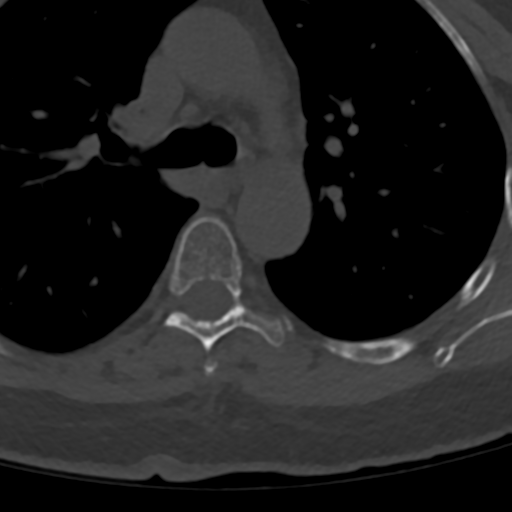
[im 125/150  bone]
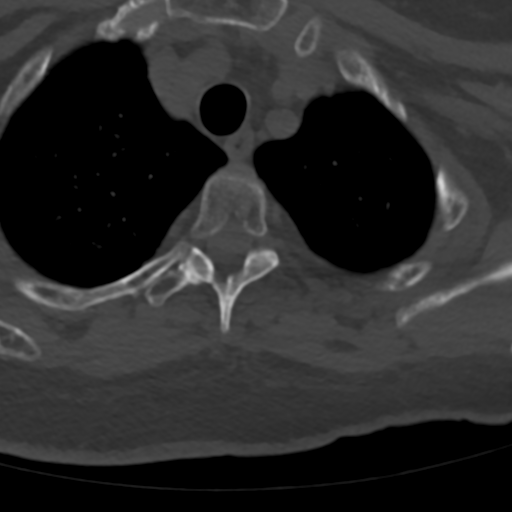

[10 of 33 positions shown; findings below may reference images not displayed]

FINDINGS: Alignment: No vertebral subluxation is observed.

Vertebrae: 20% superior endplate compression fracture at T12 is
likely subacute or acute. 2 mm posterior bony retropulsion along the
posterosuperior margin of T12. No significant paraspinal hematoma.

45% compression fracture at T5, chronic. 20% inferior endplate
compression fracture at T3, chronic. Broad Schmorl's node along the
inferior endplate of T7.

Paraspinal and other soft tissues: Left main and left anterior
descending coronary artery atherosclerosis.

Disc levels: No well-defined bony impingement.
IMPRESSION: 1. 20% superior endplate compression fracture at T12 is likely
subacute or acute. 2 mm posterior bony retropulsion along the
posterosuperior margin of T12.
2. 20% inferior endplate compression fracture at T3, chronic. 45%
chronic compression fracture at T5.
3. Left main and left anterior descending coronary artery
atherosclerosis.

## 2021-06-11 ENCOUNTER — Encounter (HOSPITAL_BASED_OUTPATIENT_CLINIC_OR_DEPARTMENT_OTHER): Payer: Self-pay

## 2021-06-11 ENCOUNTER — Other Ambulatory Visit: Payer: Self-pay

## 2021-06-11 ENCOUNTER — Inpatient Hospital Stay (HOSPITAL_BASED_OUTPATIENT_CLINIC_OR_DEPARTMENT_OTHER)
Admission: EM | Admit: 2021-06-11 | Discharge: 2021-06-14 | DRG: 638 | Disposition: A | Discharge status: Home / Self Care | Payer: Self-pay | Attending: Internal Medicine | Admitting: Internal Medicine

## 2021-06-11 DIAGNOSIS — Z7984 Long term (current) use of oral hypoglycemic drugs: Secondary | ICD-10-CM

## 2021-06-11 DIAGNOSIS — Z833 Family history of diabetes mellitus: Secondary | ICD-10-CM

## 2021-06-11 DIAGNOSIS — Z791 Long term (current) use of non-steroidal anti-inflammatories (NSAID): Secondary | ICD-10-CM

## 2021-06-11 DIAGNOSIS — L03317 Cellulitis of buttock: Secondary | ICD-10-CM

## 2021-06-11 DIAGNOSIS — E785 Hyperlipidemia, unspecified: Secondary | ICD-10-CM | POA: Diagnosis present

## 2021-06-11 DIAGNOSIS — I10 Essential (primary) hypertension: Secondary | ICD-10-CM | POA: Diagnosis present

## 2021-06-11 DIAGNOSIS — Z79899 Other long term (current) drug therapy: Secondary | ICD-10-CM

## 2021-06-11 DIAGNOSIS — A419 Sepsis, unspecified organism: Secondary | ICD-10-CM

## 2021-06-11 DIAGNOSIS — L0231 Cutaneous abscess of buttock: Secondary | ICD-10-CM | POA: Diagnosis present

## 2021-06-11 DIAGNOSIS — F32A Depression, unspecified: Secondary | ICD-10-CM | POA: Diagnosis present

## 2021-06-11 DIAGNOSIS — E1110 Type 2 diabetes mellitus with ketoacidosis without coma: Principal | ICD-10-CM | POA: Diagnosis present

## 2021-06-11 DIAGNOSIS — F419 Anxiety disorder, unspecified: Secondary | ICD-10-CM | POA: Diagnosis present

## 2021-06-11 DIAGNOSIS — Z596 Low income: Secondary | ICD-10-CM

## 2021-06-11 DIAGNOSIS — E119 Type 2 diabetes mellitus without complications: Secondary | ICD-10-CM

## 2021-06-11 DIAGNOSIS — Z8249 Family history of ischemic heart disease and other diseases of the circulatory system: Secondary | ICD-10-CM

## 2021-06-11 DIAGNOSIS — Z8349 Family history of other endocrine, nutritional and metabolic diseases: Secondary | ICD-10-CM

## 2021-06-11 DIAGNOSIS — Z825 Family history of asthma and other chronic lower respiratory diseases: Secondary | ICD-10-CM

## 2021-06-11 DIAGNOSIS — Z5901 Sheltered homelessness: Secondary | ICD-10-CM

## 2021-06-11 DIAGNOSIS — Z6823 Body mass index (BMI) 23.0-23.9, adult: Secondary | ICD-10-CM

## 2021-06-11 DIAGNOSIS — R634 Abnormal weight loss: Secondary | ICD-10-CM | POA: Diagnosis present

## 2021-06-11 LAB — URINALYSIS, ROUTINE W REFLEX MICROSCOPIC
Bilirubin Urine: NEGATIVE
Glucose, UA: >1000 mg/dL — AB
Ketones, ur: >80 mg/dL — AB
Nitrite: NEGATIVE
RBC / HPF: >50 RBC/hpf — ABNORMAL HIGH (ref 0–5)
Specific Gravity, Urine: 1.03 (ref 1.005–1.030)
WBC, UA: >50 WBC/hpf — ABNORMAL HIGH (ref 0–5)
pH: 5 (ref 5.0–8.0)

## 2021-06-11 LAB — CBG MONITORING, ED: Glucose-Capillary: >600 mg/dL (ref 70–99)

## 2021-06-11 LAB — PREGNANCY, URINE: Preg Test, Ur: NEGATIVE

## 2021-06-11 MED ORDER — SODIUM CHLORIDE 0.9 % IV BOLUS
1000.0000 mL | Freq: Once | INTRAVENOUS | Status: AC
  Administered 2021-06-12: 1000 mL via INTRAVENOUS

## 2021-06-11 MED ORDER — MORPHINE SULFATE (PF) 4 MG/ML IV SOLN
4.0000 mg | Freq: Once | INTRAVENOUS | Status: AC
  Administered 2021-06-12: 4 mg via INTRAVENOUS
  Filled 2021-06-11: qty 1

## 2021-06-11 MED ORDER — LIDOCAINE-EPINEPHRINE (PF) 2 %-1:200000 IJ SOLN
20.0000 mL | Freq: Once | INTRAMUSCULAR | Status: DC
  Filled 2021-06-11: qty 20

## 2021-06-11 MED ORDER — LIDOCAINE-EPINEPHRINE-TETRACAINE (LET) TOPICAL GEL
3.0000 mL | Freq: Once | TOPICAL | Status: DC
  Filled 2021-06-11: qty 3

## 2021-06-11 MED ORDER — ONDANSETRON HCL 4 MG/2ML IJ SOLN
4.0000 mg | Freq: Once | INTRAMUSCULAR | Status: AC
  Administered 2021-06-12: 4 mg via INTRAVENOUS
  Filled 2021-06-11: qty 2

## 2021-06-11 NOTE — ED Triage Notes (Signed)
Pt reports abscess on left buttocks, noticed it Thursday ?

## 2021-06-12 ENCOUNTER — Emergency Department (HOSPITAL_BASED_OUTPATIENT_CLINIC_OR_DEPARTMENT_OTHER): Payer: Self-pay

## 2021-06-12 DIAGNOSIS — E1110 Type 2 diabetes mellitus with ketoacidosis without coma: Principal | ICD-10-CM | POA: Diagnosis present

## 2021-06-12 DIAGNOSIS — L03317 Cellulitis of buttock: Secondary | ICD-10-CM

## 2021-06-12 LAB — I-STAT VENOUS BLOOD GAS, ED
Acid-base deficit: 7 mmol/L — ABNORMAL HIGH (ref 0.0–2.0)
Bicarbonate: 17 mmol/L — ABNORMAL LOW (ref 20.0–28.0)
Calcium, Ion: 1.32 mmol/L (ref 1.15–1.40)
HCT: 44 % (ref 36.0–46.0)
Hemoglobin: 15 g/dL (ref 12.0–15.0)
O2 Saturation: 90 %
Patient temperature: 98.5
Potassium: 4.3 mmol/L (ref 3.5–5.1)
Sodium: 135 mmol/L (ref 135–145)
TCO2: 18 mmol/L — ABNORMAL LOW (ref 22–32)
pCO2, Ven: 31.2 mmHg — ABNORMAL LOW (ref 44–60)
pH, Ven: 7.344 (ref 7.25–7.43)
pO2, Ven: 61 mmHg — ABNORMAL HIGH (ref 32–45)

## 2021-06-12 LAB — CBC WITH DIFFERENTIAL/PLATELET
Abs Immature Granulocytes: 0.1 10*3/uL — ABNORMAL HIGH (ref 0.00–0.07)
Band Neutrophils: 20 %
Basophils Absolute: 0 10*3/uL (ref 0.0–0.1)
Basophils Relative: 0 %
Eosinophils Absolute: 0 10*3/uL (ref 0.0–0.5)
Eosinophils Relative: 0 %
HCT: 46.4 % — ABNORMAL HIGH (ref 36.0–46.0)
Hemoglobin: 14.4 g/dL (ref 12.0–15.0)
Lymphocytes Relative: 6 %
Lymphs Abs: 0.7 10*3/uL (ref 0.7–4.0)
MCH: 29.1 pg (ref 26.0–34.0)
MCHC: 31 g/dL (ref 30.0–36.0)
MCV: 93.7 fL (ref 80.0–100.0)
Metamyelocytes Relative: 1 %
Monocytes Absolute: 0.7 10*3/uL (ref 0.1–1.0)
Monocytes Relative: 6 %
Neutro Abs: 10.7 10*3/uL — ABNORMAL HIGH (ref 1.7–7.7)
Neutrophils Relative %: 67 %
Platelets: 365 10*3/uL (ref 150–400)
RBC: 4.95 MIL/uL (ref 3.87–5.11)
RDW: 12.5 % (ref 11.5–15.5)
WBC: 12.3 10*3/uL — ABNORMAL HIGH (ref 4.0–10.5)
nRBC: 0 % (ref 0.0–0.2)

## 2021-06-12 LAB — GLUCOSE, CAPILLARY
Glucose-Capillary: 146 mg/dL — ABNORMAL HIGH (ref 70–99)
Glucose-Capillary: 148 mg/dL — ABNORMAL HIGH (ref 70–99)
Glucose-Capillary: 151 mg/dL — ABNORMAL HIGH (ref 70–99)
Glucose-Capillary: 155 mg/dL — ABNORMAL HIGH (ref 70–99)
Glucose-Capillary: 159 mg/dL — ABNORMAL HIGH (ref 70–99)
Glucose-Capillary: 161 mg/dL — ABNORMAL HIGH (ref 70–99)
Glucose-Capillary: 166 mg/dL — ABNORMAL HIGH (ref 70–99)
Glucose-Capillary: 169 mg/dL — ABNORMAL HIGH (ref 70–99)
Glucose-Capillary: 169 mg/dL — ABNORMAL HIGH (ref 70–99)
Glucose-Capillary: 178 mg/dL — ABNORMAL HIGH (ref 70–99)
Glucose-Capillary: 186 mg/dL — ABNORMAL HIGH (ref 70–99)
Glucose-Capillary: 202 mg/dL — ABNORMAL HIGH (ref 70–99)
Glucose-Capillary: 226 mg/dL — ABNORMAL HIGH (ref 70–99)
Glucose-Capillary: 258 mg/dL — ABNORMAL HIGH (ref 70–99)
Glucose-Capillary: 258 mg/dL — ABNORMAL HIGH (ref 70–99)

## 2021-06-12 LAB — BASIC METABOLIC PANEL
Anion gap: 9 (ref 5–15)
Anion gap: 6 (ref 5–15)
Anion gap: 6 (ref 5–15)
Anion gap: 4 — ABNORMAL LOW (ref 5–15)
BUN: 10 mg/dL (ref 6–20)
BUN: 9 mg/dL (ref 6–20)
BUN: 8 mg/dL (ref 6–20)
BUN: 7 mg/dL (ref 6–20)
CO2: 20 mmol/L — ABNORMAL LOW (ref 22–32)
CO2: 23 mmol/L (ref 22–32)
CO2: 23 mmol/L (ref 22–32)
CO2: 22 mmol/L (ref 22–32)
Calcium: 8.3 mg/dL — ABNORMAL LOW (ref 8.9–10.3)
Calcium: 8.1 mg/dL — ABNORMAL LOW (ref 8.9–10.3)
Calcium: 8.3 mg/dL — ABNORMAL LOW (ref 8.9–10.3)
Calcium: 7.9 mg/dL — ABNORMAL LOW (ref 8.9–10.3)
Chloride: 113 mmol/L — ABNORMAL HIGH (ref 98–111)
Chloride: 113 mmol/L — ABNORMAL HIGH (ref 98–111)
Chloride: 112 mmol/L — ABNORMAL HIGH (ref 98–111)
Chloride: 112 mmol/L — ABNORMAL HIGH (ref 98–111)
Creatinine, Ser: 0.6 mg/dL (ref 0.44–1.00)
Creatinine, Ser: 0.51 mg/dL (ref 0.44–1.00)
Creatinine, Ser: 0.58 mg/dL (ref 0.44–1.00)
Creatinine, Ser: 0.48 mg/dL (ref 0.44–1.00)
GFR, Estimated: >60 mL/min (ref >60)
GFR, Estimated: >60 mL/min (ref >60)
GFR, Estimated: >60 mL/min (ref >60)
GFR, Estimated: >60 mL/min (ref >60)
Glucose, Bld: 225 mg/dL — ABNORMAL HIGH (ref 70–99)
Glucose, Bld: 158 mg/dL — ABNORMAL HIGH (ref 70–99)
Glucose, Bld: 150 mg/dL — ABNORMAL HIGH (ref 70–99)
Glucose, Bld: 191 mg/dL — ABNORMAL HIGH (ref 70–99)
Potassium: 3.7 mmol/L (ref 3.5–5.1)
Potassium: 3.1 mmol/L — ABNORMAL LOW (ref 3.5–5.1)
Potassium: 3.2 mmol/L — ABNORMAL LOW (ref 3.5–5.1)
Potassium: 3.8 mmol/L (ref 3.5–5.1)
Sodium: 142 mmol/L (ref 135–145)
Sodium: 142 mmol/L (ref 135–145)
Sodium: 141 mmol/L (ref 135–145)
Sodium: 138 mmol/L (ref 135–145)

## 2021-06-12 LAB — BASIC METABOLIC PANEL WITH GFR
Anion gap: 18 — ABNORMAL HIGH (ref 5–15)
BUN: 14 mg/dL (ref 6–20)
CO2: 14 mmol/L — ABNORMAL LOW (ref 22–32)
Calcium: 9.1 mg/dL (ref 8.9–10.3)
Chloride: 110 mmol/L (ref 98–111)
Creatinine, Ser: 0.58 mg/dL (ref 0.44–1.00)
GFR, Estimated: >60 mL/min
Glucose, Bld: 339 mg/dL — ABNORMAL HIGH (ref 70–99)
Potassium: 3.8 mmol/L (ref 3.5–5.1)
Sodium: 142 mmol/L (ref 135–145)

## 2021-06-12 LAB — CBC
HCT: 40.6 % (ref 36.0–46.0)
Hemoglobin: 12.8 g/dL (ref 12.0–15.0)
MCH: 30.7 pg (ref 26.0–34.0)
MCHC: 31.5 g/dL (ref 30.0–36.0)
MCV: 97.4 fL (ref 80.0–100.0)
Platelets: 295 10*3/uL (ref 150–400)
RBC: 4.17 MIL/uL (ref 3.87–5.11)
RDW: 12.5 % (ref 11.5–15.5)
WBC: 10.5 10*3/uL (ref 4.0–10.5)
nRBC: 0 % (ref 0.0–0.2)

## 2021-06-12 LAB — COMPREHENSIVE METABOLIC PANEL
ALT: 9 U/L (ref 0–44)
AST: 6 U/L — ABNORMAL LOW (ref 15–41)
Albumin: 3.5 g/dL (ref 3.5–5.0)
Alkaline Phosphatase: 120 U/L (ref 38–126)
Anion gap: 25 — ABNORMAL HIGH (ref 5–15)
BUN: 18 mg/dL (ref 6–20)
CO2: 18 mmol/L — ABNORMAL LOW (ref 22–32)
Calcium: 10.8 mg/dL — ABNORMAL HIGH (ref 8.9–10.3)
Chloride: 91 mmol/L — ABNORMAL LOW (ref 98–111)
Creatinine, Ser: 0.97 mg/dL (ref 0.44–1.00)
GFR, Estimated: >60 mL/min (ref >60)
Glucose, Bld: 646 mg/dL (ref 70–99)
Potassium: 4.5 mmol/L (ref 3.5–5.1)
Sodium: 134 mmol/L — ABNORMAL LOW (ref 135–145)
Total Bilirubin: 0.4 mg/dL (ref 0.3–1.2)
Total Protein: 8.4 g/dL — ABNORMAL HIGH (ref 6.5–8.1)

## 2021-06-12 LAB — BETA-HYDROXYBUTYRIC ACID
Beta-Hydroxybutyric Acid: 3.45 mmol/L — ABNORMAL HIGH (ref 0.05–0.27)
Beta-Hydroxybutyric Acid: 4.15 mmol/L — ABNORMAL HIGH (ref 0.05–0.27)
Beta-Hydroxybutyric Acid: 1.8 mmol/L — ABNORMAL HIGH (ref 0.05–0.27)

## 2021-06-12 LAB — LACTIC ACID, PLASMA: Lactic Acid, Venous: 1.8 mmol/L (ref 0.5–1.9)

## 2021-06-12 LAB — CBG MONITORING, ED
Glucose-Capillary: 348 mg/dL — ABNORMAL HIGH (ref 70–99)
Glucose-Capillary: 474 mg/dL — ABNORMAL HIGH (ref 70–99)
Glucose-Capillary: 188 mg/dL — ABNORMAL HIGH (ref 70–99)
Glucose-Capillary: 301 mg/dL — ABNORMAL HIGH (ref 70–99)
Glucose-Capillary: 294 mg/dL — ABNORMAL HIGH (ref 70–99)
Glucose-Capillary: 240 mg/dL — ABNORMAL HIGH (ref 70–99)

## 2021-06-12 LAB — BLOOD GAS, VENOUS
Acid-base deficit: 2.7 mmol/L — ABNORMAL HIGH (ref 0.0–2.0)
Bicarbonate: 22.6 mmol/L (ref 20.0–28.0)
O2 Saturation: 92.2 %
Patient temperature: 36.4
pCO2, Ven: 39 mmHg — ABNORMAL LOW (ref 44–60)
pH, Ven: 7.37 (ref 7.25–7.43)
pO2, Ven: 61 mmHg — ABNORMAL HIGH (ref 32–45)

## 2021-06-12 LAB — HIV ANTIBODY (ROUTINE TESTING W REFLEX): HIV Screen 4th Generation wRfx: NONREACTIVE

## 2021-06-12 MED ORDER — ENOXAPARIN SODIUM 40 MG/0.4ML IJ SOSY
40.0000 mg | PREFILLED_SYRINGE | Freq: Every day | INTRAMUSCULAR | Status: DC
  Administered 2021-06-12 – 2021-06-14 (×3): 40 mg via SUBCUTANEOUS
  Filled 2021-06-12 (×3): qty 0.4

## 2021-06-12 MED ORDER — CHLORHEXIDINE GLUCONATE CLOTH 2 % EX PADS
6.0000 | MEDICATED_PAD | Freq: Every day | CUTANEOUS | Status: DC
  Administered 2021-06-12 – 2021-06-13 (×2): 6 via TOPICAL

## 2021-06-12 MED ORDER — VANCOMYCIN HCL 750 MG/150ML IV SOLN
750.0000 mg | Freq: Two times a day (BID) | INTRAVENOUS | Status: DC
  Administered 2021-06-12 – 2021-06-13 (×4): 750 mg via INTRAVENOUS
  Filled 2021-06-12 (×5): qty 150

## 2021-06-12 MED ORDER — SODIUM CHLORIDE 0.9 % IV BOLUS (SEPSIS)
1000.0000 mL | Freq: Once | INTRAVENOUS | Status: AC
  Administered 2021-06-12: 1000 mL via INTRAVENOUS

## 2021-06-12 MED ORDER — SODIUM CHLORIDE 0.9 % IV SOLN: Freq: Once | INTRAVENOUS | Status: AC

## 2021-06-12 MED ORDER — MORPHINE SULFATE (PF) 4 MG/ML IV SOLN: 4.0000 mg | INTRAVENOUS | Status: DC | PRN

## 2021-06-12 MED ORDER — PROCHLORPERAZINE EDISYLATE 10 MG/2ML IJ SOLN: 10.0000 mg | Freq: Four times a day (QID) | INTRAMUSCULAR | Status: DC | PRN

## 2021-06-12 MED ORDER — HYDRALAZINE HCL 20 MG/ML IJ SOLN: 10.0000 mg | Freq: Three times a day (TID) | INTRAMUSCULAR | Status: DC | PRN

## 2021-06-12 MED ORDER — SODIUM CHLORIDE 0.9 % IV SOLN
1.0000 g | INTRAVENOUS | Status: AC
  Administered 2021-06-12: 1 g via INTRAVENOUS
  Filled 2021-06-12 (×4): qty 10

## 2021-06-12 MED ORDER — POTASSIUM CHLORIDE 10 MEQ/100ML IV SOLN
10.0000 meq | INTRAVENOUS | Status: AC
  Administered 2021-06-12 (×3): 10 meq via INTRAVENOUS
  Filled 2021-06-12 (×3): qty 100

## 2021-06-12 MED ORDER — OXYCODONE HCL 5 MG PO TABS
5.0000 mg | ORAL_TABLET | ORAL | Status: DC | PRN
  Administered 2021-06-12 – 2021-06-14 (×5): 5 mg via ORAL
  Filled 2021-06-12 (×5): qty 1

## 2021-06-12 MED ORDER — DEXTROSE 50 % IV SOLN: 0.0000 mL | INTRAVENOUS | Status: DC | PRN

## 2021-06-12 MED ORDER — INSULIN REGULAR(HUMAN) IN NACL 100-0.9 UT/100ML-% IV SOLN
INTRAVENOUS | Status: AC
  Administered 2021-06-12: 10.5 [IU]/h via INTRAVENOUS
  Filled 2021-06-12: qty 100

## 2021-06-12 MED ORDER — POTASSIUM CHLORIDE 10 MEQ/100ML IV SOLN
10.0000 meq | INTRAVENOUS | Status: AC
  Administered 2021-06-12 (×2): 10 meq via INTRAVENOUS
  Filled 2021-06-12 (×2): qty 100

## 2021-06-12 MED ORDER — VANCOMYCIN HCL IN DEXTROSE 1-5 GM/200ML-% IV SOLN
1000.0000 mg | Freq: Once | INTRAVENOUS | Status: AC
  Administered 2021-06-12: 1000 mg via INTRAVENOUS
  Filled 2021-06-12: qty 200

## 2021-06-12 MED ORDER — CEFTRIAXONE SODIUM 1 G IJ SOLR
1.0000 g | Freq: Once | INTRAMUSCULAR | Status: AC
  Administered 2021-06-12: 1 g via INTRAVENOUS

## 2021-06-12 MED ORDER — ACETAMINOPHEN 650 MG RE SUPP: 650.0000 mg | Freq: Four times a day (QID) | RECTAL | Status: DC | PRN

## 2021-06-12 MED ORDER — LACTATED RINGERS IV SOLN: INTRAVENOUS | Status: AC

## 2021-06-12 MED ORDER — IOHEXOL 300 MG/ML  SOLN
100.0000 mL | Freq: Once | INTRAMUSCULAR | Status: AC | PRN
  Administered 2021-06-12: 100 mL via INTRAVENOUS

## 2021-06-12 MED ORDER — POTASSIUM CHLORIDE 10 MEQ/100ML IV SOLN
INTRAVENOUS | Status: AC
  Administered 2021-06-12: 10 meq via INTRAVENOUS
  Filled 2021-06-12: qty 100

## 2021-06-12 MED ORDER — SODIUM CHLORIDE 0.9 % IV SOLN
2.0000 g | INTRAVENOUS | Status: DC
  Administered 2021-06-13 – 2021-06-14 (×2): 2 g via INTRAVENOUS
  Filled 2021-06-12 (×2): qty 20

## 2021-06-12 MED ORDER — ACETAMINOPHEN 325 MG PO TABS
650.0000 mg | ORAL_TABLET | Freq: Four times a day (QID) | ORAL | Status: DC | PRN
  Administered 2021-06-12 – 2021-06-13 (×2): 650 mg via ORAL
  Filled 2021-06-12 (×2): qty 2

## 2021-06-12 MED ORDER — METRONIDAZOLE 500 MG/100ML IV SOLN
500.0000 mg | Freq: Two times a day (BID) | INTRAVENOUS | Status: DC
  Administered 2021-06-12 – 2021-06-14 (×5): 500 mg via INTRAVENOUS
  Filled 2021-06-12 (×5): qty 100

## 2021-06-12 MED ORDER — DEXTROSE IN LACTATED RINGERS 5 % IV SOLN: INTRAVENOUS | Status: DC

## 2021-06-12 MED ORDER — LACTATED RINGERS IV SOLN: INTRAVENOUS | Status: DC

## 2021-06-12 NOTE — ED Notes (Signed)
Transport report given to [facility] ?

## 2021-06-12 NOTE — Progress Notes (Addendum)
Pharmacy Antibiotic Note ? ?Emily [name] is a 46 y.o. female admitted on 06/11/2021 with cellulitis.  Pharmacy has been consulted for vancomycin dosing. ?Ct pelvis w/ contrast. Marked severity cellulitis involving the left gluteal region and posterior aspect of the proximal left thigh. ?Vancomycin 1 gm given at 0034 am & CTX 1 gm given at 0150 am. ?Afebrile, WBC 12.2, SCr WNL ? ?Plan: ?Vancomycin 750 mg IV q12 hr for estimated AUC 500 with Css min 14.4 using SCr 0.8, Vd 0.72 ?Ceftriaxone 2 gm IV q24 ?Flagyl 500 mg IV q12 ?F/u renal function, WBC, temp,culture date ?Vancomycin levels as needed ? ?Height: 5\' 4"  (162.6 cm) ?Weight: 61.2 kg (135 lb) ?IBW/kg (Calculated) : 54.7 ? ?Temp (24hrs), Avg:98.6 ?F (37 ?C), Min:98.5 ?F (36.9 ?C), Max:98.6 ?F (37 ?C) ? ?Recent Labs  ?Lab 06/11/21 ?06/13/21 06/11/21 ?06/13/21 06/12/21 ?0436  ?WBC 12.3*  --   --   ?CREATININE 0.97  --  0.58  ?LATICACIDVEN  --  1.8  --   ?  ?Estimated Creatinine Clearance: 76.7 mL/min (by C-G formula based on SCr of 0.58 mg/dL).   ? ?Allergies  ?Allergen Reactions  ? Methocarbamol Other (See Comments)  ?  "causing sores inside mouth"  ? Zolpidem Tartrate   ?  Hallucinations lasting almost 24 hrs  ? ? ?Antimicrobials this admission: ?4/17 CTX>> ?4/17 vanc>> ?4/17 flagyl>> ?Dose adjustments this admission: ? ?Microbiology results: ?4/17 BCx2:  ?4/17 UCx: ? ?Thank you for allowing pharmacy to be a part of this patient?s care. ? ?5/17, Pharm.D ?06/12/2021 10:58 AM ? ?

## 2021-06-12 NOTE — ED Notes (Signed)
[facility] at bedside preparing patient for transport.  

## 2021-06-12 NOTE — ED Notes (Signed)
Dr. Dina Rich aware that blood sugar is 646 ?

## 2021-06-12 NOTE — TOC Progression Note (Signed)
Transition of Care (TOC) - Progression Note  ? ? ?Patient Details  ?Name: Emily [name] ?MRN: [id number] ?Date of Birth: Jul 22, 1975 ? ?Transition of Care (TOC) CM/SW Contact  ?Geni Bers, RN ?Phone Number: ?06/12/2021, 10:32 AM ? ?Clinical Narrative:    ? ? ?Transition of Care (TOC) Screening Note ? ? ?Patient Details  ?Name: Emily [name] ?Date of Birth: 10-21-1975 ? ? ?Transition of Care (TOC) CM/SW Contact:    ?Geni Bers, RN ?Phone Number: ?06/12/2021, 10:32 AM ? ? ? ?Transition of Care Department [facility]) has reviewed patient and no TOC needs have been identified at this time. We will continue to monitor patient advancement through interdisciplinary progression rounds. If new patient transition needs arise, please place a TOC consult. ?  ? ?  ?  ? ?Expected Discharge Plan and Services ?  ?  ?  ?  ?  ?                ?  ?  ?  ?  ?  ?  ?  ?  ?  ?  ? ? ?Social Determinants of Health (SDOH) Interventions ?  ? ?Readmission Risk Interventions ?   ? View : No data to display.  ?  ?  ?  ? ? ?

## 2021-06-12 NOTE — Progress Notes (Signed)
Inpatient Diabetes Program Recommendations ? ?AACE/ADA: New Consensus Statement on Inpatient Glycemic Control (2015) ? ?Target Ranges:  Prepandial:   less than 140 mg/dL ?     Peak postprandial:   less than 180 mg/dL (1-2 hours) ?     Critically ill patients:  140 - 180 mg/dL  ? ?Lab Results  ?Component Value Date  ? GLUCAP 240 (H) 06/12/2021  ? HGBA1C 9.3 (H) 05/22/2017  ? ? ?Review of Glycemic Control ? Latest Reference Range & Units 06/12/21 04:36  ?Glucose 70 - 99 mg/dL 696 (H)  ?(H): Data is abnormally high ?Diabetes history: Type 2 DM ?Outpatient Diabetes medications: Trulicity 1.5 mg qwk, Jardiance 25 mg QD, Metformin 1000/500/1000 mg B/L/D ?Current orders for Inpatient glycemic control: IV insulin ? ?Inpatient Diabetes Program Recommendations:   ? ?Consider adding A1C?  ? ?Patient in transport to [facility]. Will see when appropriate.  ? ?Thanks, ?Lujean Rave, MSN, RNC-OB ?Diabetes Coordinator ?[phone] (8a-5p) ? ? ? ? ?

## 2021-06-12 NOTE — Progress Notes (Signed)
Sepsis tracking by eLINK 

## 2021-06-12 NOTE — ED Notes (Signed)
RT ran VBG with the following results. MD aware of results and any criticals that may have been on VBG.  ? ? Latest Reference Range & Units 06/12/21 00:36  ?Sample type  VENOUS  ?pH, Ven 7.25 - 7.43  7.344  ?pCO2, Ven 44 - 60 mmHg 31.2 (L)  ?pO2, Ven 32 - 45 mmHg 61 (H)  ?TCO2 22 - 32 mmol/L 18 (L)  ?Acid-base deficit 0.0 - 2.0 mmol/L 7.0 (H)  ?Bicarbonate 20.0 - 28.0 mmol/L 17.0 (L)  ?O2 Saturation % 90  ?Patient temperature  98.5 F  ?Collection site  IV start  ? ?

## 2021-06-12 NOTE — ED Notes (Signed)
This RN assisted patient's mother with giving her a modified bed bath and performed peri-care.   ?

## 2021-06-12 NOTE — Progress Notes (Signed)
Blood sugar machine had to be override due to mismatch barcode, from patient coming from [place]. Blood sugar at 0850 was 258. ?

## 2021-06-12 NOTE — ED Provider Notes (Signed)
?[facility]PT ?Provider Note ? ? ?CSN: [id number] ?Arrival date & time: 06/11/21  2057 ? ?  ? ?History ? ?Chief Complaint  ?Patient presents with  ? Abscess  ? ? ?Emily [name] is a 46 y.o. female. ? ?HPI ? ?  ? ?This is a 46 year old female with a history of diabetes who presents with concerns for buttock pain and infection.  1 week history of pain over the left buttock.  Has not noted any drainage.  Patient reports that she has diabetes but has not taken her diabetes medicine because "I am having trouble getting it and having trouble with insurance."  She is unsure what her last blood sugars have been but "I am sure they have been high."  She has not had any fevers that she knows of.  No chest pain or abdominal pain.  No shortness of breath.  She states "I am just a mess."  Mother is at the bedside.  Reports unintentional weight loss recently. ? ?Home Medications ?Prior to Admission medications   ?Medication Sig Start Date End Date Taking? Authorizing Provider  ?albuterol (PROVENTIL HFA;VENTOLIN HFA) 108 (90 BASE) MCG/ACT inhaler Inhale 2 puffs into the lungs every 4 (four) hours as needed for wheezing.    [provider]  ?atorvastatin (LIPITOR) 20 MG tablet TAKE 1 TABLET BY MOUTH EVERY DAY 07/16/17   Pincus Sanes, MD  ?cholecalciferol (VITAMIN D) 1000 units tablet Take 1 tablet (1,000 Units total) by mouth daily. 01/22/17   Pincus Sanes, MD  ?DimenhyDRINATE (MOTION SICKNESS PO) Take 1 tablet by mouth every 6 (six) hours as needed (vertigo, diziness).    [provider]  ?Dulaglutide (TRULICITY) 1.5 MG/0.5ML SOPN Inject into the skin once a week.    [provider]  ?DULoxetine (CYMBALTA) 60 MG capsule Take 1 capsule (60 mg total) by mouth daily. 05/22/17   Pincus Sanes, MD  ?empagliflozin (JARDIANCE) 25 MG TABS tablet Take 25 mg by mouth daily.    [provider]  ?gabapentin (NEURONTIN) 100 MG capsule Take 100 mg in evening in addition to 300  mg, increase to 200 mg in evening in addition to 300 mg if tolerated 04/02/16   Pincus Sanes, MD  ?gabapentin (NEURONTIN) 300 MG capsule TAKE 1 CAPSULE BY MOUTH 3 TIMES DAILY ?Patient taking differently: 400 mg 2 (two) times daily. 06/18/17   Pincus Sanes, MD  ?ibuprofen (ADVIL) 600 MG tablet Take 1 tablet (600 mg total) by mouth every 6 (six) hours as needed. Take with food. Drink plenty of fluids/stay well hydrated. 05/10/21   Cathren Laine, MD  ?lidocaine (LIDODERM) 5 % Place 1 patch onto the skin daily. Place 1 patch onto area of most significant pain once per day.  Remove & Discard patch within 12 hours. 06/27/19   Petrucelli, Samantha R, PA-C  ?loperamide (IMODIUM) 2 MG capsule Take 1 capsule (2 mg total) by mouth 4 (four) times daily as needed for diarrhea or loose stools. 03/29/16   Audry Pili, PA-C  ?meclizine (ANTIVERT) 25 MG tablet Take 1 tablet (25 mg total) by mouth 3 (three) times daily as needed for dizziness. 04/02/16   Pincus Sanes, MD  ?medroxyPROGESTERone (DEPO-PROVERA) 150 MG/ML injection Inject 150 mg into the muscle every 3 (three) months.    [provider]  ?meloxicam (MOBIC) 15 MG tablet Take 1 tablet (15 mg total) by mouth daily. 06/08/16   Helane Gunther, DPM  ?metFORMIN (GLUCOPHAGE) 1000 MG tablet 1000 mg with breakfast,  500 mg with lunch, 1000 mg with dinner 05/25/17   Pincus SanesBurns, Stacy J, MD  ?methocarbamol (ROBAXIN) 500 MG tablet Take 1 tablet (500 mg total) by mouth every 8 (eight) hours as needed for muscle spasms. 06/27/19   Petrucelli, Samantha R, PA-C  ?metoprolol tartrate (LOPRESSOR) 25 MG tablet TAKE 1 TABLET BY MOUTH EVERY DAY 07/16/17   Pincus SanesBurns, Stacy J, MD  ?ondansetron (ZOFRAN) 4 MG tablet TAKE 1 TABLET BY MOUTH EVERY 6 HOURS 02/06/17   Burns, Bobette MoStacy J, MD  ?tiZANidine (ZANAFLEX) 4 MG tablet Take 1 tablet (4 mg total) by mouth at bedtime. 10/19/20   Wallis BambergMani, Mario, PA-C  ?   ? ?Allergies    ?Methocarbamol and Zolpidem tartrate   ? ?Review of Systems   ?Review of Systems   ?Constitutional:  Positive for unexpected weight change. Negative for fever.  ?Respiratory:  Negative for shortness of breath.   ?Cardiovascular:  Negative for chest pain.  ?Gastrointestinal:  Negative for abdominal pain, nausea and vomiting.  ?Genitourinary:  Negative for dysuria.  ?Skin:  Positive for color change.  ?All other systems reviewed and are negative. ? ?Physical Exam ?Updated Vital Signs ?BP 101/72   Pulse (!) 109   Temp 98.5 ?F (36.9 ?C)   Resp 15   Ht 1.626 m (5\' 4" )   Wt 61.2 kg   SpO2 97%   BMI 23.17 kg/m?  ?Physical Exam ?Vitals and nursing note reviewed.  ?Constitutional:   ?   Appearance: She is well-developed. She is obese. She is ill-appearing. She is not toxic-appearing.  ?HENT:  ?   Head: Normocephalic and atraumatic.  ?   Nose: Nose normal.  ?   Mouth/Throat:  ?   Mouth: Mucous membranes are moist.  ?Eyes:  ?   Pupils: Pupils are equal, round, and reactive to light.  ?Cardiovascular:  ?   Rate and Rhythm: Regular rhythm. Tachycardia present.  ?   Heart sounds: Normal heart sounds.  ?Pulmonary:  ?   Effort: Pulmonary effort is normal. No respiratory distress.  ?   Breath sounds: No wheezing.  ?Abdominal:  ?   Palpations: Abdomen is soft.  ?   Tenderness: There is no abdominal tenderness.  ?Genitourinary: ?   Comments: Fluctuance and induration left butt cheek adjacent to the anus, no spontaneous drainage noted, no crepitus ?Musculoskeletal:  ?   Cervical back: Neck supple.  ?Skin: ?   General: Skin is warm and dry.  ?Neurological:  ?   Mental Status: She is alert and oriented to person, place, and time.  ?Psychiatric:     ?   Mood and Affect: Mood normal.  ? ? ?ED Results / Procedures / Treatments   ?Labs ?(all labs ordered are listed, but only abnormal results are displayed) ?Labs Reviewed  ?URINALYSIS, ROUTINE W REFLEX MICROSCOPIC - Abnormal; Notable for the following components:  ?    Result Value  ? APPearance HAZY (*)   ? Glucose, UA >1,000 (*)   ? Hgb urine dipstick SMALL (*)    ? Ketones, ur >80 (*)   ? Protein, ur TRACE (*)   ? Leukocytes,Ua LARGE (*)   ? RBC / HPF >50 (*)   ? WBC, UA >50 (*)   ? Bacteria, UA FEW (*)   ? All other components within normal limits  ?CBC WITH DIFFERENTIAL/PLATELET - Abnormal; Notable for the following components:  ? WBC 12.3 (*)   ? HCT 46.4 (*)   ? Neutro Abs 10.7 (*)   ? Abs  Immature Granulocytes 0.10 (*)   ? All other components within normal limits  ?COMPREHENSIVE METABOLIC PANEL - Abnormal; Notable for the following components:  ? Sodium 134 (*)   ? Chloride 91 (*)   ? CO2 18 (*)   ? Glucose, Bld 646 (*)   ? Calcium 10.8 (*)   ? Total Protein 8.4 (*)   ? AST 6 (*)   ? Anion gap 25 (*)   ? All other components within normal limits  ?CBG MONITORING, ED - Abnormal; Notable for the following components:  ? Glucose-Capillary >600 (*)   ? All other components within normal limits  ?I-STAT VENOUS BLOOD GAS, ED - Abnormal; Notable for the following components:  ? pCO2, Ven 31.2 (*)   ? pO2, Ven 61 (*)   ? Bicarbonate 17.0 (*)   ? TCO2 18 (*)   ? Acid-base deficit 7.0 (*)   ? All other components within normal limits  ?CBG MONITORING, ED - Abnormal; Notable for the following components:  ? Glucose-Capillary 474 (*)   ? All other components within normal limits  ?CBG MONITORING, ED - Abnormal; Notable for the following components:  ? Glucose-Capillary 348 (*)   ? All other components within normal limits  ?CULTURE, BLOOD (ROUTINE X 2)  ?CULTURE, BLOOD (ROUTINE X 2)  ?URINE CULTURE  ?PREGNANCY, URINE  ?LACTIC ACID, PLASMA  ?LACTIC ACID, PLASMA  ?BLOOD GAS, VENOUS  ?PREGNANCY, URINE  ?BETA-HYDROXYBUTYRIC ACID  ?BETA-HYDROXYBUTYRIC ACID  ?BETA-HYDROXYBUTYRIC ACID  ? ? ?EKG ?None ? ?Radiology ?CT PELVIS W CONTRAST ? ?Result Date: 06/12/2021 ?CLINICAL DATA:  Evaluate left buttock abscess. EXAM: CT PELVIS WITH CONTRAST TECHNIQUE: Multidetector CT imaging of the pelvis was performed using the standard protocol following the bolus administration of intravenous contrast.  RADIATION DOSE REDUCTION: This exam was performed according to the departmental dose-optimization program which includes automated exposure control, adjustment of the mA and/or kV according to patient size and/or use of it

## 2021-06-12 NOTE — H&P (Signed)
?History and Physical  ? ? ?Patient: Emily [name] HBZ:[id number] DOB: Nov 08, 1975 ?DOA: 06/11/2021 ?DOS: the patient was seen and examined on 06/12/2021 ?PCP: Verlon Au, MD  ?Patient coming from: Home ? ?Chief Complaint:  ?Chief Complaint  ?Patient presents with  ? Abscess  ? ?HPI: Emily [name] is a 46 y.o. female with medical history significant of DM2, anxiety, HTN, HLD. Presenting with left gluteal pain. Pain started 4 days ago. She initially thought it was a hemorrhoid. She tried cabbage leaves, lidocaine, and hemorrhoid cream, but they didn't help. She tried ibuprofen but it didn't help. She had nausea, but no F/V/D. When her symptoms didn't improve last night, she came to the ED for help. She denies any other aggravating or alleviating factors.   ?  ?Review of Systems: As mentioned in the history of present illness. All other systems reviewed and are negative. ?Past Medical History:  ?Diagnosis Date  ? ANXIETY   ? Anxiety 05/18/2008  ? Qualifier: Diagnosis of  By: Shawnie Pons MD, Kenney Houseman    ? DEPRESSION   ? Depression 07/20/2008  ? Qualifier: Diagnosis of  By: Shawnie Pons MD, Kenney Houseman    ? DIABETES MELLITUS, TYPE II dx 04/2009  ? GERD   ? Hyperlipidemia 01/21/2017  ? Hypertension   ? Obesity, unspecified   ? PANIC DISORDER   ? ?Past Surgical History:  ?Procedure Laterality Date  ? NO PAST SURGERIES    ? SPINE SURGERY  07/20/2010  ? ?Social History:  reports that she has never smoked. She has never used smokeless tobacco. She reports that she does not drink alcohol and does not use drugs. ? ?Allergies  ?Allergen Reactions  ? Methocarbamol Other (See Comments)  ?  "causing sores inside mouth"  ? Zolpidem Tartrate   ?  Hallucinations lasting almost 24 hrs  ? ? ?Family History  ?Problem Relation Age of Onset  ? Sarcoidosis Mother 21  ? Asthma Mother   ? Hyperlipidemia Mother   ? Hypertension Mother   ? Coronary artery disease Father   ?     Had CABG, and angioplasty around age 68  ? Diabetes Father   ? Pulmonary  fibrosis Sister 30  ? Asthma Sister   ? ? ?Prior to Admission medications   ?Medication Sig Start Date End Date Taking? Authorizing Provider  ?albuterol (PROVENTIL HFA;VENTOLIN HFA) 108 (90 BASE) MCG/ACT inhaler Inhale 2 puffs into the lungs every 4 (four) hours as needed for wheezing.    [provider]  ?atorvastatin (LIPITOR) 20 MG tablet TAKE 1 TABLET BY MOUTH EVERY DAY 07/16/17   Pincus Sanes, MD  ?cholecalciferol (VITAMIN D) 1000 units tablet Take 1 tablet (1,000 Units total) by mouth daily. 01/22/17   Pincus Sanes, MD  ?DimenhyDRINATE (MOTION SICKNESS PO) Take 1 tablet by mouth every 6 (six) hours as needed (vertigo, diziness).    [provider]  ?Dulaglutide (TRULICITY) 1.5 MG/0.5ML SOPN Inject into the skin once a week.    [provider]  ?DULoxetine (CYMBALTA) 60 MG capsule Take 1 capsule (60 mg total) by mouth daily. 05/22/17   Pincus Sanes, MD  ?empagliflozin (JARDIANCE) 25 MG TABS tablet Take 25 mg by mouth daily.    [provider]  ?gabapentin (NEURONTIN) 100 MG capsule Take 100 mg in evening in addition to 300 mg, increase to 200 mg in evening in addition to 300 mg if tolerated 04/02/16   Pincus Sanes, MD  ?gabapentin (NEURONTIN) 300 MG capsule TAKE 1 CAPSULE BY MOUTH  3 TIMES DAILY ?Patient taking differently: 400 mg 2 (two) times daily. 06/18/17   Pincus Sanes, MD  ?ibuprofen (ADVIL) 600 MG tablet Take 1 tablet (600 mg total) by mouth every 6 (six) hours as needed. Take with food. Drink plenty of fluids/stay well hydrated. 05/10/21   Cathren Laine, MD  ?lidocaine (LIDODERM) 5 % Place 1 patch onto the skin daily. Place 1 patch onto area of most significant pain once per day.  Remove & Discard patch within 12 hours. 06/27/19   Petrucelli, Samantha R, PA-C  ?loperamide (IMODIUM) 2 MG capsule Take 1 capsule (2 mg total) by mouth 4 (four) times daily as needed for diarrhea or loose stools. 03/29/16   Audry Pili, PA-C  ?meclizine (ANTIVERT) 25 MG tablet Take 1 tablet  (25 mg total) by mouth 3 (three) times daily as needed for dizziness. 04/02/16   Pincus Sanes, MD  ?medroxyPROGESTERone (DEPO-PROVERA) 150 MG/ML injection Inject 150 mg into the muscle every 3 (three) months.    [provider]  ?meloxicam (MOBIC) 15 MG tablet Take 1 tablet (15 mg total) by mouth daily. 06/08/16   Helane Gunther, DPM  ?metFORMIN (GLUCOPHAGE) 1000 MG tablet 1000 mg with breakfast, 500 mg with lunch, 1000 mg with dinner 05/25/17   Pincus Sanes, MD  ?methocarbamol (ROBAXIN) 500 MG tablet Take 1 tablet (500 mg total) by mouth every 8 (eight) hours as needed for muscle spasms. 06/27/19   Petrucelli, Samantha R, PA-C  ?metoprolol tartrate (LOPRESSOR) 25 MG tablet TAKE 1 TABLET BY MOUTH EVERY DAY 07/16/17   Pincus Sanes, MD  ?ondansetron (ZOFRAN) 4 MG tablet TAKE 1 TABLET BY MOUTH EVERY 6 HOURS 02/06/17   Burns, Bobette Mo, MD  ?tiZANidine (ZANAFLEX) 4 MG tablet Take 1 tablet (4 mg total) by mouth at bedtime. 10/19/20   Wallis Bamberg, PA-C  ? ? ?Physical Exam: ?Vitals:  ? 06/12/21 0430 06/12/21 0645 06/12/21 0730 06/12/21 0800  ?BP: 114/67 121/78 117/79 121/85  ?Pulse: (!) 108 95 97   ?Resp: 20 14 13 18   ?Temp:   98.6 ?F (37 ?C)   ?TempSrc:   Oral   ?SpO2: 98% 98% 97%   ?Weight:      ?Height:      ? ?General: 46 y.o. female resting in bed in NAD ?Eyes: PERRL, normal sclera ?ENMT: Nares patent w/o discharge, orophaynx clear, dentition normal, ears w/o discharge/lesions/ulcers ?Neck: Supple, trachea midline ?Cardiovascular: RRR, +S1, S2, no m/g/r, equal pulses throughout ?Respiratory: CTABL, no w/r/r, normal WOB ?GI: BS+, NDNT, no masses noted, no organomegaly noted ?MSK: No e/c/c; left cellulitis and induration no current drainage; however there is an area that appear open ?Neuro: A&O x 3, no focal deficits ?Psyc: Appropriate interaction and affect, calm/cooperative  ? ?Data Reviewed: ? ?Na+ 134 ?Cl- 91 ?CO2 18 ?Gluco 646 ?Anion gap 25 ?Lactic acid 1.8 ?WBC  12.3 ? ?Ct pelvis w/ con ?1. Marked severity  cellulitis involving the left gluteal region and posterior aspect of the proximal left thigh. ? ?Assessment and Plan: ?No notes have been filed under this hospital service. ?Service: Hospitalist ?DKA ?DM2 uncontrolled ?    - placed in obs, SDU, convert to inpt ?    - continue Endotool ?    - follow A1c ?    - DM coordinator consult ?    - will need to switch to NPH at time for SQ conversion as she is unable to obtain insulin d/t insurance ?    - may have non-caloric  fluids ?  ?Left gluteal cellulitis ?    - currently on vanc, rocephin; with the location of that infection, I think we should add flagyl.  ?    - follow bld Cx ? ?HTN ?    - continue home regimen when confirmed ? ?HLD ?    - continue home regimen when confirmed ? ?Anxiety ?    - continue home regimen when confirmed ? ? Advance Care Planning:   Code Status: FULL  ? ?Consults: None ? ?Family Communication: w/ family at bedside ? ?Severity of Illness: ?The appropriate patient status for this patient is INPATIENT. Inpatient status is judged to be reasonable and necessary in order to provide the required intensity of service to ensure the patient's safety. The patient's presenting symptoms, physical exam findings, and initial radiographic and laboratory data in the context of their chronic comorbidities is felt to place them at high risk for further clinical deterioration. Furthermore, it is not anticipated that the patient will be medically stable for discharge from the hospital within 2 midnights of admission.  ? ?* I certify that at the point of admission it is my clinical judgment that the patient will require inpatient hospital care spanning beyond 2 midnights from the point of admission due to high intensity of service, high risk for further deterioration and high frequency of surveillance required.* ? ?Author: ?Teddy Spikeyrone A Evander Macaraeg, DO ?06/12/2021 9:11 AM ? ?For on call review [email or web address].  ?

## 2021-06-12 NOTE — ED Notes (Signed)
Lab called and agreed to add on urine pregnancy and urine culture.  ?

## 2021-06-13 DIAGNOSIS — L03317 Cellulitis of buttock: Secondary | ICD-10-CM

## 2021-06-13 DIAGNOSIS — F419 Anxiety disorder, unspecified: Secondary | ICD-10-CM

## 2021-06-13 DIAGNOSIS — E785 Hyperlipidemia, unspecified: Secondary | ICD-10-CM

## 2021-06-13 DIAGNOSIS — E1110 Type 2 diabetes mellitus with ketoacidosis without coma: Secondary | ICD-10-CM | POA: Diagnosis not present

## 2021-06-13 DIAGNOSIS — I10 Essential (primary) hypertension: Secondary | ICD-10-CM

## 2021-06-13 LAB — BETA-HYDROXYBUTYRIC ACID
Beta-Hydroxybutyric Acid: 0.52 mmol/L — ABNORMAL HIGH (ref 0.05–0.27)
Beta-Hydroxybutyric Acid: 0.53 mmol/L — ABNORMAL HIGH (ref 0.05–0.27)
Beta-Hydroxybutyric Acid: 2.79 mmol/L — ABNORMAL HIGH (ref 0.05–0.27)

## 2021-06-13 LAB — BASIC METABOLIC PANEL
Anion gap: 5 (ref 5–15)
Anion gap: 6 (ref 5–15)
BUN: 7 mg/dL (ref 6–20)
BUN: 6 mg/dL (ref 6–20)
CO2: 21 mmol/L — ABNORMAL LOW (ref 22–32)
CO2: 21 mmol/L — ABNORMAL LOW (ref 22–32)
Calcium: 8 mg/dL — ABNORMAL LOW (ref 8.9–10.3)
Calcium: 8 mg/dL — ABNORMAL LOW (ref 8.9–10.3)
Chloride: 112 mmol/L — ABNORMAL HIGH (ref 98–111)
Chloride: 109 mmol/L (ref 98–111)
Creatinine, Ser: 0.41 mg/dL — ABNORMAL LOW (ref 0.44–1.00)
Creatinine, Ser: 0.49 mg/dL (ref 0.44–1.00)
GFR, Estimated: >60 mL/min (ref >60)
GFR, Estimated: >60 mL/min (ref >60)
Glucose, Bld: 172 mg/dL — ABNORMAL HIGH (ref 70–99)
Glucose, Bld: 188 mg/dL — ABNORMAL HIGH (ref 70–99)
Potassium: 3.4 mmol/L — ABNORMAL LOW (ref 3.5–5.1)
Potassium: 3.9 mmol/L (ref 3.5–5.1)
Sodium: 138 mmol/L (ref 135–145)
Sodium: 136 mmol/L (ref 135–145)

## 2021-06-13 LAB — COMPREHENSIVE METABOLIC PANEL
ALT: 10 U/L (ref 0–44)
AST: 9 U/L — ABNORMAL LOW (ref 15–41)
Albumin: 2 g/dL — ABNORMAL LOW (ref 3.5–5.0)
Alkaline Phosphatase: 63 U/L (ref 38–126)
Anion gap: 5 (ref 5–15)
BUN: 7 mg/dL (ref 6–20)
CO2: 21 mmol/L — ABNORMAL LOW (ref 22–32)
Calcium: 8.1 mg/dL — ABNORMAL LOW (ref 8.9–10.3)
Chloride: 112 mmol/L — ABNORMAL HIGH (ref 98–111)
Creatinine, Ser: 0.42 mg/dL — ABNORMAL LOW (ref 0.44–1.00)
GFR, Estimated: >60 mL/min (ref >60)
Glucose, Bld: 174 mg/dL — ABNORMAL HIGH (ref 70–99)
Potassium: 3.4 mmol/L — ABNORMAL LOW (ref 3.5–5.1)
Sodium: 138 mmol/L (ref 135–145)
Total Bilirubin: 0.7 mg/dL (ref 0.3–1.2)
Total Protein: 5.7 g/dL — ABNORMAL LOW (ref 6.5–8.1)

## 2021-06-13 LAB — CBC
HCT: 37 % (ref 36.0–46.0)
Hemoglobin: 11.7 g/dL — ABNORMAL LOW (ref 12.0–15.0)
MCH: 30.9 pg (ref 26.0–34.0)
MCHC: 31.6 g/dL (ref 30.0–36.0)
MCV: 97.6 fL (ref 80.0–100.0)
Platelets: 264 10*3/uL (ref 150–400)
RBC: 3.79 MIL/uL — ABNORMAL LOW (ref 3.87–5.11)
RDW: 12.6 % (ref 11.5–15.5)
WBC: 10.2 10*3/uL (ref 4.0–10.5)
nRBC: 0 % (ref 0.0–0.2)

## 2021-06-13 LAB — GLUCOSE, CAPILLARY
Glucose-Capillary: 173 mg/dL — ABNORMAL HIGH (ref 70–99)
Glucose-Capillary: 141 mg/dL — ABNORMAL HIGH (ref 70–99)
Glucose-Capillary: 147 mg/dL — ABNORMAL HIGH (ref 70–99)
Glucose-Capillary: 175 mg/dL — ABNORMAL HIGH (ref 70–99)
Glucose-Capillary: 176 mg/dL — ABNORMAL HIGH (ref 70–99)
Glucose-Capillary: 199 mg/dL — ABNORMAL HIGH (ref 70–99)
Glucose-Capillary: 230 mg/dL — ABNORMAL HIGH (ref 70–99)
Glucose-Capillary: 209 mg/dL — ABNORMAL HIGH (ref 70–99)
Glucose-Capillary: 255 mg/dL — ABNORMAL HIGH (ref 70–99)

## 2021-06-13 LAB — HEMOGLOBIN A1C
Hgb A1c MFr Bld: >15.5 % — ABNORMAL HIGH (ref 4.8–5.6)
Mean Plasma Glucose: >398 mg/dL

## 2021-06-13 LAB — URINE CULTURE

## 2021-06-13 LAB — MRSA NEXT GEN BY PCR, NASAL: MRSA by PCR Next Gen: NOT DETECTED

## 2021-06-13 MED ORDER — ATORVASTATIN CALCIUM 10 MG PO TABS
20.0000 mg | ORAL_TABLET | Freq: Every day | ORAL | Status: DC
  Administered 2021-06-13 – 2021-06-14 (×2): 20 mg via ORAL
  Filled 2021-06-13 (×3): qty 2

## 2021-06-13 MED ORDER — ENSURE MAX PROTEIN PO LIQD
11.0000 [oz_av] | Freq: Every day | ORAL | Status: DC
  Administered 2021-06-13: 11 [oz_av] via ORAL
  Filled 2021-06-13 (×2): qty 330

## 2021-06-13 MED ORDER — INSULIN STARTER KIT- PEN NEEDLES (ENGLISH)
1.0000 | Freq: Once | Status: AC
  Administered 2021-06-13: 1
  Filled 2021-06-13: qty 1

## 2021-06-13 MED ORDER — POTASSIUM CHLORIDE 10 MEQ/100ML IV SOLN
10.0000 meq | INTRAVENOUS | Status: AC
  Administered 2021-06-13 (×2): 10 meq via INTRAVENOUS
  Filled 2021-06-13 (×2): qty 100

## 2021-06-13 MED ORDER — INSULIN ASPART 100 UNIT/ML IJ SOLN
0.0000 [IU] | Freq: Three times a day (TID) | INTRAMUSCULAR | Status: DC
  Administered 2021-06-13 – 2021-06-14 (×4): 5 [IU] via SUBCUTANEOUS

## 2021-06-13 MED ORDER — ADULT MULTIVITAMIN W/MINERALS CH
1.0000 | ORAL_TABLET | Freq: Every day | ORAL | Status: DC
  Administered 2021-06-13 – 2021-06-14 (×2): 1 via ORAL
  Filled 2021-06-13 (×2): qty 1

## 2021-06-13 MED ORDER — INSULIN DETEMIR 100 UNIT/ML ~~LOC~~ SOLN
10.0000 [IU] | Freq: Two times a day (BID) | SUBCUTANEOUS | Status: DC
  Administered 2021-06-13 (×2): 10 [IU] via SUBCUTANEOUS
  Filled 2021-06-13 (×3): qty 0.1

## 2021-06-13 MED ORDER — LIVING WELL WITH DIABETES BOOK
Freq: Once | Status: AC
  Filled 2021-06-13: qty 1

## 2021-06-13 NOTE — Discharge Instructions (Signed)

## 2021-06-13 NOTE — TOC Benefit Eligibility Note (Signed)
Transition of Care (TOC) Benefit Eligibility Note  ? ? ?Patient Details  ?Name: Najmah Carradine ?MRN: [id number] ?Date of Birth: 18-Dec-1975 ? ? ?Medication/Dose: Lantus Insulin 10 units 2 x day for 30 days is not on formulary preferred Rx is Basaglar ? ?Covered?: Yes ? ?  ? ?Prescription Coverage Preferred Pharmacy: local ? ?Spoke with Person/Company/Phone Number:: Amber/ [facility] [employer] [phone] ? ?Co-Pay: $62.00 ? ?  ? ?  ? ?  ? ? ? ?Caren Macadam ?Phone Number: ?06/13/2021, 3:31 PM ? ? ? ? ?

## 2021-06-13 NOTE — Progress Notes (Signed)
? ?PROGRESS NOTE ? ? ? ?Emily [name]  MPN:[id number] DOB: Feb 09, 1976 DOA: 06/11/2021 ?PCP: Verlon Au, MD ? ? ?Brief Narrative: ? ?Emily [name] is a 46 y.o. female with a history of diabetes mellitus, anxiety, hypertension, hyperlipidemia. Patient presented secondary to gluteal pain and found to have cellulitis with possible abscess, in addition to DKA. She was started on antibiotics and insulin drip for management. ? ? ?Assessment and Plan: ? ?DKA ?Patient managed on IV insulin with closure of anion gap metabolic acidosis and improvement of hyperglycemia. ?-Start Levemir 10 units BID with likely transition to 70/30 prior to discharge depending on patient ability to afford insulin ?-Discontinue IV insulin 2 hours post Levemir/SSI dosing ? ?Diabetes mellitus, type 2 ?Uncontrolled with hyperglycemia. Patient is on metformin and Evaristo Bury as an outpatient but has not been taking medications secondary to cost. ?-Management as mentioned above ? ?Left gluteal cellulitis ?Possible abscess although no focal collection noted on CT imaging. Wound with some purulent drainage. Patient started on empiric Vancomycin, Ceftriaxone and Flagyl. Blood cultures obtained. ?-Continue Vancomycin, Ceftriaxone and Flagyl ?-Local wound care ?-Follow-up blood cultures ?-Reevaluate for development of abscess vs continued improvement ?  ?Primary hypertension ?Patient is listed as taking metoprolol as an outpatient. Currently normotensive ? ?Hyperlipidemia ?-Continue Lipitor ? ?Anxiety ?Noted. Patient previously on Cymbalta but currently listed as not taking. ? ?Homelessness ?Poor access to healthcare ?-TOC consult ? ? ?DVT prophylaxis: Lovenox ?Code Status:   Code Status: Full Code ?Family Communication: Mother at bedside ?Disposition Plan: Discharge home likely in 1-3 days pending stable blood sugar ? ? ?Consultants:  ?None ? ?Procedures:  ?None ? ?Antimicrobials: ?Vancomycin ?Ceftriaxone ?Flagyl  ? ? ?Subjective: ?Patient  reports no issues overnight. Reports that she is living with her mom at this time. Low income. She has insurance but states it does not cover much. Concerned about her eyesight which has been worsening; she states she is supposed to have cataract surgery but cannot afford it at this time. ? ?Objective: ?BP 102/66   Pulse 81   Temp (!) 97.4 ?F (36.3 ?C) (Oral)   Resp 14   Ht 5\' 4"  (1.626 m)   Wt 61.2 kg   SpO2 97%   BMI 23.17 kg/m?  ? ?Examination: ? ?General exam: Appears calm and comfortable ?Respiratory system: Clear to auscultation. Respiratory effort normal. ?Cardiovascular system: S1 & S2 heard, RRR. No murmurs, rubs, gallops or clicks. ?Gastrointestinal system: Abdomen is nondistended, soft and nontender. No organomegaly or masses felt. Normal bowel sounds heard. ?Central nervous system: Alert and oriented. No focal neurological deficits. ?Musculoskeletal: No edema. No calf tenderness ?Skin: No cyanosis. Left aspect of gluteal cleft with significant induration and tenderness with some drainage noted ?Psychiatry: Judgement and insight appear normal. Mood & affect appropriate.  ? ? ?Data Reviewed: I have personally reviewed following labs and imaging studies ? ?CBC ?Lab Results  ?Component Value Date  ? WBC 10.2 06/13/2021  ? RBC 3.79 (L) 06/13/2021  ? HGB 11.7 (L) 06/13/2021  ? HCT 37.0 06/13/2021  ? MCV 97.6 06/13/2021  ? MCH 30.9 06/13/2021  ? PLT 264 06/13/2021  ? MCHC 31.6 06/13/2021  ? RDW 12.6 06/13/2021  ? LYMPHSABS 0.7 06/11/2021  ? MONOABS 0.7 06/11/2021  ? EOSABS 0.0 06/11/2021  ? BASOSABS 0.0 06/11/2021  ? ? ? ?Last metabolic panel ?Lab Results  ?Component Value Date  ? NA 138 06/13/2021  ? NA 138 06/13/2021  ? K 3.4 (L) 06/13/2021  ? K 3.4 (L) 06/13/2021  ? CL 112 (  H) 06/13/2021  ? CL 112 (H) 06/13/2021  ? CO2 21 (L) 06/13/2021  ? CO2 21 (L) 06/13/2021  ? BUN 7 06/13/2021  ? BUN 7 06/13/2021  ? CREATININE 0.41 (L) 06/13/2021  ? CREATININE 0.42 (L) 06/13/2021  ? GLUCOSE 172 (H) 06/13/2021  ?  GLUCOSE 174 (H) 06/13/2021  ? GFRNONAA >60 06/13/2021  ? GFRNONAA >60 06/13/2021  ? GFRAA >60 03/29/2016  ? CALCIUM 8.0 (L) 06/13/2021  ? CALCIUM 8.1 (L) 06/13/2021  ? PROT 5.7 (L) 06/13/2021  ? ALBUMIN 2.0 (L) 06/13/2021  ? BILITOT 0.7 06/13/2021  ? ALKPHOS 63 06/13/2021  ? AST 9 (L) 06/13/2021  ? ALT 10 06/13/2021  ? ANIONGAP 5 06/13/2021  ? ANIONGAP 5 06/13/2021  ? ? ?GFR: ?Estimated Creatinine Clearance: 76.7 mL/min (A) (by C-G formula based on SCr of 0.41 mg/dL (L)). ? ?Recent Results (from the past 240 hour(s))  ?MRSA Next Gen by PCR, Nasal     Status: None  ? Collection Time: 06/12/21 10:12 PM  ? Specimen: Nasal Mucosa; Nasal Swab  ?Result Value Ref Range Status  ? MRSA by PCR Next Gen NOT DETECTED NOT DETECTED Final  ?  Comment: (NOTE) ?The GeneXpert MRSA Assay (FDA approved for NASAL specimens only), ?is one component of a comprehensive MRSA colonization surveillance ?program. It is not intended to diagnose MRSA infection nor to guide ?or monitor treatment for MRSA infections. ?Test performance is not FDA approved in patients less than 2 years ?old. ?Performed at [facility], [address] ?[place], [place] [address] ?  ?  ? ? ?Radiology Studies: ?CT PELVIS W CONTRAST ? ?Result Date: 06/12/2021 ?CLINICAL DATA:  Evaluate left buttock abscess. EXAM: CT PELVIS WITH CONTRAST TECHNIQUE: Multidetector CT imaging of the pelvis was performed using the standard protocol following the bolus administration of intravenous contrast. RADIATION DOSE REDUCTION: This exam was performed according to the departmental dose-optimization program which includes automated exposure control, adjustment of the mA and/or kV according to patient size and/or use of iterative reconstruction technique. CONTRAST:  OMNIPAQUE IOHEXOL 300 MG/ML  SOLN COMPARISON:  None. FINDINGS: Urinary Tract: Subcentimeter bilateral parapelvic renal cysts are seen with mild to moderate severity bilateral hydroureter. The urinary  bladder is markedly distended. Bowel:  Unremarkable visualized pelvic bowel loops. Vascular/Lymphatic: No pathologically enlarged lymph nodes. No significant vascular abnormality seen. Reproductive:  The uterus and bilateral adnexa are unremarkable. Other:  None. Musculoskeletal: Marked severity subcutaneous and para muscular inflammatory fat stranding and edema is seen throughout the gluteal region on the left. This extends along the posterior aspect of the proximal left thigh (incompletely imaged). No organized fluid collection or abscess is identified. IMPRESSION: 1. Marked severity cellulitis involving the left gluteal region and posterior aspect of the proximal left thigh. Electronically Signed   By: Aram Candela M.D.   On: 06/12/2021 02:25   ? ? ? LOS: 0 days  ? ? ?Jacquelin Hawking, MD ?[facility] ?06/13/2021, 8:25 AM ? ? ?If 7PM-7AM, please contact night-coverage [email or web address] ? ?

## 2021-06-13 NOTE — TOC Progression Note (Signed)
Transition of Care (TOC) - Progression Note  ? ? ?Patient Details  ?Name: Emily [name] ?MRN: [id number] ?Date of Birth: 1975-03-19 ? ?Transition of Care (TOC) CM/SW Contact  ?Geni Bers, RN ?Phone Number: ?06/13/2021, 3:19 PM ? ?Clinical Narrative:    ? ?Spoke with pt concerning Medications. Pt has [employer], and an PCP appointment in May. Unable to assist pt with medications or to change insurance. Pt was encouraged to to back to [employer] to change insurance.  ? ?Expected Discharge Plan: Home/Self Care ?Barriers to Discharge: No Barriers Identified ? ?Expected Discharge Plan and Services ?Expected Discharge Plan: Home/Self Care ?  ?  ?  ?Living arrangements for the past 2 months: Single Family Home ?                ?  ?  ?  ?  ?  ?  ?  ?  ?  ?  ? ? ?Social Determinants of Health (SDOH) Interventions ?  ? ?Readmission Risk Interventions ?   ? View : No data to display.  ?  ?  ?  ? ? ?

## 2021-06-13 NOTE — Progress Notes (Signed)
Inpatient Diabetes Program Recommendations ? ?AACE/ADA: New Consensus Statement on Inpatient Glycemic Control (2015) ? ?Target Ranges:  Prepandial:   less than 140 mg/dL ?     Peak postprandial:   less than 180 mg/dL (1-2 hours) ?     Critically ill patients:  140 - 180 mg/dL  ? ?Lab Results  ?Component Value Date  ? GLUCAP 230 (H) 06/13/2021  ? HGBA1C >15.5 (H) 06/12/2021  ? ? ?Review of Glycemic Control ? Latest Reference Range & Units 06/13/21 03:37 06/13/21 05:43 06/13/21 07:43 06/13/21 09:43 06/13/21 11:48 06/13/21 12:51  ?Glucose-Capillary 70 - 99 mg/dL 354 (H) 656 (H) 812 (H) 176 (H) 199 (H) 230 (H)  ? ?Diabetes history: DM 2 ?Outpatient Diabetes medications:  ?Tresiba 28 units daily (patient has not taken recently) ?Metformin 1000 mg bid ?Current orders for Inpatient glycemic control:  ?Novolog 0-15 units tid with meals ?Levemir 10 units bid ? ?Inpatient Diabetes Program Recommendations:   ? ?Spoke with patient and her mother in detail.  Patient states that she has not been taking her insulin as ordered.  Cost and high deductible insurance plan has been prohibitive in her care.  Discussed current A1C of >15% and explained that she has to get control of her DM.  She states that she cannot see due to cataracts and has not been able to afford to have them removed.  Explained that high blood sugars can also affect vision.  Reviewed importance of getting sugars under control .  She verbalized understanding.  Also discussed the use of co-pay cards to help with out of pocket cost.  She is hoping that [facility] can help her as well.  She feels comfortable using insulin pens and knows that she must take insulin as ordered.  Will follow.  ? ?Thanks,  ?Beryl Meager, RN, BC-ADM ?Inpatient Diabetes Coordinator ?Pager [phone]  (8a-5p) ? ?

## 2021-06-13 NOTE — Progress Notes (Addendum)
Initial Nutrition Assessment ? ?DOCUMENTATION CODES:  ? ?Non-severe (moderate) malnutrition in context of chronic illness ? ?INTERVENTION:  ?- will order Ensure Max once/day, each supplement provides 150 kcal and 30 grams of protein. ?- will order 1 tablet multivitamin with minerals daily. ?- place Carbohydrate Counting for People with Diabetes handout from the Academy of Nutrition and Dietetics in AVS. ?- will place referral for Nutrition and Diabetes Education Services (NDES). ? ? ?NUTRITION DIAGNOSIS:  ? ?Moderate Malnutrition related to chronic illness (suspected uncontrolled DM) as evidenced by mild fat depletion, mild muscle depletion, moderate muscle depletion. ? ?GOAL:  ? ?Patient will meet greater than or equal to 90% of their needs ? ?MONITOR:  ? ?PO intake, Supplement acceptance, Labs, Weight trends, Skin ? ?REASON FOR ASSESSMENT:  ? ?Malnutrition Screening Tool ? ?ASSESSMENT:  ? ?46 y.o. female with medical history of type 2 DM, anxiety, depression, GERD, HTN, and HLD. She presented to the ED due to L gluteal pain which began 4 days prior. She was experiencing nausea but no fever, vomiting, or diarrhea. She was admitted for DKA. ? ?Patient laying in bed with mom at bedside. Patient shares that PTA she was not monitor anything diet-related and was eating anything she wanted and not monitoring portions. She shares that she does not check CBGs and has not for a prolonged period. She was frequently drinking things such as iced tea, lemonade, and pink lemonade.  ? ?Patient and mom discuss poor eyesight d/t cataracts and severity of wound (and associated pain) to inner butt cheeks. ? ?Patients does not read nutrition labels but is agreeable to beginning to use these as a tool. She also plans to buy a plate that is divided into sections to aid in portion control. ? ?She is receptive to referral for NDES to receive ongoing DM diet-related education and guidance after d/c.  ? ?She is motivated to begin making  better food choices to aid in glycemic control in an effort to feel better and to remain out of the hospital.  ? ?Weight on 4/16 was documented as 135 lb which appears to be a stated weight, but unable to confirm. PTA the most recently documented weight was 168 lb on 05/10/21 which would indicate 33 lb weight loss (20% body weight) in the past 1 month.  ? ? ?Labs reviewed; HgbA1c: >15.5%, CBGs: 141-230 mg/dl, K: 3.4 mmol/l, Cl: 124 mmol/l, creatinine: 0.41 mg/dl. ? ?Medications reviewed; sliding scale novolog, 10 units levemir BID, 10 mEq IV KCl x4 runs 4/17 and x2 runs 4/18. ? ?IVF; D5-LR @ 125 ml/hr (510 kcal/24 hrs). ?  ? ?NUTRITION - FOCUSED PHYSICAL EXAM: ? ?Flowsheet Row Most Recent Value  ?Orbital Region Mild depletion  ?Upper Arm Region Moderate depletion  ?Thoracic and Lumbar Region Unable to assess  ?Buccal Region Mild depletion  ?Temple Region No depletion  ?Clavicle Bone Region Moderate depletion  ?Clavicle and Acromion Bone Region Mild depletion  ?Scapular Bone Region No depletion  ?Dorsal Hand Mild depletion  ?Patellar Region Mild depletion  ?Anterior Thigh Region Mild depletion  ?Posterior Calf Region Mild depletion  ?Edema (RD Assessment) None  ?Hair Unable to assess  ?Eyes Reviewed  ?Mouth Reviewed  ?Skin Reviewed  ?Nails Unable to assess  ? ?  ? ? ?Diet Order:   ?Diet Order   ? ?       ?  Diet Carb Modified Fluid consistency: Thin; Room service appropriate? Yes  Diet effective now       ?  ? ?  ?  ? ?  ? ? ?  EDUCATION NEEDS:  ? ?Education needs have been addressed ? ?Skin:  Skin Assessment: Reviewed RN Assessment ? ?Last BM:  PTA/unknown ? ?Height:  ? ?Ht Readings from Last 1 Encounters:  ?06/11/21 5\' 4"  (1.626 m)  ? ? ?Weight:  ? ?Wt Readings from Last 1 Encounters:  ?06/11/21 61.2 kg  ? ? ? ?BMI:  Body mass index is 23.17 kg/m?. ? ?Estimated Nutritional Needs:  ?Kcal:  1850-2050 kcal ?Protein:  95-110 grams ?Fluid:  >/= 2.2 L/day ? ? ? ? ?06/13/21, MS, RD, LDN ?Registered Dietitian  II ?Inpatient Clinical Nutrition ?RD pager # and on-call/weekend pager # available in AMION  ? ?

## 2021-06-13 NOTE — Consult Note (Signed)
WOC Nurse Consult Note: ?Reason for Consult: gluteal abscess  ?Wound type: abscess related to DM  ?Pressure Injury POA: NA ?Measurement:no open wound ?Wound bed: no wound  ?Drainage (amount, consistency, odor) tan, purulent, with odor ?Periwound: intact  ?Dressing procedure/placement/frequency: ?Discussed with patient's bedside nursing, no real open wound. Hard nodules draining. No real wound to pack; will use ABD for absorption.  Discussed other options with bedside nursing (foam,alginate) due to location we decided to go with ABD pads, no need at this time for periwound skin protection.  ? ? ?Re consult if needed, will not follow at this time. ?Thanks ? Dontae Minerva [facility] MSN, RN,CWOCN, CNS, CWON-AP [phone])   ? ? ? ?  ?

## 2021-06-14 DIAGNOSIS — E1110 Type 2 diabetes mellitus with ketoacidosis without coma: Secondary | ICD-10-CM | POA: Diagnosis not present

## 2021-06-14 DIAGNOSIS — E785 Hyperlipidemia, unspecified: Secondary | ICD-10-CM

## 2021-06-14 DIAGNOSIS — I10 Essential (primary) hypertension: Secondary | ICD-10-CM | POA: Diagnosis not present

## 2021-06-14 DIAGNOSIS — L03317 Cellulitis of buttock: Secondary | ICD-10-CM

## 2021-06-14 LAB — BASIC METABOLIC PANEL
Anion gap: 6 (ref 5–15)
BUN: 11 mg/dL (ref 6–20)
CO2: 23 mmol/L (ref 22–32)
Calcium: 8.4 mg/dL — ABNORMAL LOW (ref 8.9–10.3)
Chloride: 108 mmol/L (ref 98–111)
Creatinine, Ser: 0.46 mg/dL (ref 0.44–1.00)
GFR, Estimated: >60 mL/min (ref >60)
Glucose, Bld: 251 mg/dL — ABNORMAL HIGH (ref 70–99)
Potassium: 3.6 mmol/L (ref 3.5–5.1)
Sodium: 137 mmol/L (ref 135–145)

## 2021-06-14 LAB — CBC
HCT: 38.3 % (ref 36.0–46.0)
Hemoglobin: 12 g/dL (ref 12.0–15.0)
MCH: 29.7 pg (ref 26.0–34.0)
MCHC: 31.3 g/dL (ref 30.0–36.0)
MCV: 94.8 fL (ref 80.0–100.0)
Platelets: 291 10*3/uL (ref 150–400)
RBC: 4.04 MIL/uL (ref 3.87–5.11)
RDW: 12.7 % (ref 11.5–15.5)
WBC: 10.4 10*3/uL (ref 4.0–10.5)
nRBC: 0 % (ref 0.0–0.2)

## 2021-06-14 LAB — GLUCOSE, CAPILLARY
Glucose-Capillary: 238 mg/dL — ABNORMAL HIGH (ref 70–99)
Glucose-Capillary: 216 mg/dL — ABNORMAL HIGH (ref 70–99)

## 2021-06-14 LAB — HEMOGLOBIN A1C
Hgb A1c MFr Bld: >15.5 % — ABNORMAL HIGH (ref 4.8–5.6)
Mean Plasma Glucose: >398 mg/dL

## 2021-06-14 MED ORDER — BLOOD GLUCOSE METER KIT: PACK | 0 refills | Status: AC

## 2021-06-14 MED ORDER — BASAGLAR KWIKPEN 100 UNIT/ML ~~LOC~~ SOPN: 26.0000 [IU] | PEN_INJECTOR | Freq: Every day | SUBCUTANEOUS | 0 refills | Status: AC

## 2021-06-14 MED ORDER — METFORMIN HCL 1000 MG PO TABS
1000.0000 mg | ORAL_TABLET | Freq: Two times a day (BID) | ORAL | 0 refills | Status: AC
Start: 2021-06-14 — End: ?

## 2021-06-14 MED ORDER — AMOXICILLIN-POT CLAVULANATE 875-125 MG PO TABS: 1.0000 | ORAL_TABLET | Freq: Two times a day (BID) | ORAL | 0 refills | Status: AC

## 2021-06-14 MED ORDER — DOXYCYCLINE HYCLATE 100 MG PO TABS: 100.0000 mg | ORAL_TABLET | Freq: Two times a day (BID) | ORAL | 0 refills | Status: AC

## 2021-06-14 MED ORDER — OXYCODONE HCL 5 MG PO TABS
5.0000 mg | ORAL_TABLET | Freq: Four times a day (QID) | ORAL | 0 refills | Status: DC | PRN
Start: 2021-06-14 — End: 2021-10-17

## 2021-06-14 MED ORDER — PEN NEEDLES 31G X 5 MM MISC: 0 refills | Status: AC

## 2021-06-14 MED ORDER — INSULIN DETEMIR 100 UNIT/ML ~~LOC~~ SOLN
15.0000 [IU] | Freq: Two times a day (BID) | SUBCUTANEOUS | Status: DC
  Administered 2021-06-14: 15 [IU] via SUBCUTANEOUS
  Filled 2021-06-14 (×2): qty 0.15

## 2021-06-14 MED ORDER — SODIUM CHLORIDE 0.9 % IV SOLN: INTRAVENOUS | Status: DC | PRN

## 2021-06-14 MED ORDER — GLIMEPIRIDE 2 MG PO TABS: 2.0000 mg | ORAL_TABLET | ORAL | 0 refills | Status: AC

## 2021-06-14 NOTE — Plan of Care (Signed)

## 2021-06-14 NOTE — Discharge Summary (Signed)
Physician Discharge Summary  ?Emily [name] WYO:[id number] DOB: 1975/10/18 DOA: 06/11/2021 ? ?PCP: Bartholome Bill, MD ? ?Admit date: 06/11/2021 ?Discharge date: 06/14/2021 ? ?Admitted From: Home ?Disposition: Home ? ?Recommendations for Outpatient Follow-up:  ?Follow up with PCP in 1 week with repeat CBC/BMP ?Outpatient follow-up with general surgery if needed ?Comply with medications and follow-up ?Follow up in ED if symptoms worsen or new appear ? ? ?Home Health: No ?Equipment/Devices: None ? ?Discharge Condition: Stable ?CODE STATUS: Full ?Diet recommendation: Carb modified/heart healthy ? ?Brief/Interim Summary: ?46 y.o. female with a history of diabetes mellitus, anxiety, hypertension, hyperlipidemia presented with gluteal pain and was found to have cellulitis with possible abscess along with DKA.  She was started on IV fluids, insulin drip and IV antibiotics.  During the hospitalization, her blood sugars improved and she was transitioned to long-acting insulin.  She is currently hemodynamically stable and feels okay to go home.  She will be discharged home today on oral antibiotics along with long-acting insulin and or medications for diabetes.  She needs to be compliant with medications and follow-up. ? ?Discharge Diagnoses:  ? ?DKA in a patient with diabetes mellitus type II with hyperglycemia ?-A1c more than 15 ?-Treated with IV fluids and insulin drip and subsequently transitioned to long-acting insulin.  Currently tolerating diet.  Diabetes coordinator following.  She needs to be compliant with medications and follow-up. ?-Discharge patient home on insulin Basaglar 26 units daily along with oral metformin 1000 mg twice a day and Amaryl 2 mg daily.  Close outpatient follow-up with PCP. ? ?Gluteal cellulitis with possible abscess ?-No focal collection noted on CT imaging.  Wound is draining on its own.  Continue wound care as per wound care consult recommendations. ?-Currently on IV antibiotics.   Discharge home on oral Augmentin and doxycycline for a week.  Outpatient follow-up with PCP.  Might need surgery evaluation if symptoms do not improve. ? ?Hypertension ?-Continue metoprolol.  Outpatient follow-up ? ?Hyperlipidemia ?-Continue Lipitor ? ?Anxiety ?-Apparently currently not taking Cymbalta.  Outpatient follow-up with PCP ?Discharge Instructions ? ?Discharge Instructions   ? ? Amb Referral to Nutrition and Diabetic Education   Complete by: As directed ?  ? Diet Carb Modified   Complete by: As directed ?  ? Discharge wound care:   Complete by: As directed ?  ? Use ABD pads to absorb drainage in gluteal skin fold, change PRN and at last BID  ? Increase activity slowly   Complete by: As directed ?  ? ?  ? ?Allergies as of 06/14/2021   ? ?   Reactions  ? Methocarbamol Other (See Comments)  ? "Caused sores inside mouth"  ? Zolpidem Tartrate Other (See Comments)  ? Hallucinations lasting almost 24 hrs  ? ?  ? ?  ?Medication List  ?  ? ?STOP taking these medications   ? ?Tyler Aas FlexTouch 100 UNIT/ML FlexTouch Pen ?Generic drug: insulin degludec ?  ? ?  ? ?TAKE these medications   ? ?albuterol 108 (90 Base) MCG/ACT inhaler ?Commonly known as: VENTOLIN HFA ?Inhale 2 puffs into the lungs every 4 (four) hours as needed for wheezing. ?  ?amoxicillin-clavulanate 875-125 MG tablet ?Commonly known as: Augmentin ?Take 1 tablet by mouth 2 (two) times daily for 7 days. ?  ?aspirin EC 81 MG tablet ?Take 81 mg by mouth daily. Swallow whole. ?  ?atorvastatin 20 MG tablet ?Commonly known as: LIPITOR ?TAKE 1 TABLET BY MOUTH EVERY DAY ?  ?Basaglar KwikPen 100 UNIT/ML ?Inject 26 Units into  the skin daily. ?  ?blood glucose meter kit and supplies ?Dispense based on patient and insurance preference. Use up to four times daily as directed. (FOR ICD-10 E10.9, E11.9). ?  ?doxycycline 100 MG tablet ?Commonly known as: VIBRA-TABS ?Take 1 tablet (100 mg total) by mouth 2 (two) times daily for 7 days. ?  ?DULoxetine 60 MG  capsule ?Commonly known as: Cymbalta ?Take 1 capsule (60 mg total) by mouth daily. ?  ?FISH OIL PO ?Take 1 capsule by mouth daily. ?  ?gabapentin 400 MG capsule ?Commonly known as: NEURONTIN ?Take 400 mg by mouth 2 (two) times daily. ?  ?glimepiride 2 MG tablet ?Commonly known as: Amaryl ?Take 1 tablet (2 mg total) by mouth every morning. ?  ?ibuprofen 600 MG tablet ?Commonly known as: ADVIL ?Take 1 tablet (600 mg total) by mouth every 6 (six) hours as needed. Take with food. Drink plenty of fluids/stay well hydrated. ?What changed: reasons to take this ?  ?loperamide 2 MG capsule ?Commonly known as: IMODIUM ?Take 1 capsule (2 mg total) by mouth 4 (four) times daily as needed for diarrhea or loose stools. ?  ?meclizine 25 MG tablet ?Commonly known as: ANTIVERT ?Take 1 tablet (25 mg total) by mouth 3 (three) times daily as needed for dizziness. ?  ?metFORMIN 1000 MG tablet ?Commonly known as: GLUCOPHAGE ?Take 1 tablet (1,000 mg total) by mouth in the morning and at bedtime. ?  ?metoprolol tartrate 25 MG tablet ?Commonly known as: LOPRESSOR ?TAKE 1 TABLET BY MOUTH EVERY DAY ?  ?oxyCODONE 5 MG immediate release tablet ?Commonly known as: Oxy IR/ROXICODONE ?Take 1 tablet (5 mg total) by mouth every 6 (six) hours as needed for severe pain. ?  ?Pen Needles 31G X 5 MM Misc ?Basaglar 26 units daily ?  ?prednisoLONE acetate 1 % ophthalmic suspension ?Commonly known as: PRED FORTE ?Place 1 drop into the left eye in the morning and at bedtime. ?  ?PREPARATION H RE ?Place 1 application. rectally 3 (three) times daily as needed (irritation/hemorrhoids). ?  ?VITAMIN C PO ?Take 1 tablet by mouth daily. ?  ? ?  ? ?  ?  ? ? ?  ?Discharge Care Instructions  ?(From admission, onward)  ?  ? ? ?  ? ?  Start     Ordered  ? 06/14/21 0000  Discharge wound care:       ?Comments: Use ABD pads to absorb drainage in gluteal skin fold, change PRN and at last BID  ? 06/14/21 1129  ? ?  ?  ? ?  ? ? Follow-up Information   ? ? Bartholome Bill, MD. Schedule an appointment as soon as possible for a visit in 1 week(s).   ?Specialty: Family Medicine ?Contact information: ?[address] ?[address] ?[place] [address] ?[phone] ? ? ?  ?  ? ?  ?  ? ?  ? ?Allergies  ?Allergen Reactions  ? Methocarbamol Other (See Comments)  ?  "Caused sores inside mouth"  ? Zolpidem Tartrate Other (See Comments)  ?  Hallucinations lasting almost 24 hrs  ? ? ?Consultations: ?None ? ? ?Procedures/Studies: ?CT PELVIS W CONTRAST ? ?Result Date: 06/12/2021 ?CLINICAL DATA:  Evaluate left buttock abscess. EXAM: CT PELVIS WITH CONTRAST TECHNIQUE: Multidetector CT imaging of the pelvis was performed using the standard protocol following the bolus administration of intravenous contrast. RADIATION DOSE REDUCTION: This exam was performed according to the departmental dose-optimization program which includes automated exposure control, adjustment of the mA and/or  kV according to patient size and/or use of iterative reconstruction technique. CONTRAST:  137mL OMNIPAQUE IOHEXOL 300 MG/ML  SOLN COMPARISON:  None. FINDINGS: Urinary Tract: Subcentimeter bilateral parapelvic renal cysts are seen with mild to moderate severity bilateral hydroureter. The urinary bladder is markedly distended. Bowel:  Unremarkable visualized pelvic bowel loops. Vascular/Lymphatic: No pathologically enlarged lymph nodes. No significant vascular abnormality seen. Reproductive:  The uterus and bilateral adnexa are unremarkable. Other:  None. Musculoskeletal: Marked severity subcutaneous and para muscular inflammatory fat stranding and edema is seen throughout the gluteal region on the left. This extends along the posterior aspect of the proximal left thigh (incompletely imaged). No organized fluid collection or abscess is identified. IMPRESSION: 1. Marked severity cellulitis involving the left gluteal region and posterior aspect of the proximal left thigh. Electronically Signed   By: Virgina Norfolk M.D.   On: 06/12/2021 02:25   ? ? ? ?Subjective: ?Patient seen and examined at bedside.  Mother present at bedside.  Feels okay to go home today.  Tolerating diet.  No overnight fever or vomiting reported. ? ?Discharge Exam:

## 2021-06-14 NOTE — TOC Progression Note (Signed)
Transition of Care (TOC) - Progression Note  ? ? ?Patient Details  ?Name: Emily [name] ?MRN: [id number] ?Date of Birth: 22-Dec-1975 ? ?Transition of Care (TOC) CM/SW Contact  ?Geni Bers, RN ?Phone Number: ?06/14/2021, 10:11 AM ? ?Clinical Narrative:    ? ?Spoke with pt concerning housing. Pt live with her mother and son. Pt will need to contact Housing authority to help her and her son with housing. This information was given to pt.  ? ?Expected Discharge Plan: Home/Self Care ?Barriers to Discharge: No Barriers Identified ? ?Expected Discharge Plan and Services ?Expected Discharge Plan: Home/Self Care ?  ?  ?  ?Living arrangements for the past 2 months: Single Family Home ?                ?  ?  ?  ?  ?  ?  ?  ?  ?  ?  ? ? ?Social Determinants of Health (SDOH) Interventions ?  ? ?Readmission Risk Interventions ?   ? View : No data to display.  ?  ?  ?  ? ? ?

## 2021-06-14 NOTE — Plan of Care (Signed)
?  Problem: Education: ?Goal: Knowledge of General Education information will improve ?Description: Including pain rating scale, medication(s)/side effects and non-pharmacologic comfort measures ?06/14/2021 1238 by Armando Reichert, RN ?Outcome: Adequate for Discharge ?06/14/2021 1154 by Armando Reichert, RN ?Outcome: Progressing ?  ?Problem: Health Behavior/Discharge Planning: ?Goal: Ability to manage health-related needs will improve ?06/14/2021 1238 by Armando Reichert, RN ?Outcome: Adequate for Discharge ?06/14/2021 1154 by Armando Reichert, RN ?Outcome: Progressing ?  ?Problem: Clinical Measurements: ?Goal: Ability to maintain clinical measurements within normal limits will improve ?06/14/2021 1238 by Armando Reichert, RN ?Outcome: Adequate for Discharge ?06/14/2021 1154 by Armando Reichert, RN ?Outcome: Progressing ?Goal: Will remain free from infection ?06/14/2021 1238 by Armando Reichert, RN ?Outcome: Adequate for Discharge ?06/14/2021 1154 by Armando Reichert, RN ?Outcome: Progressing ?Goal: Diagnostic test results will improve ?06/14/2021 1238 by Armando Reichert, RN ?Outcome: Adequate for Discharge ?06/14/2021 1154 by Armando Reichert, RN ?Outcome: Progressing ?Goal: Respiratory complications will improve ?06/14/2021 1238 by Armando Reichert, RN ?Outcome: Adequate for Discharge ?06/14/2021 1154 by Armando Reichert, RN ?Outcome: Progressing ?Goal: Cardiovascular complication will be avoided ?06/14/2021 1238 by Armando Reichert, RN ?Outcome: Adequate for Discharge ?06/14/2021 1154 by Armando Reichert, RN ?Outcome: Progressing ?  ?Problem: Activity: ?Goal: Risk for activity intolerance will decrease ?06/14/2021 1238 by Armando Reichert, RN ?Outcome: Adequate for Discharge ?06/14/2021 1154 by Armando Reichert, RN ?Outcome: Progressing ?  ?Problem: Nutrition: ?Goal: Adequate nutrition will be maintained ?06/14/2021 1238 by Armando Reichert, RN ?Outcome: Adequate for Discharge ?06/14/2021 1154 by Armando Reichert, RN ?Outcome: Progressing ?  ?Problem: Coping: ?Goal: Level of anxiety will decrease ?06/14/2021 1238 by Armando Reichert, RN ?Outcome: Adequate for Discharge ?06/14/2021 1154 by Armando Reichert, RN ?Outcome: Progressing ?  ?Problem: Elimination: ?Goal: Will not experience complications related to bowel motility ?06/14/2021 1238 by Armando Reichert, RN ?Outcome: Adequate for Discharge ?06/14/2021 1154 by Armando Reichert, RN ?Outcome: Progressing ?Goal: Will not experience complications related to urinary retention ?06/14/2021 1238 by Armando Reichert, RN ?Outcome: Adequate for Discharge ?06/14/2021 1154 by Armando Reichert, RN ?Outcome: Progressing ?  ?Problem: Pain Managment: ?Goal: General experience of comfort will improve ?06/14/2021 1238 by Armando Reichert, RN ?Outcome: Adequate for Discharge ?06/14/2021 1154 by Armando Reichert, RN ?Outcome: Progressing ?  ?Problem: Safety: ?Goal: Ability to remain free from injury will improve ?06/14/2021 1238 by Armando Reichert, RN ?Outcome: Adequate for Discharge ?06/14/2021 1154 by Armando Reichert, RN ?Outcome: Progressing ?  ?Problem: Skin Integrity: ?Goal: Risk for impaired skin integrity will decrease ?06/14/2021 1238 by Armando Reichert, RN ?Outcome: Adequate for Discharge ?06/14/2021 1154 by Armando Reichert, RN ?Outcome: Progressing ?  ?

## 2021-06-14 NOTE — Progress Notes (Signed)
Inpatient Diabetes Program Recommendations ? ?AACE/ADA: New Consensus Statement on Inpatient Glycemic Control (2015) ? ?Target Ranges:  Prepandial:   less than 140 mg/dL ?     Peak postprandial:   less than 180 mg/dL (1-2 hours) ?     Critically ill patients:  140 - 180 mg/dL  ? ?Lab Results  ?Component Value Date  ? GLUCAP 238 (H) 06/14/2021  ? HGBA1C >15.5 (H) 06/12/2021  ? ? ?Review of Glycemic Control ? Latest Reference Range & Units 06/13/21 12:51 06/13/21 16:49 06/13/21 21:52 06/14/21 07:38  ?Glucose-Capillary 70 - 99 mg/dL 081 (H) 448 (H) 185 (H) 238 (H)  ? ?Diabetes history: DM 2 ?Outpatient Diabetes medications:  ?Tresiba 28 units daily (patient has not taken recently) ?Metformin 1000 mg bid ?Current orders for Inpatient glycemic control:  ?Novolog 0-15 units tid with meals ?Levemir 15 units bid ?  ?Inpatient Diabetes Program Recommendations:   ? ?Note plans for discharge today.  ?Recommend -Basaglar 26 units daily [phone]) ?  - insulin pen needles [phone]) ?  -Glucose meter/strips (#[id number]) ?                      -Metformin 1000 mg bid.   ?-Amaryl 2 mg daily ? She needs F/U with PCP ASAP - Finances are tight for her.  ? ?Thanks,  ?Beryl Meager, RN, BC-ADM ?Inpatient Diabetes Coordinator ?Pager [phone]  (8a-5p) ? ? ?

## 2021-06-17 LAB — CULTURE, BLOOD (ROUTINE X 2)
Culture: NO GROWTH
Culture: NO GROWTH
Special Requests: ADEQUATE

## 2021-07-17 ENCOUNTER — Other Ambulatory Visit: Payer: Self-pay | Admitting: Nurse Practitioner

## 2021-07-17 DIAGNOSIS — Z1231 Encounter for screening mammogram for malignant neoplasm of breast: Secondary | ICD-10-CM

## 2021-08-21 ENCOUNTER — Encounter: Payer: Self-pay | Admitting: Dietician

## 2021-08-21 ENCOUNTER — Encounter: Payer: Medicaid Other | Attending: Family Medicine | Admitting: Dietician

## 2021-08-21 DIAGNOSIS — E119 Type 2 diabetes mellitus without complications: Secondary | ICD-10-CM | POA: Insufficient documentation

## 2021-10-17 ENCOUNTER — Other Ambulatory Visit: Payer: Self-pay

## 2021-10-17 ENCOUNTER — Emergency Department (HOSPITAL_BASED_OUTPATIENT_CLINIC_OR_DEPARTMENT_OTHER)
Admission: EM | Admit: 2021-10-17 | Discharge: 2021-10-17 | Disposition: A | Discharge status: Home / Self Care | Payer: No Typology Code available for payment source | Attending: Emergency Medicine | Admitting: Emergency Medicine

## 2021-10-17 ENCOUNTER — Encounter (HOSPITAL_BASED_OUTPATIENT_CLINIC_OR_DEPARTMENT_OTHER): Payer: Self-pay | Admitting: Emergency Medicine

## 2021-10-17 DIAGNOSIS — B029 Zoster without complications: Secondary | ICD-10-CM | POA: Diagnosis present

## 2021-10-17 DIAGNOSIS — B0222 Postherpetic trigeminal neuralgia: Secondary | ICD-10-CM | POA: Diagnosis not present

## 2021-10-17 DIAGNOSIS — B0229 Other postherpetic nervous system involvement: Secondary | ICD-10-CM

## 2021-10-17 DIAGNOSIS — R7309 Other abnormal glucose: Secondary | ICD-10-CM | POA: Insufficient documentation

## 2021-10-17 DIAGNOSIS — Z7982 Long term (current) use of aspirin: Secondary | ICD-10-CM | POA: Insufficient documentation

## 2021-10-17 LAB — CBG MONITORING, ED: Glucose-Capillary: 162 mg/dL — ABNORMAL HIGH (ref 70–99)

## 2021-10-17 MED ORDER — HYDROCODONE-ACETAMINOPHEN 5-325 MG PO TABS: 1.0000 | ORAL_TABLET | Freq: Four times a day (QID) | ORAL | 0 refills | Status: AC | PRN

## 2021-10-17 MED ORDER — KETOROLAC TROMETHAMINE 30 MG/ML IJ SOLN
30.0000 mg | Freq: Once | INTRAMUSCULAR | Status: AC
Start: 2021-10-17 — End: 2021-10-17
  Administered 2021-10-17: 30 mg via INTRAMUSCULAR

## 2021-10-17 MED ORDER — FENTANYL CITRATE PF 50 MCG/ML IJ SOSY
50.0000 ug | PREFILLED_SYRINGE | Freq: Once | INTRAMUSCULAR | Status: AC
  Administered 2021-10-17: 50 ug via INTRAMUSCULAR
  Filled 2021-10-17: qty 1

## 2021-10-17 NOTE — ED Provider Notes (Signed)
[facility]  Provider Note  CSN: [id number] Arrival date & time: 10/17/21 0433  History Chief Complaint  Patient presents with   Generalized Body Aches    Emily [name] is a 46 y.o. female with history of fibromyalgia and neuropathy on Cymbalta and Gabapentin also diagnosed with shingles about a week ago and started on Valtrex. She has had severe pain since then, not controlled with her current home meds.    Home Medications Prior to Admission medications   Medication Sig Start Date End Date Taking? Authorizing Provider  HYDROcodone-acetaminophen (NORCO/VICODIN) 5-325 MG tablet Take 1 tablet by mouth every 6 (six) hours as needed for severe pain. 10/17/21  Yes Truddie Hidden, MD  albuterol (PROVENTIL HFA;VENTOLIN HFA) 108 (90 BASE) MCG/ACT inhaler Inhale 2 puffs into the lungs every 4 (four) hours as needed for wheezing.    [provider]  Ascorbic Acid (VITAMIN C PO) Take 1 tablet by mouth daily.    [provider]  aspirin EC 81 MG tablet Take 81 mg by mouth daily. Swallow whole. Patient not taking: Reported on 08/21/2021    [provider]  atorvastatin (LIPITOR) 20 MG tablet TAKE 1 TABLET BY MOUTH EVERY DAY Patient taking differently: Take 20 mg by mouth daily. 07/16/17   Binnie Rail, MD  blood glucose meter kit and supplies Dispense based on patient and insurance preference. Use up to four times daily as directed. (FOR ICD-10 E10.9, E11.9). 06/14/21   Aline August, MD  DULoxetine (CYMBALTA) 60 MG capsule Take 1 capsule (60 mg total) by mouth daily. 05/22/17   Binnie Rail, MD  gabapentin (NEURONTIN) 400 MG capsule Take 400 mg by mouth 2 (two) times daily. 03/09/21   [provider]  glimepiride (AMARYL) 2 MG tablet Take 1 tablet (2 mg total) by mouth every morning. 06/14/21 08/21/21  Aline August, MD  ibuprofen (ADVIL) 600 MG tablet Take 1 tablet (600 mg total) by mouth every 6 (six) hours as needed. Take  with food. Drink plenty of fluids/stay well hydrated. Patient taking differently: Take 600 mg by mouth every 6 (six) hours as needed for fever, mild pain or moderate pain. Take with food. Drink plenty of fluids/stay well hydrated. 05/10/21   Lajean Saver, MD  Insulin Glargine [facility] KWIKPEN) 100 UNIT/ML Inject 26 Units into the skin daily. 06/14/21   Aline August, MD  Insulin Pen Needle (PEN NEEDLES) 31G X 5 MM MISC Basaglar 26 units daily 06/14/21   Aline August, MD  loperamide (IMODIUM) 2 MG capsule Take 1 capsule (2 mg total) by mouth 4 (four) times daily as needed for diarrhea or loose stools. 03/29/16   Shary Decamp, PA-C  meclizine (ANTIVERT) 25 MG tablet Take 1 tablet (25 mg total) by mouth 3 (three) times daily as needed for dizziness. 04/02/16   Binnie Rail, MD  metFORMIN (GLUCOPHAGE) 1000 MG tablet Take 1 tablet (1,000 mg total) by mouth in the morning and at bedtime. 06/14/21   Aline August, MD  metoprolol tartrate (LOPRESSOR) 25 MG tablet TAKE 1 TABLET BY MOUTH EVERY DAY Patient taking differently: Take 25 mg by mouth daily. 07/16/17   Binnie Rail, MD  Omega-3 Fatty Acids (FISH OIL PO) Take 1 capsule by mouth daily. Patient not taking: Reported on 08/21/2021    [provider]  Phenylephrine-Cocoa Butter (PREPARATION H RE) Place 1 application. rectally 3 (three) times daily as needed (irritation/hemorrhoids). Patient not taking: Reported on 08/21/2021    [provider]  prednisoLONE acetate (PRED FORTE) 1 % ophthalmic suspension Place 1 drop into the left eye in the morning and at bedtime. Patient not taking: Reported on 08/21/2021 04/20/21   [provider]     Allergies    Methocarbamol and Zolpidem tartrate   Review of Systems   Review of Systems Please see HPI for pertinent positives and negatives  Physical Exam BP (!) 151/101   Pulse (!) 102   Temp 98.4 F (36.9 C)   Resp 18   Ht _0  (1.626 m)   Wt 74.4 kg   LMP 10/10/2021   SpO2 99%    BMI 28.15 kg/m   Physical Exam Vitals and nursing note reviewed.  HENT:     Head: Normocephalic.     Nose: Nose normal.  Eyes:     Extraocular Movements: Extraocular movements intact.  Pulmonary:     Effort: Pulmonary effort is normal.  Musculoskeletal:        General: Normal range of motion.     Cervical back: Neck supple.  Skin:    Findings: No rash (on exposed skin).     Comments: Zoster rash on mid abdomen and R flank  Neurological:     Mental Status: She is alert and oriented to person, place, and time.  Psychiatric:        Mood and Affect: Mood normal.     ED Results / Procedures / Treatments   EKG None  Procedures Procedures  Medications Ordered in the ED Medications  fentaNYL (SUBLIMAZE) injection 50 mcg (has no administration in time range)  ketorolac (TORADOL) 30 MG/ML injection 30 mg (has no administration in time range)    Initial Impression and Plan  Patient here with uncontrolled herpes zoster pain as well as her usual neuropathy pain. Advised to continue her home meds, will add short course of norco for pain. Fentanyl/Toradol while in the ED. Recommend PCP follow up for long term management. Advised zoster pain can last for months or years in some cases.  ED Course       MDM Rules/Calculators/A&P Medical Decision Making Problems Addressed: Post herpetic neuralgia: acute illness or injury  Risk Prescription drug management. Parenteral controlled substances.    Final Clinical Impression(s) / ED Diagnoses Final diagnoses:  Post herpetic neuralgia    Rx / DC Orders ED Discharge Orders          Ordered    HYDROcodone-acetaminophen (NORCO/VICODIN) 5-325 MG tablet  Every 6 hours PRN        10/17/21 5331             Truddie Hidden, MD 10/17/21 [phone]

## 2021-10-17 NOTE — ED Notes (Signed)
Pt verbalizes understanding of discharge instructions. Opportunity for questioning and answers were provided. Pt discharged from ED to home with mother.   ? ?

## 2021-10-17 NOTE — ED Triage Notes (Signed)
Pt c/o body pain and being unable to tolerate the pain. She states that she was diagnosed with shingles 1 week ago at [facility]. She states that she also has a hx of fibromyalgia and neuropathy

## 2021-11-20 ENCOUNTER — Encounter: Payer: No Typology Code available for payment source | Attending: Dietician | Admitting: Dietician

## 2022-01-22 ENCOUNTER — Ambulatory Visit: Payer: No Typology Code available for payment source

## 2022-01-22 DIAGNOSIS — Z23 Encounter for immunization: Secondary | ICD-10-CM

## 2022-12-01 ENCOUNTER — Ambulatory Visit: Payer: Medicaid Other

## 2022-12-01 DIAGNOSIS — Z23 Encounter for immunization: Secondary | ICD-10-CM
